# Patient Record
Sex: Female | Born: 1965
Health system: Southern US, Community
[De-identification: ages and names within clinical notes are randomized; demographics above are authoritative.]

## PROBLEM LIST (undated history)

## (undated) DIAGNOSIS — A64 Unspecified sexually transmitted disease: Secondary | ICD-10-CM

## (undated) DIAGNOSIS — Z800 Family history of malignant neoplasm of digestive organs: Secondary | ICD-10-CM

## (undated) DIAGNOSIS — Z803 Family history of malignant neoplasm of breast: Secondary | ICD-10-CM

## (undated) DIAGNOSIS — T7840XA Allergy, unspecified, initial encounter: Secondary | ICD-10-CM

## (undated) DIAGNOSIS — K859 Acute pancreatitis without necrosis or infection, unspecified: Secondary | ICD-10-CM

## (undated) DIAGNOSIS — E785 Hyperlipidemia, unspecified: Secondary | ICD-10-CM

## (undated) DIAGNOSIS — I493 Ventricular premature depolarization: Secondary | ICD-10-CM

## (undated) DIAGNOSIS — I10 Essential (primary) hypertension: Secondary | ICD-10-CM

## (undated) DIAGNOSIS — Z8042 Family history of malignant neoplasm of prostate: Secondary | ICD-10-CM

## (undated) DIAGNOSIS — D649 Anemia, unspecified: Secondary | ICD-10-CM

## (undated) DIAGNOSIS — Z8489 Family history of other specified conditions: Secondary | ICD-10-CM

## (undated) DIAGNOSIS — Z8041 Family history of malignant neoplasm of ovary: Secondary | ICD-10-CM

## (undated) DIAGNOSIS — E041 Nontoxic single thyroid nodule: Secondary | ICD-10-CM

## (undated) DIAGNOSIS — D126 Benign neoplasm of colon, unspecified: Secondary | ICD-10-CM

## (undated) DIAGNOSIS — I2510 Atherosclerotic heart disease of native coronary artery without angina pectoris: Secondary | ICD-10-CM

## (undated) DIAGNOSIS — K219 Gastro-esophageal reflux disease without esophagitis: Secondary | ICD-10-CM

## (undated) DIAGNOSIS — E039 Hypothyroidism, unspecified: Secondary | ICD-10-CM

## (undated) DIAGNOSIS — Z789 Other specified health status: Secondary | ICD-10-CM

## (undated) DIAGNOSIS — Z8051 Family history of malignant neoplasm of kidney: Secondary | ICD-10-CM

## (undated) HISTORY — DX: Family history of malignant neoplasm of digestive organs: Z80.0

## (undated) HISTORY — DX: Other specified health status: Z78.9

## (undated) HISTORY — DX: Family history of malignant neoplasm of prostate: Z80.42

## (undated) HISTORY — DX: Hyperlipidemia, unspecified: E78.5

## (undated) HISTORY — DX: Family history of malignant neoplasm of kidney: Z80.51

## (undated) HISTORY — DX: Nontoxic single thyroid nodule: E04.1

## (undated) HISTORY — PX: COLONOSCOPY: SHX174

## (undated) HISTORY — PX: UPPER GASTROINTESTINAL ENDOSCOPY: SHX188

## (undated) HISTORY — DX: Anemia, unspecified: D64.9

## (undated) HISTORY — DX: Unspecified sexually transmitted disease: A64

## (undated) HISTORY — PX: VENTRICULAR ABLATION SURGERY: SHX835

## (undated) HISTORY — DX: Family history of malignant neoplasm of ovary: Z80.41

## (undated) HISTORY — DX: Ventricular premature depolarization: I49.3

## (undated) HISTORY — DX: Essential (primary) hypertension: I10

## (undated) HISTORY — PX: CORONARY STENT PLACEMENT: SHX6853

## (undated) HISTORY — DX: Allergy, unspecified, initial encounter: T78.40XA

## (undated) HISTORY — DX: Benign neoplasm of colon, unspecified: D12.6

## (undated) HISTORY — DX: Acute pancreatitis without necrosis or infection, unspecified: K85.90

## (undated) HISTORY — PX: CARDIAC CATHETERIZATION: SHX172

## (undated) HISTORY — DX: Family history of malignant neoplasm of breast: Z80.3

## (undated) HISTORY — PX: POLYPECTOMY: SHX149

## (undated) HISTORY — DX: Gastro-esophageal reflux disease without esophagitis: K21.9

## (undated) HISTORY — DX: Atherosclerotic heart disease of native coronary artery without angina pectoris: I25.10

---
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
## 1993-09-26 HISTORY — PX: TUBAL LIGATION: SHX77

## 1999-04-13 ENCOUNTER — Emergency Department (HOSPITAL_COMMUNITY): Admission: EM | Admit: 1999-04-13 | Discharge: 1999-04-13 | Payer: Self-pay | Admitting: Emergency Medicine

## 1999-04-13 ENCOUNTER — Encounter: Payer: Self-pay | Admitting: Emergency Medicine

## 2000-04-13 ENCOUNTER — Encounter: Payer: Self-pay | Admitting: Internal Medicine

## 2000-04-13 ENCOUNTER — Encounter: Admission: RE | Admit: 2000-04-13 | Discharge: 2000-04-13 | Payer: Self-pay | Admitting: Internal Medicine

## 2000-04-14 ENCOUNTER — Ambulatory Visit (HOSPITAL_COMMUNITY): Admission: RE | Admit: 2000-04-14 | Discharge: 2000-04-14 | Payer: Self-pay | Admitting: Internal Medicine

## 2000-06-28 ENCOUNTER — Emergency Department (HOSPITAL_COMMUNITY): Admission: EM | Admit: 2000-06-28 | Discharge: 2000-06-28 | Payer: Self-pay | Admitting: *Deleted

## 2000-08-16 ENCOUNTER — Other Ambulatory Visit: Admission: RE | Admit: 2000-08-16 | Discharge: 2000-08-16 | Payer: Self-pay | Admitting: Obstetrics and Gynecology

## 2002-02-03 ENCOUNTER — Emergency Department (HOSPITAL_COMMUNITY): Admission: EM | Admit: 2002-02-03 | Discharge: 2002-02-03 | Payer: Self-pay | Admitting: Emergency Medicine

## 2002-05-29 ENCOUNTER — Emergency Department (HOSPITAL_COMMUNITY): Admission: EM | Admit: 2002-05-29 | Discharge: 2002-05-29 | Payer: Self-pay | Admitting: Emergency Medicine

## 2002-05-29 ENCOUNTER — Encounter: Payer: Self-pay | Admitting: Emergency Medicine

## 2003-01-26 ENCOUNTER — Encounter: Payer: Self-pay | Admitting: Emergency Medicine

## 2003-01-26 ENCOUNTER — Emergency Department (HOSPITAL_COMMUNITY): Admission: AD | Admit: 2003-01-26 | Discharge: 2003-01-26 | Payer: Self-pay | Admitting: Emergency Medicine

## 2003-02-04 ENCOUNTER — Observation Stay (HOSPITAL_COMMUNITY): Admission: EM | Admit: 2003-02-04 | Discharge: 2003-02-05 | Payer: Self-pay | Admitting: Emergency Medicine

## 2003-02-04 ENCOUNTER — Encounter: Payer: Self-pay | Admitting: Emergency Medicine

## 2003-06-20 ENCOUNTER — Emergency Department (HOSPITAL_COMMUNITY): Admission: EM | Admit: 2003-06-20 | Discharge: 2003-06-20 | Payer: Self-pay | Admitting: Emergency Medicine

## 2003-06-20 ENCOUNTER — Encounter: Payer: Self-pay | Admitting: Emergency Medicine

## 2003-06-23 ENCOUNTER — Emergency Department (HOSPITAL_COMMUNITY): Admission: EM | Admit: 2003-06-23 | Discharge: 2003-06-23 | Payer: Self-pay | Admitting: Emergency Medicine

## 2003-10-31 ENCOUNTER — Other Ambulatory Visit: Admission: RE | Admit: 2003-10-31 | Discharge: 2003-10-31 | Payer: Self-pay | Admitting: Internal Medicine

## 2003-11-04 ENCOUNTER — Encounter: Admission: RE | Admit: 2003-11-04 | Discharge: 2003-11-04 | Payer: Self-pay | Admitting: Internal Medicine

## 2003-11-21 ENCOUNTER — Encounter: Admission: RE | Admit: 2003-11-21 | Discharge: 2003-11-21 | Payer: Self-pay | Admitting: Internal Medicine

## 2004-09-10 ENCOUNTER — Inpatient Hospital Stay (HOSPITAL_COMMUNITY): Admission: EM | Admit: 2004-09-10 | Discharge: 2004-09-14 | Payer: Self-pay | Admitting: Emergency Medicine

## 2004-09-10 ENCOUNTER — Ambulatory Visit: Payer: Self-pay | Admitting: Internal Medicine

## 2004-09-29 ENCOUNTER — Ambulatory Visit: Payer: Self-pay | Admitting: Internal Medicine

## 2004-10-06 ENCOUNTER — Emergency Department (HOSPITAL_COMMUNITY): Admission: EM | Admit: 2004-10-06 | Discharge: 2004-10-06 | Payer: Self-pay | Admitting: Family Medicine

## 2005-06-16 ENCOUNTER — Emergency Department (HOSPITAL_COMMUNITY): Admission: EM | Admit: 2005-06-16 | Discharge: 2005-06-17 | Payer: Self-pay | Admitting: Emergency Medicine

## 2005-07-24 ENCOUNTER — Emergency Department (HOSPITAL_COMMUNITY): Admission: EM | Admit: 2005-07-24 | Discharge: 2005-07-24 | Payer: Self-pay | Admitting: Emergency Medicine

## 2005-07-25 ENCOUNTER — Ambulatory Visit: Payer: Self-pay | Admitting: Internal Medicine

## 2005-08-13 ENCOUNTER — Emergency Department (HOSPITAL_COMMUNITY): Admission: EM | Admit: 2005-08-13 | Discharge: 2005-08-13 | Payer: Self-pay | Admitting: Emergency Medicine

## 2005-08-13 ENCOUNTER — Emergency Department (HOSPITAL_COMMUNITY): Admission: EM | Admit: 2005-08-13 | Discharge: 2005-08-13 | Payer: Self-pay | Admitting: Family Medicine

## 2005-08-15 ENCOUNTER — Emergency Department (HOSPITAL_COMMUNITY): Admission: EM | Admit: 2005-08-15 | Discharge: 2005-08-15 | Payer: Self-pay | Admitting: Family Medicine

## 2005-08-16 ENCOUNTER — Emergency Department (HOSPITAL_COMMUNITY): Admission: EM | Admit: 2005-08-16 | Discharge: 2005-08-16 | Payer: Self-pay | Admitting: Family Medicine

## 2005-09-23 ENCOUNTER — Ambulatory Visit: Payer: Self-pay | Admitting: Internal Medicine

## 2005-11-15 ENCOUNTER — Ambulatory Visit: Payer: Self-pay | Admitting: Internal Medicine

## 2005-11-23 ENCOUNTER — Ambulatory Visit: Payer: Self-pay | Admitting: Internal Medicine

## 2005-12-03 ENCOUNTER — Emergency Department (HOSPITAL_COMMUNITY): Admission: EM | Admit: 2005-12-03 | Discharge: 2005-12-03 | Payer: Self-pay | Admitting: Family Medicine

## 2006-04-22 ENCOUNTER — Emergency Department (HOSPITAL_COMMUNITY): Admission: EM | Admit: 2006-04-22 | Discharge: 2006-04-22 | Payer: Self-pay | Admitting: Emergency Medicine

## 2006-06-16 ENCOUNTER — Emergency Department (HOSPITAL_COMMUNITY): Admission: EM | Admit: 2006-06-16 | Discharge: 2006-06-16 | Payer: Self-pay | Admitting: Family Medicine

## 2006-06-17 ENCOUNTER — Emergency Department (HOSPITAL_COMMUNITY): Admission: EM | Admit: 2006-06-17 | Discharge: 2006-06-17 | Payer: Self-pay | Admitting: Emergency Medicine

## 2006-06-22 ENCOUNTER — Ambulatory Visit: Payer: Self-pay | Admitting: Internal Medicine

## 2006-06-23 ENCOUNTER — Encounter: Admission: RE | Admit: 2006-06-23 | Discharge: 2006-06-23 | Payer: Self-pay | Admitting: Internal Medicine

## 2006-08-22 ENCOUNTER — Encounter (INDEPENDENT_AMBULATORY_CARE_PROVIDER_SITE_OTHER): Payer: Self-pay | Admitting: Specialist

## 2006-08-22 ENCOUNTER — Ambulatory Visit (HOSPITAL_COMMUNITY): Admission: RE | Admit: 2006-08-22 | Discharge: 2006-08-22 | Payer: Self-pay | Admitting: *Deleted

## 2006-08-23 ENCOUNTER — Emergency Department (HOSPITAL_COMMUNITY): Admission: EM | Admit: 2006-08-23 | Discharge: 2006-08-24 | Payer: Self-pay | Admitting: Emergency Medicine

## 2006-09-05 ENCOUNTER — Emergency Department (HOSPITAL_COMMUNITY): Admission: EM | Admit: 2006-09-05 | Discharge: 2006-09-06 | Payer: Self-pay | Admitting: Emergency Medicine

## 2006-09-06 ENCOUNTER — Encounter: Admission: RE | Admit: 2006-09-06 | Discharge: 2006-09-06 | Payer: Self-pay | Admitting: *Deleted

## 2006-09-26 HISTORY — PX: LAPAROSCOPIC CHOLECYSTECTOMY: SUR755

## 2006-12-26 ENCOUNTER — Emergency Department (HOSPITAL_COMMUNITY): Admission: EM | Admit: 2006-12-26 | Discharge: 2006-12-26 | Payer: Self-pay | Admitting: Family Medicine

## 2006-12-28 ENCOUNTER — Emergency Department (HOSPITAL_COMMUNITY): Admission: EM | Admit: 2006-12-28 | Discharge: 2006-12-28 | Payer: Self-pay | Admitting: Family Medicine

## 2007-01-03 ENCOUNTER — Ambulatory Visit: Payer: Self-pay | Admitting: Internal Medicine

## 2007-04-06 ENCOUNTER — Ambulatory Visit: Payer: Self-pay | Admitting: Internal Medicine

## 2007-04-09 ENCOUNTER — Emergency Department (HOSPITAL_COMMUNITY): Admission: EM | Admit: 2007-04-09 | Discharge: 2007-04-09 | Payer: Self-pay | Admitting: Family Medicine

## 2007-04-24 DIAGNOSIS — I10 Essential (primary) hypertension: Secondary | ICD-10-CM

## 2007-07-18 ENCOUNTER — Ambulatory Visit: Payer: Self-pay | Admitting: Internal Medicine

## 2007-07-18 LAB — CONVERTED CEMR LAB
Basophils Absolute: 0 10*3/uL (ref 0.0–0.1)
Bilirubin Urine: NEGATIVE
CO2: 29 meq/L (ref 19–32)
Calcium: 9.2 mg/dL (ref 8.4–10.5)
Chloride: 106 meq/L (ref 96–112)
Creatinine, Ser: 0.8 mg/dL (ref 0.4–1.2)
Eosinophils Absolute: 0.4 10*3/uL (ref 0.0–0.6)
Eosinophils Relative: 5.3 % — ABNORMAL HIGH (ref 0.0–5.0)
Glucose, Bld: 107 mg/dL — ABNORMAL HIGH (ref 70–99)
HCT: 40.2 % (ref 36.0–46.0)
Hemoglobin, Urine: NEGATIVE
Hemoglobin: 13.9 g/dL (ref 12.0–15.0)
MCHC: 34.7 g/dL (ref 30.0–36.0)
MCV: 89.4 fL (ref 78.0–100.0)
Monocytes Absolute: 0.5 10*3/uL (ref 0.2–0.7)
Neutrophils Relative %: 53.9 % (ref 43.0–77.0)
Nitrite: NEGATIVE
RBC: 4.49 M/uL (ref 3.87–5.11)
RDW: 12.6 % (ref 11.5–14.6)
Sodium: 139 meq/L (ref 135–145)
Total Protein, Urine: NEGATIVE mg/dL
WBC: 6.8 10*3/uL (ref 4.5–10.5)

## 2007-07-19 ENCOUNTER — Telehealth: Payer: Self-pay | Admitting: Internal Medicine

## 2007-08-28 ENCOUNTER — Ambulatory Visit: Payer: Self-pay | Admitting: Internal Medicine

## 2007-08-28 DIAGNOSIS — R1310 Dysphagia, unspecified: Secondary | ICD-10-CM | POA: Insufficient documentation

## 2007-08-28 DIAGNOSIS — R0602 Shortness of breath: Secondary | ICD-10-CM | POA: Insufficient documentation

## 2007-08-29 ENCOUNTER — Ambulatory Visit: Payer: Self-pay | Admitting: Internal Medicine

## 2007-08-30 LAB — CONVERTED CEMR LAB
BUN: 8 mg/dL (ref 6–23)
Basophils Absolute: 0 10*3/uL (ref 0.0–0.1)
Basophils Relative: 0.4 % (ref 0.0–1.0)
CO2: 29 meq/L (ref 19–32)
Eosinophils Absolute: 0.3 10*3/uL (ref 0.0–0.6)
GFR calc Af Amer: 102 mL/min
GFR calc non Af Amer: 84 mL/min
Hemoglobin: 13.5 g/dL (ref 12.0–15.0)
Lymphocytes Relative: 35 % (ref 12.0–46.0)
MCHC: 34.3 g/dL (ref 30.0–36.0)
MCV: 89.4 fL (ref 78.0–100.0)
Monocytes Absolute: 0.6 10*3/uL (ref 0.2–0.7)
Monocytes Relative: 6.8 % (ref 3.0–11.0)
Neutro Abs: 4.8 10*3/uL (ref 1.4–7.7)
Potassium: 4.3 meq/L (ref 3.5–5.1)

## 2007-09-17 ENCOUNTER — Telehealth: Payer: Self-pay | Admitting: Internal Medicine

## 2007-09-17 DIAGNOSIS — K219 Gastro-esophageal reflux disease without esophagitis: Secondary | ICD-10-CM | POA: Insufficient documentation

## 2007-09-18 ENCOUNTER — Encounter (INDEPENDENT_AMBULATORY_CARE_PROVIDER_SITE_OTHER): Payer: Self-pay | Admitting: *Deleted

## 2007-09-23 ENCOUNTER — Emergency Department (HOSPITAL_COMMUNITY): Admission: EM | Admit: 2007-09-23 | Discharge: 2007-09-23 | Payer: Self-pay | Admitting: Emergency Medicine

## 2007-09-27 DIAGNOSIS — I2510 Atherosclerotic heart disease of native coronary artery without angina pectoris: Secondary | ICD-10-CM

## 2007-09-27 HISTORY — DX: Atherosclerotic heart disease of native coronary artery without angina pectoris: I25.10

## 2007-09-28 ENCOUNTER — Ambulatory Visit: Payer: Self-pay | Admitting: Internal Medicine

## 2007-09-28 LAB — CONVERTED CEMR LAB: TSH: 1.47 microintl units/mL (ref 0.35–5.50)

## 2007-10-02 ENCOUNTER — Ambulatory Visit: Payer: Self-pay | Admitting: Internal Medicine

## 2007-10-02 DIAGNOSIS — R1031 Right lower quadrant pain: Secondary | ICD-10-CM

## 2008-01-31 ENCOUNTER — Ambulatory Visit: Payer: Self-pay | Admitting: Internal Medicine

## 2008-01-31 ENCOUNTER — Telehealth: Payer: Self-pay | Admitting: Internal Medicine

## 2008-01-31 DIAGNOSIS — M549 Dorsalgia, unspecified: Secondary | ICD-10-CM | POA: Insufficient documentation

## 2008-01-31 LAB — CONVERTED CEMR LAB
BUN: 9 mg/dL (ref 6–23)
Chloride: 105 meq/L (ref 96–112)
Glucose, Bld: 95 mg/dL (ref 70–99)
Ketones, ur: NEGATIVE mg/dL
Potassium: 3.7 meq/L (ref 3.5–5.1)
Specific Gravity, Urine: 1.01 (ref 1.000–1.03)
Urine Glucose: NEGATIVE mg/dL
Urobilinogen, UA: 0.2 (ref 0.0–1.0)

## 2008-02-05 ENCOUNTER — Telehealth: Payer: Self-pay | Admitting: Internal Medicine

## 2008-02-08 ENCOUNTER — Ambulatory Visit: Payer: Self-pay | Admitting: Internal Medicine

## 2008-02-08 ENCOUNTER — Emergency Department (HOSPITAL_COMMUNITY): Admission: EM | Admit: 2008-02-08 | Discharge: 2008-02-08 | Payer: Self-pay | Admitting: Emergency Medicine

## 2008-02-08 LAB — CONVERTED CEMR LAB
Bacteria, UA: NEGATIVE
Ketones, ur: NEGATIVE mg/dL
Mucus, UA: NEGATIVE
RBC / HPF: NONE SEEN
Specific Gravity, Urine: 1.02 (ref 1.000–1.03)
Urine Glucose: NEGATIVE mg/dL
pH: 6 (ref 5.0–8.0)

## 2008-02-20 ENCOUNTER — Encounter: Payer: Self-pay | Admitting: Endocrinology

## 2008-04-18 ENCOUNTER — Ambulatory Visit: Payer: Self-pay | Admitting: Internal Medicine

## 2008-04-18 ENCOUNTER — Inpatient Hospital Stay (HOSPITAL_COMMUNITY): Admission: EM | Admit: 2008-04-18 | Discharge: 2008-04-20 | Payer: Self-pay | Admitting: Emergency Medicine

## 2008-06-30 ENCOUNTER — Encounter: Payer: Self-pay | Admitting: Endocrinology

## 2008-07-02 ENCOUNTER — Encounter: Payer: Self-pay | Admitting: Endocrinology

## 2008-08-08 ENCOUNTER — Ambulatory Visit: Payer: Self-pay | Admitting: Internal Medicine

## 2008-08-08 LAB — CONVERTED CEMR LAB
ALT: 17 units/L (ref 0–35)
AST: 18 units/L (ref 0–37)
Albumin: 3.9 g/dL (ref 3.5–5.2)
BUN: 12 mg/dL (ref 6–23)
Basophils Relative: 0 % (ref 0.0–3.0)
Bilirubin Urine: NEGATIVE
CO2: 30 meq/L (ref 19–32)
Calcium: 9.4 mg/dL (ref 8.4–10.5)
Chloride: 105 meq/L (ref 96–112)
Cholesterol: 160 mg/dL (ref 0–200)
Creatinine, Ser: 0.8 mg/dL (ref 0.4–1.2)
Eosinophils Relative: 5 % (ref 0.0–5.0)
Hemoglobin: 12.9 g/dL (ref 12.0–15.0)
Ketones, urine, test strip: NEGATIVE
LDL Cholesterol: 100 mg/dL — ABNORMAL HIGH (ref 0–99)
Lipase: 19 units/L (ref 11.0–59.0)
Lymphocytes Relative: 40 % (ref 12.0–46.0)
MCV: 89.1 fL (ref 78.0–100.0)
Neutro Abs: 4 10*3/uL (ref 1.4–7.7)
Neutrophils Relative %: 49.6 % (ref 43.0–77.0)
Nitrite: NEGATIVE
RBC: 4.19 M/uL (ref 3.87–5.11)
Total Protein: 7.3 g/dL (ref 6.0–8.3)
VLDL: 16 mg/dL (ref 0–40)
WBC Urine, dipstick: NEGATIVE
WBC: 8 10*3/uL (ref 4.5–10.5)
pH: 6

## 2008-08-10 ENCOUNTER — Encounter: Payer: Self-pay | Admitting: Internal Medicine

## 2008-09-23 ENCOUNTER — Telehealth: Payer: Self-pay | Admitting: Internal Medicine

## 2008-09-26 DIAGNOSIS — E041 Nontoxic single thyroid nodule: Secondary | ICD-10-CM

## 2008-09-26 HISTORY — DX: Nontoxic single thyroid nodule: E04.1

## 2008-10-02 ENCOUNTER — Encounter: Payer: Self-pay | Admitting: Internal Medicine

## 2008-10-06 ENCOUNTER — Encounter: Payer: Self-pay | Admitting: Internal Medicine

## 2008-10-16 ENCOUNTER — Encounter: Payer: Self-pay | Admitting: Endocrinology

## 2008-10-17 ENCOUNTER — Encounter: Payer: Self-pay | Admitting: Endocrinology

## 2008-11-15 ENCOUNTER — Emergency Department (HOSPITAL_COMMUNITY): Admission: EM | Admit: 2008-11-15 | Discharge: 2008-11-15 | Payer: Self-pay | Admitting: Emergency Medicine

## 2008-11-17 ENCOUNTER — Ambulatory Visit: Payer: Self-pay | Admitting: Endocrinology

## 2008-12-15 ENCOUNTER — Telehealth: Payer: Self-pay | Admitting: Internal Medicine

## 2008-12-31 ENCOUNTER — Ambulatory Visit: Payer: Self-pay | Admitting: Internal Medicine

## 2008-12-31 DIAGNOSIS — M79609 Pain in unspecified limb: Secondary | ICD-10-CM

## 2008-12-31 DIAGNOSIS — R209 Unspecified disturbances of skin sensation: Secondary | ICD-10-CM

## 2008-12-31 DIAGNOSIS — E785 Hyperlipidemia, unspecified: Secondary | ICD-10-CM

## 2008-12-31 DIAGNOSIS — Z8719 Personal history of other diseases of the digestive system: Secondary | ICD-10-CM | POA: Insufficient documentation

## 2008-12-31 DIAGNOSIS — R609 Edema, unspecified: Secondary | ICD-10-CM

## 2008-12-31 DIAGNOSIS — Z87898 Personal history of other specified conditions: Secondary | ICD-10-CM | POA: Insufficient documentation

## 2008-12-31 LAB — CONVERTED CEMR LAB: Vit D, 25-Hydroxy: 18 ng/mL — ABNORMAL LOW (ref 30–89)

## 2009-01-02 LAB — CONVERTED CEMR LAB
ALT: 15 units/L (ref 0–35)
AST: 20 units/L (ref 0–37)
Bilirubin Urine: NEGATIVE
CO2: 30 meq/L (ref 19–32)
Chloride: 106 meq/L (ref 96–112)
Creatinine, Ser: 0.9 mg/dL (ref 0.4–1.2)
Glucose, Bld: 103 mg/dL — ABNORMAL HIGH (ref 70–99)
Hemoglobin, Urine: NEGATIVE
Ketones, ur: NEGATIVE mg/dL
TSH: 0.31 microintl units/mL — ABNORMAL LOW (ref 0.35–5.50)
Total Bilirubin: 0.7 mg/dL (ref 0.3–1.2)
Total Protein, Urine: NEGATIVE mg/dL
Urine Glucose: NEGATIVE mg/dL

## 2009-01-07 ENCOUNTER — Telehealth (INDEPENDENT_AMBULATORY_CARE_PROVIDER_SITE_OTHER): Payer: Self-pay | Admitting: *Deleted

## 2009-02-19 ENCOUNTER — Encounter (INDEPENDENT_AMBULATORY_CARE_PROVIDER_SITE_OTHER): Payer: Self-pay | Admitting: *Deleted

## 2009-03-06 ENCOUNTER — Ambulatory Visit: Payer: Self-pay | Admitting: Internal Medicine

## 2009-03-11 ENCOUNTER — Encounter: Payer: Self-pay | Admitting: Endocrinology

## 2009-03-17 ENCOUNTER — Ambulatory Visit: Payer: Self-pay | Admitting: Internal Medicine

## 2009-04-30 ENCOUNTER — Encounter: Payer: Self-pay | Admitting: Endocrinology

## 2009-05-04 ENCOUNTER — Ambulatory Visit: Payer: Self-pay | Admitting: Endocrinology

## 2009-05-04 DIAGNOSIS — E041 Nontoxic single thyroid nodule: Secondary | ICD-10-CM | POA: Insufficient documentation

## 2009-05-11 ENCOUNTER — Encounter (HOSPITAL_COMMUNITY): Admission: RE | Admit: 2009-05-11 | Discharge: 2009-06-25 | Payer: Self-pay | Admitting: Endocrinology

## 2009-05-27 ENCOUNTER — Ambulatory Visit: Payer: Self-pay | Admitting: Internal Medicine

## 2009-05-27 DIAGNOSIS — M545 Low back pain: Secondary | ICD-10-CM

## 2009-05-27 LAB — CONVERTED CEMR LAB
BUN: 9 mg/dL (ref 6–23)
Basophils Absolute: 0.1 10*3/uL (ref 0.0–0.1)
Bilirubin Urine: NEGATIVE
Bilirubin, Direct: 0.1 mg/dL (ref 0.0–0.3)
CO2: 30 meq/L (ref 19–32)
Chloride: 108 meq/L (ref 96–112)
Creatinine, Ser: 1 mg/dL (ref 0.4–1.2)
Eosinophils Absolute: 0.2 10*3/uL (ref 0.0–0.7)
Leukocytes, UA: NEGATIVE
MCHC: 33.3 g/dL (ref 30.0–36.0)
MCV: 93.8 fL (ref 78.0–100.0)
Monocytes Absolute: 0.3 10*3/uL (ref 0.1–1.0)
Neutrophils Relative %: 56.1 % (ref 43.0–77.0)
Nitrite: NEGATIVE
Platelets: 239 10*3/uL (ref 150.0–400.0)
Sed Rate: 21 mm/hr (ref 0–22)
Specific Gravity, Urine: 1.02 (ref 1.000–1.030)
Total Bilirubin: 0.9 mg/dL (ref 0.3–1.2)
WBC: 7.1 10*3/uL (ref 4.5–10.5)
pH: 6 (ref 5.0–8.0)

## 2009-06-12 ENCOUNTER — Encounter: Payer: Self-pay | Admitting: Endocrinology

## 2009-06-12 ENCOUNTER — Telehealth (INDEPENDENT_AMBULATORY_CARE_PROVIDER_SITE_OTHER): Payer: Self-pay | Admitting: *Deleted

## 2009-06-26 ENCOUNTER — Ambulatory Visit (HOSPITAL_COMMUNITY): Admission: RE | Admit: 2009-06-26 | Discharge: 2009-06-26 | Payer: Self-pay | Admitting: Endocrinology

## 2009-09-26 DIAGNOSIS — K859 Acute pancreatitis without necrosis or infection, unspecified: Secondary | ICD-10-CM

## 2009-09-26 HISTORY — DX: Acute pancreatitis without necrosis or infection, unspecified: K85.90

## 2009-10-22 ENCOUNTER — Ambulatory Visit: Payer: Self-pay | Admitting: Internal Medicine

## 2009-10-22 ENCOUNTER — Encounter: Payer: Self-pay | Admitting: Internal Medicine

## 2009-10-23 ENCOUNTER — Telehealth: Payer: Self-pay | Admitting: Internal Medicine

## 2010-05-20 ENCOUNTER — Ambulatory Visit: Payer: Self-pay | Admitting: Endocrinology

## 2010-06-10 ENCOUNTER — Encounter: Payer: Self-pay | Admitting: Internal Medicine

## 2010-06-11 ENCOUNTER — Encounter: Payer: Self-pay | Admitting: Internal Medicine

## 2010-06-16 ENCOUNTER — Ambulatory Visit: Payer: Self-pay | Admitting: Internal Medicine

## 2010-06-16 DIAGNOSIS — R7309 Other abnormal glucose: Secondary | ICD-10-CM | POA: Insufficient documentation

## 2010-06-16 DIAGNOSIS — K859 Acute pancreatitis without necrosis or infection, unspecified: Secondary | ICD-10-CM

## 2010-06-16 LAB — CONVERTED CEMR LAB
ALT: 13 units/L (ref 0–35)
AST: 14 units/L (ref 0–37)
Alkaline Phosphatase: 44 units/L (ref 39–117)
BUN: 9 mg/dL (ref 6–23)
Bilirubin, Direct: 0.2 mg/dL (ref 0.0–0.3)
Calcium: 9.6 mg/dL (ref 8.4–10.5)
Eosinophils Relative: 3.3 % (ref 0.0–5.0)
GFR calc non Af Amer: 109.54 mL/min (ref >60)
HCT: 40.3 % (ref 36.0–46.0)
Lipase: 41 units/L (ref 11.0–59.0)
Lymphocytes Relative: 37.6 % (ref 12.0–46.0)
Lymphs Abs: 2.6 10*3/uL (ref 0.7–4.0)
Monocytes Relative: 4.5 % (ref 3.0–12.0)
Platelets: 251 10*3/uL (ref 150.0–400.0)
Potassium: 4.9 meq/L (ref 3.5–5.1)
Sodium: 140 meq/L (ref 135–145)
Total Bilirubin: 0.9 mg/dL (ref 0.3–1.2)
WBC: 6.8 10*3/uL (ref 4.5–10.5)

## 2010-06-30 ENCOUNTER — Telehealth (INDEPENDENT_AMBULATORY_CARE_PROVIDER_SITE_OTHER): Payer: Self-pay | Admitting: *Deleted

## 2010-09-09 ENCOUNTER — Ambulatory Visit: Payer: Self-pay | Admitting: Internal Medicine

## 2010-09-10 ENCOUNTER — Telehealth: Payer: Self-pay | Admitting: Internal Medicine

## 2010-09-13 LAB — CONVERTED CEMR LAB
BUN: 11 mg/dL (ref 6–23)
Bilirubin Urine: NEGATIVE
Bilirubin, Direct: 0.1 mg/dL (ref 0.0–0.3)
CO2: 29 meq/L (ref 19–32)
Chloride: 105 meq/L (ref 96–112)
Creatinine, Ser: 0.7 mg/dL (ref 0.4–1.2)
Eosinophils Absolute: 0.2 10*3/uL (ref 0.0–0.7)
Glucose, Bld: 95 mg/dL (ref 70–99)
HCT: 40.2 % (ref 36.0–46.0)
Ketones, ur: NEGATIVE mg/dL
Leukocytes, UA: NEGATIVE
Lipase: 29 units/L (ref 11.0–59.0)
Lymphs Abs: 2.5 10*3/uL (ref 0.7–4.0)
MCHC: 33.8 g/dL (ref 30.0–36.0)
MCV: 93.5 fL (ref 78.0–100.0)
Monocytes Absolute: 0.4 10*3/uL (ref 0.1–1.0)
Neutrophils Relative %: 56.5 % (ref 43.0–77.0)
Nitrite: NEGATIVE
Platelets: 262 10*3/uL (ref 150.0–400.0)
RDW: 13.4 % (ref 11.5–14.6)
Specific Gravity, Urine: 1.02 (ref 1.000–1.030)
TSH: 2.33 microintl units/mL (ref 0.35–5.50)
Total Bilirubin: 0.8 mg/dL (ref 0.3–1.2)
Urobilinogen, UA: 1 (ref 0.0–1.0)
pH: 6 (ref 5.0–8.0)

## 2010-10-17 ENCOUNTER — Encounter: Payer: Self-pay | Admitting: Internal Medicine

## 2010-10-26 NOTE — Assessment & Plan Note (Signed)
Summary: DR AVP PT NO SLOT-COUGH CHEST CONGESTION STC   Vital Signs:  Patient profile:   45 year old female Height:      62 inches Weight:      177 pounds BMI:     32.49 O2 Sat:      98 % on Room air Temp:     97.9 degrees F oral Pulse rate:   112 / minute Pulse rhythm:   regular Resp:     16 per minute BP sitting:   140 / 88  (left arm) Cuff size:   large  Vitals Entered By: Rock Nephew CMA (October 22, 2009 1:58 PM)  O2 Flow:  Room air  Primary Care Provider:  Georgina Quint Plotnikov MD  CC:  URI symptoms.  History of Present Illness:  URI Symptoms      This is a 45 year old woman who presents with URI symptoms.  The symptoms began 1 week ago.  The severity is described as moderate.  The patient reports nasal congestion, purulent nasal discharge, sore throat, productive cough, and sick contacts, but denies earache.  Associated symptoms include fever of 100.5-103 degrees, use of an antipyretic, and response to antipyretic.  The patient denies stiff neck, dyspnea, wheezing, rash, vomiting, and diarrhea.  The patient also reports headache, muscle aches, and severe fatigue.  The patient denies the following risk factors for Strep sinusitis: Strep exposure, tender adenopathy, and absence of cough.    Preventive Screening-Counseling & Management  Alcohol-Tobacco     Alcohol drinks/day: 0     Smoking Status: never     Passive Smoke Exposure: no  Caffeine-Diet-Exercise     Does Patient Exercise: yes  Hep-HIV-STD-Contraception     HIV Risk: no      Drug Use:  no.    Medications Prior to Update: 1)  Diltiazem Hcl Er Beads 120 Mg  Cp24 (Diltiazem Hcl Er Beads) .... Take 1 Tablet By Mouth Once A Day 2)  Atenolol 50 Mg Tabs (Atenolol) 3)  Vitamin D3 1000 Unit  Tabs (Cholecalciferol) .Marland Kitchen.. 1 By Mouth Daily 4)  Veramyst 27.5 Mcg/spray Susp (Fluticasone Furoate) 5)  Promethazine Hcl 25 Mg  Tabs (Promethazine Hcl) .Marland Kitchen.. 1 By Mouth Two Times A Day As Needed Nausea 6)   Hydrocodone-Acetaminophen 10-325 Mg Tabs (Hydrocodone-Acetaminophen) .... 1/2 or 1 By Mouth Up To 4 Time Per Day As Needed For Pain  Current Medications (verified): 1)  Atenolol 50 Mg Tabs (Atenolol) 2)  Vitamin D3 1000 Unit  Tabs (Cholecalciferol) .Marland Kitchen.. 1 By Mouth Daily 3)  Veramyst 27.5 Mcg/spray Susp (Fluticasone Furoate) 4)  Promethazine Hcl 25 Mg  Tabs (Promethazine Hcl) .Marland Kitchen.. 1 By Mouth Two Times A Day As Needed Nausea  Allergies (verified): 1)  Lipitor  Past History:  Past Medical History: Reviewed history from 12/31/2008 and no changes required. Hypertension gyn  Dr Allena Katz in [facility] Heart cath 2009 - CAD Dr Linton Ham - [facility] Coronary artery disease Hyperlipidemia Statin intol  Past Surgical History: Reviewed history from 04/24/2007 and no changes required. Tubal ligation  Family History: Reviewed history from 08/28/2007 and no changes required. Family History Hypertension Family History of Prostate CA 1st degree relative <50 D, S ovar.  CA  Social History: Reviewed history from 08/08/2008 and no changes required. 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    Physical Exam  General:  alert, well-developed, well-nourished, well-hydrated, and mild distress with cough.   Head:  normocephalic, atraumatic, no abnormalities observed, and no abnormalities palpated.   Eyes:  vision grossly intact, pupils equal, pupils round, and pupils reactive to light.   Ears:  R ear normal and L ear normal.   Nose:  nasal discharge, mucosal erythema, and mucosal edema.  no airflow obstruction, no intranasal foreign body, no nasal polyps, no nasal mucosal lesions, no mucosal friability, no active bleeding or clots, nasal dischargemucosal pallor,  mucosal erythema, and mucosal edema.   Mouth:  good dentition, no gingival abnormalities, no exudates, no posterior lymphoid hypertrophy, no postnasal drip, no pharyngeal crowing, no lesions, no erosions, no leukoplakia, no petechiae, and pharyngeal erythema.   Neck:  supple, full ROM, no masses, no thyromegaly, no thyroid nodules or tenderness, and no cervical lymphadenopathy.   Lungs:  normal respiratory effort, no intercostal retractions, no accessory muscle use, normal breath sounds, no dullness, no fremitus, no crackles, and no wheezes.   Heart:  normal rate, regular rhythm, no murmur, no gallop, no rub, and no JVD.   Abdomen:  Bowel sounds positive,abdomen soft and non-tender without masses, organomegaly or hernias noted. Msk:  No deformity or scoliosis noted of thoracic or lumbar spine.   Pulses:  R and L carotid,radial,femoral,dorsalis pedis and posterior tibial pulses are full and equal bilaterally Extremities:  No clubbing, cyanosis, edema, or deformity noted with normal full range of motion of all joints.   Neurologic:  No cranial nerve deficits noted. Station and gait are normal. Plantar reflexes are down-going bilaterally. DTRs are symmetrical throughout. Sensory, motor and coordinative functions appear intact. Skin:  Intact without suspicious lesions or rashes Cervical Nodes:  no anterior cervical adenopathy and no posterior cervical adenopathy.   Axillary Nodes:  no R axillary adenopathy and no L axillary adenopathy.   Psych:  Cognition and judgment appear intact. Alert and cooperative with normal attention span and concentration. No apparent delusions, illusions, hallucinations   Impression & Recommendations:  Problem # 1:  BRONCHITIS-ACUTE (ICD-466.0) Assessment New  Her updated medication list for this problem includes:    Tussionex Pennkinetic Er 8-10 Mg/29ml Lqcr (Chlorpheniramine-hydrocodone) .Marland KitchenMarland KitchenMarland KitchenMarland Kitchen 5ml by mouth two times a day as needed for cough    Zithromax Tri-pak 500 Mg  Tab (Azithromycin) .Marland Kitchen... Take as directed once daily for 3 days  Take antibiotics and other medications as directed. Encouraged to push clear liquids, get enough rest, and take acetaminophen as needed. To be seen in 5-7 days if no improvement, sooner if worse.  Problem # 2:  COUGH (ICD-786.2) Assessment: New  Orders: T-2 View CXR (71020TC)  Complete Medication List: 1)  Atenolol 50 Mg Tabs (Atenolol) 2)  Vitamin D3 1000 Unit Tabs (Cholecalciferol) .Marland Kitchen.. 1 by mouth daily 3)  Veramyst 27.5 Mcg/spray Susp (Fluticasone furoate) 4)  Promethazine Hcl 25 Mg Tabs (Promethazine hcl) .Marland Kitchen.. 1 by mouth two times a day as needed nausea 5)  Tussionex Pennkinetic Er 8-10 Mg/57ml Lqcr (Chlorpheniramine-hydrocodone) .... 5ml by mouth two times a day as needed for cough 6)  Zithromax Tri-pak 500 Mg Tab (Azithromycin) .... Take as directed once daily for 3 days  Patient Instructions: 1)  Please schedule a follow-up appointment in 2 weeks. 2)  Take your antibiotic as prescribed until ALL of it is gone, but stop if you develop a rash or swelling and contact our office as soon as possible. 3)  Acute bronchitis symptoms for less  than 10 days are not helped by antibiotics. take over the counter cough medications. call if no improvment in  5-7 days, sooner if increasing cough, fever, or new symptoms( shortness of breath, chest pain). Prescriptions: ZITHROMAX TRI-PAK 500 MG TAB (AZITHROMYCIN) Take as directed once daily for 3 days  #3 x 0   Entered and Authorized by:   Etta Grandchild MD   Signed by:   Etta Grandchild MD on 10/22/2009   Method used:   Print then Give to Patient   RxID:   [id number] TUSSIONEX PENNKINETIC ER 8-10 MG/5ML LQCR (CHLORPHENIRAMINE-HYDROCODONE) 5ml by mouth two times a day as needed for cough  #4 ounces x 0   Entered and Authorized by:   Etta Grandchild MD   Signed by:   Etta Grandchild MD on 10/22/2009   Method used:   Print then Give to Patient   RxID:   [id number]

## 2010-10-26 NOTE — Letter (Signed)
Summary: Out of Work  [facility]  [address] [place], [place] [address]   Phone: [phone]  Fax: [phone]    October 22, 2009   Employee:  STEFFI NOVIELLO    To Whom It May Concern:   For Medical reasons, please excuse the above named employee from work for the following dates:  Start:   10/22/2009  End:   10/26/2009  If you need additional information, please feel free to contact our office.         Sincerely,    Alvy Beal A CMA Dr. Sanda Linger

## 2010-10-26 NOTE — Progress Notes (Signed)
Summary: RESULTS  Phone Note Call from Patient   Summary of Call: Patient is requesting results of xray Initial call taken by: Lamar Sprinkles, CMA,  October 23, 2009 3:18 PM  Follow-up for Phone Call        normal Follow-up by: Etta Grandchild MD,  October 23, 2009 3:19 PM  Additional Follow-up for Phone Call Additional follow up Details #1::        Pt informed  Additional Follow-up by: Lamar Sprinkles, CMA,  October 23, 2009 3:44 PM

## 2010-10-26 NOTE — Assessment & Plan Note (Signed)
Summary: fu--stc   Vital Signs:  Patient profile:   45 year old female Height:      62 inches (157.48 cm) Weight:      174.13 pounds (79.15 kg) BMI:     31.96 O2 Sat:      98 % on Room air Temp:     98.6 degrees F (37.00 degrees C) oral Pulse rate:   90 / minute BP sitting:   128 / 80  (left arm) Cuff size:   large  Vitals Entered By: Brenton Grills MA (May 20, 2010 10:09 AM)  O2 Flow:  Room air CC: F/U on thyroid/pt is no longer taking Veramyst, Promethazine, or Tussionex/aj   Primary Provider:  Tresa Garter MD  CC:  F/U on thyroid/pt is no longer taking Veramyst, Promethazine, and or Tussionex/aj.  History of Present Illness: pt had i-131 rx for hyperthyroidism in 2010.  she says she forgot to ret for f/u.  pt states she feels well in general.    Current Medications (verified): 1)  Atenolol 50 Mg Tabs (Atenolol) 2)  Vitamin D3 1000 Unit  Tabs (Cholecalciferol) .Marland Kitchen.. 1 By Mouth Daily 3)  Veramyst 27.5 Mcg/spray Susp (Fluticasone Furoate) 4)  Promethazine Hcl 25 Mg  Tabs (Promethazine Hcl) .Marland Kitchen.. 1 By Mouth Two Times A Day As Needed Nausea 5)  Tussionex Pennkinetic Er 8-10 Mg/23ml Lqcr (Chlorpheniramine-Hydrocodone) .... 5ml By Mouth Two Times A Day As Needed For Cough  Allergies (verified): 1)  Lipitor  Past History:  Past Medical History: Last updated: 12/31/2008 Hypertension gyn  Dr Allena Katz in [facility] Heart cath 2009 - CAD Dr Linton Ham - [facility] Coronary artery disease Hyperlipidemia Statin intol  Review of Systems       she has lost a few lbs, due to her efforts  Physical Exam  General:  normal appearance.   Neck:  thyroid is nonpalpable Additional Exam:   FastTSH                   1.59 uIU/mL     Impression & Recommendations:  Problem # 1:  THYROID NODULE, RIGHT (ICD-241.0) Assessment Improved  Problem # 2:  HYPERTHYROIDISM (ICD-242.90) Assessment: Improved  Other Orders: TLB-TSH (Thyroid Stimulating Hormone) (84443-TSH) Est. Patient Level  III (45409)    Patient Instructions: 1)  blood tests are being ordered for you today.  please call [phone] to hear your test results. 2)  (update: i left message on phone-tree:  normal.  no rx needed now.  ret 6 mos).

## 2010-10-26 NOTE — Assessment & Plan Note (Signed)
Summary: post hosp fu--pancrease problem--stc   Vital Signs:  Patient profile:   45 year old female Height:      62 inches Weight:      167 pounds BMI:     30.66 O2 Sat:      98 % on Room air Temp:     97.2 degrees F oral Pulse rate:   48 / minute BP sitting:   115 / 80  (left arm) Cuff size:   regular  Vitals Entered By: Alysia Penna (June 16, 2010 4:12 PM)  O2 Flow:  Room air CC: pt here for f/u hospital visit. still being irritated when eating. /cp sma   Primary Care Provider:  Tresa Garter MD  CC:  pt here for f/u hospital visit. still being irritated when eating. /cp sma.  History of Present Illness: The patient presents for a post-hospital visit for abd pain palpitations and pancreatitis. F/u HTN.  She had elev. sugar while hospitalized in [facility]  Current Medications (verified): 1)  Atenolol 50 Mg Tabs (Atenolol) 2)  Vitamin D3 1000 Unit  Tabs (Cholecalciferol) .Marland Kitchen.. 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    Physical Exam  General:  alert, well-developed, well-nourished, well-hydrated, and mild distress with cough.   Head:  normocephalic, atraumatic, no abnormalities observed, and no abnormalities palpated.   Mouth:  good dentition, no gingival abnormalities, no exudates, no posterior lymphoid hypertrophy, no postnasal drip, no pharyngeal crowing, no lesions, no erosions, no leukoplakia, no petechiae, and pharyngeal  erythema.   Neck:  supple, full ROM, no masses, no thyromegaly, no thyroid nodules or tenderness, and no cervical lymphadenopathy.   Lungs:  normal respiratory effort, no intercostal retractions, no accessory muscle use, normal breath sounds, no dullness, no fremitus, no crackles, and no wheezes.   Heart:  normal rate, regular rhythm, no murmur, no gallop, no rub, and no JVD.   Abdomen:  epig area is sensitive Msk:  No deformity or scoliosis noted of thoracic or lumbar spine.   Extremities:  No clubbing, cyanosis, edema, or deformity noted with normal full range of motion of all joints.   Neurologic:  No cranial nerve deficits noted. Station and gait are normal. Plantar reflexes are down-going bilaterally. DTRs are symmetrical throughout. Sensory, motor and coordinative functions appear intact. Skin:  Intact without suspicious lesions or rashes Psych:  Cognition and judgment appear intact. Alert and cooperative with normal attention span and concentration. No apparent delusions, illusions, hallucinations   Impression & Recommendations:  Problem # 1:  ACUTE PANCREATITIS (ICD-577.0) Assessment New Get labs Get records  Problem # 2:  HYPERLIPIDEMIA (ICD-272.4) Assessment: Improved  Problem # 3:  HYPERGLYCEMIA (ICD-790.29) Assessment: New  Orders: TLB-BMP (Basic Metabolic Panel-BMET) (80048-METABOL) TLB-CBC Platelet - w/Differential (85025-CBCD) TLB-Hepatic/Liver Function Pnl (80076-HEPATIC) TLB-Lipase (83690-LIPASE)  Problem # 4:  CORONARY ARTERY DISEASE (ICD-414.00) Assessment: Unchanged  Orders: TLB-BMP (Basic Metabolic Panel-BMET) (80048-METABOL) TLB-CBC Platelet - w/Differential (85025-CBCD) TLB-Hepatic/Liver Function Pnl (80076-HEPATIC) TLB-Lipase (83690-LIPASE)  Her updated medication list for this problem includes:    Atenolol  50 Mg Tabs (Atenolol) .Marland Kitchen... 1 by mouth qd  Complete Medication List: 1)  Atenolol 50 Mg Tabs (Atenolol) .Marland Kitchen.. 1 by mouth qd 2)  Vitamin D3 1000  Unit Tabs (Cholecalciferol) .Marland Kitchen.. 1 by mouth daily 3)  Ranitidine Hcl 150 Mg Tabs (Ranitidine hcl) .Marland Kitchen.. 1 by mouth two times a day for indigestion  Patient Instructions: 1)  Please get d/c summary, Xrays and labs from [facility] 2)  Please schedule a follow-up appointment in 3 months. Prescriptions: ATENOLOL 50 MG TABS (ATENOLOL) 1 by mouth qd  #90 x 3   Entered and Authorized by:   Tresa Garter MD   Signed by:   Tresa Garter MD on 06/16/2010   Method used:   Electronically to        [facility]* (retail)       [address]       [place], [place]  [address]       Ph: [phone]       Fax: [phone]   RxID:   [id number] RANITIDINE HCL 150 MG TABS (RANITIDINE HCL) 1 by mouth two times a day for indigestion  #180 x 3   Entered and Authorized by:   Tresa Garter MD   Signed by:   Tresa Garter MD on 06/16/2010   Method used:   Electronically to        [facility]. RX* (retail)       [address]       [place], [place]  [address]       Ph: [phone]       Fax: [phone]   RxID:   [id number]

## 2010-10-26 NOTE — Progress Notes (Signed)
  Phone Note Other Incoming   Request: Send information Summary of Call: Records received from [facility]. 32 pages forwarded to Dr. Posey Rea for review.

## 2010-10-26 NOTE — Letter (Signed)
Summary: [facility]  [facility]   Imported By: Sherian Rein 07/15/2010 10:53:49  _____________________________________________________________________  External Attachment:    Type:   Image     Comment:   External Document

## 2010-10-28 NOTE — Assessment & Plan Note (Signed)
Summary: 3 MTH FU---STC   Vital Signs:  Patient profile:   45 year old female Height:      62 inches Weight:      170 pounds BMI:     31.21 Temp:     98.3 degrees F oral Pulse rate:   76 / minute Pulse rhythm:   regular Resp:     16 per minute BP sitting:   130 / 90  (left arm) Cuff size:   regular  Vitals Entered By: Lanier Prude, CMA(AAMA) (September 09, 2010 9:22 AM) CC: 3 mo f/u  Is Patient Diabetic? No   Primary Care Provider:  Tresa Garter MD  CC:  3 mo f/u .  History of Present Illness: C/o more frequent GERD and abd spells q 2 wks lasitng 2 wks and may be accompaning by CP  Current Medications (verified): 1)  Atenolol 50 Mg Tabs (Atenolol) .Marland Kitchen.. 1 By Mouth Qd 2)  Vitamin D3 1000 Unit  Tabs (Cholecalciferol) .Marland Kitchen.. 1 By Mouth Daily 3)  Ranitidine Hcl 150 Mg Tabs (Ranitidine Hcl) .Marland Kitchen.. 1 By Mouth Two Times A Day For Indigestion  Allergies (verified): 1)  Lipitor  Past History:  Past Medical History: Hypertension gyn  Dr Allena Katz in [facility] Heart cath 2009 - CAD Dr Linton Ham - [facility] Coronary artery disease Hyperlipidemia Statin intol Acute pancreatitis 2011 Pancreatitis 05/2010 GERD  Past Surgical History: Tubal ligation Cholecystectomy 2008  Review of Systems       The patient complains of chest pain, abdominal pain, and severe indigestion/heartburn.  The patient denies fever.    Physical Exam  General:  alert, well-developed, well-nourished, well-hydrated, and mild distress with cough.   Nose:  No nasal discharge, mucosal erythema, and mucosal edema.  no airflow obstruction, no intranasal foreign body, no nasal polyps, no nasal mucosal lesions, no mucosal friability, no active bleeding or clots, nasal dischargemucosal pallor, mucosal erythema, and mucosal edema.   Mouth:  good dentition, no gingival abnormalities, no exudates, no posterior lymphoid hypertrophy, no postnasal drip, no pharyngeal crowing, no lesions, no erosions, no leukoplakia, no  petechiae, and pharyngeal erythema.   Lungs:  normal respiratory effort, no intercostal retractions, no accessory muscle use, normal breath sounds, no dullness, no fremitus, no crackles, and no wheezes.   Heart:  normal rate, regular rhythm, no murmur, no gallop, no rub, and no JVD.   Abdomen:  epig area is sensitive Extremities:  No clubbing, cyanosis, edema, or deformity noted with normal full range of motion of all joints.   Neurologic:  No cranial nerve deficits noted. Station and gait are normal. Plantar reflexes are down-going bilaterally. DTRs are symmetrical throughout. Sensory, motor and coordinative functions appear intact. Skin:  Intact without suspicious lesions or rashes Psych:  Cognition and judgment appear intact. Alert and cooperative with normal attention span and concentration. No apparent delusions, illusions, hallucinations   Impression & Recommendations:  Problem # 1:  GERD (ICD-530.81) Assessment Deteriorated  Her updated medication list for this problem includes:    Ranitidine Hcl 150 Mg Tabs (Ranitidine hcl) .Marland Kitchen... 1 by mouth two times a day for indigestion    Omeprazole 40 Mg Cpdr (Omeprazole) .Marland Kitchen... 1 by mouth qam for indigestion  Orders: TLB-BMP (Basic Metabolic Panel-BMET) (80048-METABOL) TLB-CBC Platelet - w/Differential (85025-CBCD) TLB-Hepatic/Liver Function Pnl (80076-HEPATIC) TLB-Lipase (83690-LIPASE) TLB-TSH (Thyroid Stimulating Hormone) (84443-TSH) TLB-Udip ONLY (81003-UDIP)  Problem # 2:  ABDOMINAL PAIN (ICD-789.00) poss billiary dysfunction Assessment: Deteriorated GI cons if not better Orders: TLB-BMP (Basic Metabolic Panel-BMET) (80048-METABOL) TLB-CBC Platelet -  w/Differential (85025-CBCD) TLB-Hepatic/Liver Function Pnl (80076-HEPATIC) TLB-Lipase (83690-LIPASE) TLB-TSH (Thyroid Stimulating Hormone) (84443-TSH) TLB-Udip ONLY (81003-UDIP)  Problem # 3:  HYPERTHYROIDISM (ICD-242.90) Assessment: Unchanged  Her updated medication list for this  problem includes:    Atenolol 50 Mg Tabs (Atenolol) .Marland Kitchen... 1 by mouth qd  Problem # 4:  CORONARY ARTERY DISEASE (ICD-414.00) Assessment: Unchanged  Her updated medication list for this problem includes:    Atenolol 50 Mg Tabs (Atenolol) .Marland Kitchen... 1 by mouth qd    Aspir-low 81 Mg Tbec (Aspirin) .Marland Kitchen... 1 by mouth once daily pc  Orders: TLB-BMP (Basic Metabolic Panel-BMET) (80048-METABOL) TLB-CBC Platelet - w/Differential (85025-CBCD) TLB-Hepatic/Liver Function Pnl (80076-HEPATIC) TLB-Lipase (83690-LIPASE) TLB-TSH (Thyroid Stimulating Hormone) (84443-TSH) TLB-Udip ONLY (81003-UDIP)  Complete Medication List: 1)  Atenolol 50 Mg Tabs (Atenolol) .Marland Kitchen.. 1 by mouth qd 2)  Vitamin D3 1000 Unit Tabs (Cholecalciferol) .Marland Kitchen.. 1 by mouth daily 3)  Ranitidine Hcl 150 Mg Tabs (Ranitidine hcl) .Marland Kitchen.. 1 by mouth two times a day for indigestion 4)  Omeprazole 40 Mg Cpdr (Omeprazole) .Marland Kitchen.. 1 by mouth qam for indigestion 5)  Aspir-low 81 Mg Tbec (Aspirin) .Marland Kitchen.. 1 by mouth once daily pc 6)  Creon 12000 Unit Cpep (Pancrelipase (lip-prot-amyl)) .Marland Kitchen.. 1-2 by mouth three times a day w/meals  Patient Instructions: 1)  Please schedule a follow-up appointment in 3 months. Prescriptions: OMEPRAZOLE 40 MG CPDR (OMEPRAZOLE) 1 by mouth qam for indigestion  #90 x 3   Entered and Authorized by:   Tresa Garter MD   Signed by:   Tresa Garter MD on 09/09/2010   Method used:   Print then Give to Patient   RxID:   [id number] CREON 12000 UNIT CPEP (PANCRELIPASE (LIP-PROT-AMYL)) 1-2 by mouth three times a day w/meals  #120 x 11   Entered and Authorized by:   Tresa Garter MD   Signed by:   Tresa Garter MD on 09/09/2010   Method used:   Print then Give to Patient   RxID:   [id number] OMEPRAZOLE 40 MG CPDR (OMEPRAZOLE) 1 by mouth qam for indigestion  #90 x 3   Entered and Authorized by:   Tresa Garter MD   Signed by:   Tresa Garter MD on 09/09/2010   Method used:   Electronically  to        [facility]. RX* (retail)       [address]       [place], [place]  [address]       Ph: [phone]       Fax: [phone]   RxID:   [id number]    Orders Added: 1)  TLB-BMP (Basic Metabolic Panel-BMET) [80048-METABOL] 2)  TLB-CBC Platelet - w/Differential [85025-CBCD] 3)  TLB-Hepatic/Liver Function Pnl [80076-HEPATIC] 4)  TLB-Lipase [83690-LIPASE] 5)  TLB-TSH (Thyroid Stimulating Hormone) [84443-TSH] 6)  TLB-Udip ONLY [81003-UDIP] 7)  Est. Patient Level IV [56433]

## 2010-10-28 NOTE — Progress Notes (Signed)
Summary: BP reading  Phone Note Call from Patient Call back at [facility] Phone [phone]   Caller: Patient  [phone] cell  Call For: Tresa Garter MD Summary of Call: Pt requesting a call with her blood presssure reading from 12/15. Initial call taken by: Verdell Face,  September 10, 2010 10:08 AM  Follow-up for Phone Call        pt just needed her reading from 09-09-10 OV. I informed her it was  130/90. Follow-up by: Lanier Prude, [facility]),  September 10, 2010 4:57 PM

## 2010-12-15 ENCOUNTER — Ambulatory Visit: Payer: Self-pay | Admitting: Internal Medicine

## 2010-12-15 DIAGNOSIS — Z0289 Encounter for other administrative examinations: Secondary | ICD-10-CM

## 2011-02-08 NOTE — Discharge Summary (Signed)
[name], [name]              ACCOUNT NO.:  [id number]   MEDICAL RECORD NO.:  [id number]          PATIENT TYPE:  INP   LOCATION:  4711                         FACILITY:  [facility]   PHYSICIAN:  Georgina Quint. Plotnikov, MDDATE OF BIRTH:  02/16/1966   DATE OF ADMISSION:  04/17/2008  DATE OF DISCHARGE:  04/20/2008                               DISCHARGE SUMMARY   FINAL DIAGNOSES:  1. Atypical chest pain, myocardial infarction/pulmonary embolism ruled      out.  Most likely related to stomach spasms.  Improved.  2. Elevated glucose.  3. Gastroesophageal reflux disease.  4. Hypertension.   TESTS:  CT of the chest.   SPECIAL INSTRUCTIONS:  Call if problems, follow with Dr. Posey Rea in 1-  2 weeks.  GI consultation as an outpatient if problem continues.   DIET:  No added salt.  Cut back on carbohydrates.  No greasy or spicy  meals.   DISCHARGE MEDICATIONS:  1. Protonix 40 mg daily.  2. Carafate 1 gram p.o. before meals and at bedtime 4 times a day.  3. Librax 1 p.o. t.i.d. p.r.n. stomach spasms.  4. Tylenol 650 mg p.o. t.i.d. p.r.n. pain.  5. Resume other home medicines.   HISTORY:  The patient is a 45 year old female who was admitted through  emergency room with complaint of abdominal and chest pain related to  meals.  Pain was severe.  Further details, please address to history and  physical on April 18, 2008 by Dr. Felicity Coyer.   HOSPITAL COURSE:  During the course of hospitalization, the patient was  kept on telemetry.  Myocardial infarction was ruled out by enzymes.  She  continued to have recurrent episodes of chest pain, back pain, and  abdominal pain.  CT scan of the chest was obtained and was negative for  PE or other pathology.  EKGs were unchanged.  On the day of discharge,  she was feeling much better.  Stomach pain and chest pain continues to  come back with meals, but overall, much better.   PHYSICAL EXAMINATION:  VITAL SIGNS:  On the day of discharge, her blood  pressure was 110/67, heart rate 55, respirations 20, temperature 97.6,  sats 98% on room air, and blood sugar 91.  GENERAL:  She was in no acute distress, looks comfortable.  HEENT:  Moist mucosa.  NECK:  Supple.  LUNGS:  Clear.  Chest wall nontender.  HEART:  S1, S2.  No gallop, no rub.  ABDOMEN:  Soft, nontender.  No organomegaly, no mass felt.  BACK:  LS spine normal.  Thoracic spine normal.  SKIN:  Clear.  NEURO:  She was alert, oriented, and cooperative.   LABORATORY DATA:  EKG with normal sinus rhythm, nonspecific T-wave  abnormality.  CT negative for PE, some basilar atelectasis, no aneurysm.  White count was 11.5, potassium 4.3, glucose 153 on April 17, 2008,  cholesterol 207, D-dimer was less than 0.2.  Urine pregnancy test was  negative.  Cardiac enzymes, troponin, and myoglobin negative.      Georgina Quint. Plotnikov, MD  Electronically Signed     AVP/MEDQ  D:  04/20/2008  T:  04/20/2008  Job:  [id number]

## 2011-02-08 NOTE — Assessment & Plan Note (Signed)
[facility]                                 ON-CALL NOTE   ANNITTA, FIFIELD                       MRN:          [id number]  DATE:04/08/2007                            DOB:          [birth year]    Patient of Dr. Posey Rea.   The patient calls in stating that she was seen by Dr. Posey Rea last  week with a jaw infection. She was told that it was due to a tooth  abscess.  She has been put on Penicillin and is supposed to have her  tooth pulled by her dentist in a couple of weeks.  She has called in  today, she states the pain is increased and now she is having chest  discomfort.  This information is being relayed by her mother, while the  patient is in the shower.  I advised her mother to take the patient to  the emergency department if she is complaining of chest pain. I cannot  attribute the jaw pain directly to the chest pain, unless it is  radiation from the chest.  Mother expressed understanding and will relay  the information to her daughter.     Leanne Chang, M.D.  Electronically Signed    LA/MedQ  DD: 04/08/2007  DT: 04/09/2007  Job #: [id number]

## 2011-02-10 ENCOUNTER — Ambulatory Visit (INDEPENDENT_AMBULATORY_CARE_PROVIDER_SITE_OTHER): Payer: Commercial Managed Care - PPO | Admitting: Endocrinology

## 2011-02-10 ENCOUNTER — Encounter: Payer: Self-pay | Admitting: Endocrinology

## 2011-02-10 ENCOUNTER — Other Ambulatory Visit (INDEPENDENT_AMBULATORY_CARE_PROVIDER_SITE_OTHER): Payer: Commercial Managed Care - PPO

## 2011-02-10 VITALS — BP 128/88 | HR 46 | Temp 99.0°F | Ht 62.0 in | Wt 168.2 lb

## 2011-02-10 DIAGNOSIS — R109 Unspecified abdominal pain: Secondary | ICD-10-CM

## 2011-02-10 DIAGNOSIS — K859 Acute pancreatitis without necrosis or infection, unspecified: Secondary | ICD-10-CM

## 2011-02-10 LAB — POCT URINALYSIS DIPSTICK
Ketones, UA: NEGATIVE
Protein, UA: NEGATIVE
Spec Grav, UA: 1.005
pH, UA: 6

## 2011-02-10 LAB — CBC WITH DIFFERENTIAL/PLATELET
Basophils Absolute: 0.1 10*3/uL (ref 0.0–0.1)
Eosinophils Absolute: 0.1 10*3/uL (ref 0.0–0.7)
HCT: 41.1 % (ref 36.0–46.0)
Lymphs Abs: 2.7 10*3/uL (ref 0.7–4.0)
MCHC: 34.4 g/dL (ref 30.0–36.0)
MCV: 94.7 fl (ref 78.0–100.0)
Monocytes Absolute: 0.3 10*3/uL (ref 0.1–1.0)
Neutrophils Relative %: 56 % (ref 43.0–77.0)
Platelets: 267 10*3/uL (ref 150.0–400.0)
RDW: 13.6 % (ref 11.5–14.6)
WBC: 7.3 10*3/uL (ref 4.5–10.5)

## 2011-02-10 LAB — AMYLASE: Amylase: 55 U/L (ref 27–131)

## 2011-02-10 LAB — HEPATIC FUNCTION PANEL
ALT: 13 U/L (ref 0–35)
Bilirubin, Direct: 0.1 mg/dL (ref 0.0–0.3)
Total Bilirubin: 1.1 mg/dL (ref 0.3–1.2)

## 2011-02-10 NOTE — Patient Instructions (Addendum)
I hope you feel better soon.  If you don't feel better by next week (facial swelling, dizziness, or right sided pain), please call your doctor. blood tests are being ordered for you today.  please call [phone] to hear your test results.  You will be prompted to enter the 9-digit "MRN" number that appears at the top left of this page, followed by #.  Then you will hear the message. (update: i left message on phone-tree:  rx as we discussed)

## 2011-02-10 NOTE — Progress Notes (Signed)
  Subjective:    Patient ID: [name], female    DOB: 11/16/1965, 45 y.o.   MRN: [id number]  HPI 4 days of intermittent moderate pain at the right flank, worse in the context of urination, rad to the right inguinal area.  She does not recall any injury.  She has reg menses.  She has had cholecystectomy.  She has few days of swelling at the right mandible, and assoc pain and itching.  She was stung by an insect.  sxs are improved but not resolved.   Past Medical History  Diagnosis Date  . Hypertension   . CAD (coronary artery disease) 2009    Dr Linton Ham -[facility], Heart Cath  . Hyperlipidemia   . Statin intolerance   . Acute pancreatitis 2011  . GERD (gastroesophageal reflux disease)     Past Surgical History  Procedure Date  . Tubal ligation   . Cholecystectomy 2008    History   Social History  . Marital Status: Married    Spouse Name: N/A    Number of Children: N/A  . Years of Education: N/A   Occupational History  . Not on file.   Social History Main Topics  . Smoking status: Never Smoker   . Smokeless tobacco: Not on file  . Alcohol Use: Not on file  . Drug Use: No  . Sexually Active: Not on file   Other Topics Concern  . Not on file   Social History Narrative   FAMILY HISTORYHistory of hypertensionHistory of prostate cancer 1st degree relative <50D, S ovar. CARegular exercise - YESGYN Dr Allena Katz - [place]    Current Outpatient Prescriptions on File Prior to Visit  Medication Sig Dispense Refill  . aspirin 81 MG EC tablet Take 81 mg by mouth daily.        Marland Kitchen atenolol (TENORMIN) 50 MG tablet Take 50 mg by mouth daily.        . Cholecalciferol (VITAMIN D3) 1000 UNIT tablet Take 1,000 Units by mouth daily.        . ranitidine (ZANTAC) 150 MG capsule Take 150 mg by mouth 2 (two) times daily as needed. For indigestion       . Pancrelipase, Lip-Prot-Amyl, (CREON) 12000 UNIT CPEP Take 1-2 capsules by mouth 3 (three) times daily with meals.        Marland Kitchen DISCONTD:  omeprazole (PRILOSEC) 40 MG capsule Take 40 mg by mouth daily. For indigestion         Allergies  Allergen Reactions  . Atorvastatin     REACTION: achy    No family history on file.  BP 128/88  Pulse 46  Temp(Src) 99 F (37.2 C) (Oral)  Ht 5\' 2"  (1.575 m)  Wt 168 lb 3.2 oz (76.295 kg)  BMI 30.76 kg/m2  SpO2 97%    Review of Systems Denies fever and numbness.  She denies dizziness.    Objective:   Physical Exam GENERAL: no distress Right mandibular area: there is slight swelling, but no erythema/tenderness/warmth.  There a a centrally-located bite or sting wound, which is healing.   ABDOMEN: abdomen is soft, nontender.  no hepatosplenomegaly.   not distended.  no hernia Right flank: nontender. Neuro: sensation is intact to touch on the legs.       Assessment & Plan:  abd pain, uncertain etiology Insect bite or sting, new

## 2011-02-11 NOTE — Op Note (Signed)
[name], [name]              ACCOUNT NO.:  [id number]   MEDICAL RECORD NO.:  [id number]          PATIENT TYPE:  AMB   LOCATION:  DAY                          FACILITY:  [facility]   PHYSICIAN:  Alfonse Ras, MD   DATE OF BIRTH:  [birth year]   DATE OF PROCEDURE:  08/22/2006  DATE OF DISCHARGE:                               OPERATIVE REPORT   PREOPERATIVE DIAGNOSIS:  Symptomatic cholelithiasis.   POSTOPERATIVE DIAGNOSIS:  Symptomatic cholelithiasis.   PROCEDURE:  Laparoscopic cholecystectomy.   SURGEON:  Baruch Merl, M.D.   ASSISTANT:  Rose Phi. Maple Hudson, M.D.   ANESTHESIA:  General.   DESCRIPTION:  The patient was taken to the operating room, placed in  supine position.  After adequate general anesthesia was induced using  endotracheal tube, the abdomen was prepped draped normal sterile  fashion.  Using a transverse infraumbilical incision I dissect down the  fascia.  Fascia was opened vertically.  Peritoneum was entered.  A 0  Vicryl pursestring suture was placed on the fascial defect.  Hasson  trocar was placed in the abdomen.  Pneumoperitoneum was obtained.  Under  direct vision 11 mm trocar was placed in subxiphoid region, two 5 mm  trocars were placed in the right abdomen.  Gallbladder was identified  and retracted cephalad.  The cystic duct was dissected free, critical  view was obtained.  It was triply clipped and divided.  Cystic artery  was identified in a similar fashion, triply clipped and divided.  Gallbladder was taken off the gallbladder bed using Bovie  electrocautery.  There was minimal mesentery and some bleeding from the  gallbladder bed.  It was placed in EndoCatch bag.  FloSeal and Surgicel  was placed in the gallbladder bed.  The gallbladder was removed through  the umbilical port.  Right upper quadrant was copiously irrigated.  Adequate hemostasis was assured.  Pneumoperitoneum was released.  The 0  Vicryl pursestring suture was closed at the umbilicus.   Skin incisions  were closed with subcuticular 4-0 Monocryl.  Steri-Strips and dressings  were applied.  The patient tolerated the procedure well, went to PACU in  good condition.      Alfonse Ras, MD  Electronically Signed     KRE/MEDQ  D:  08/22/2006  T:  08/22/2006  Job:  [id number]

## 2011-02-11 NOTE — H&P (Signed)
[name], [name]                          ACCOUNT NO.:  [id number]   MEDICAL RECORD NO.:  [id number]                   PATIENT TYPE:  INP   LOCATION:  0344                                 [facility]ACILITY:  [facility]   PHYSICIAN:  Francisca December, M.D.               DATE OF BIRTH:  04/21/1966   DATE OF ADMISSION:  02/04/2003  DATE OF DISCHARGE:                                HISTORY & PHYSICAL   PRIMARY CARE CARDIOLOGIST:  Armanda Magic, M.D.   PRIMARY CARE PHYSICIAN:  Meredith Staggers, M.D.   IMPRESSION:  (As dictated by Dr. Corliss Marcus)  1. Chest pain in a 45 year old female who is status post prior cardiac     workup last year, including stress Cardiolite which was negative for     ischemia;  ejection fraction 61%.  She has baseline Q-wave abnormalities     on her EKG.  Her first set of cardiac enzymes are negative.  Chest x-ray     was negative for congestive heart failure.  Suspect gastroesophageal     reflux disease in etiology;  she is status post a prednisone Dose Pack to     have proton pump inhibitor coverage.  She continues with chest discomfort     despite having had GI cocktail.  Some improvement in chest pain after     sublingual nitroglycerin's.  Cardiac risk factors are only in that her     father had a myocardial infarction at age of 5.  2. History of palpitations;  prior event monitor revealed frequent premature     ventricular complexes/bigeminal premature ventricular complexes;  single     episode of possible supraventricular tachycardia.  She was placed on     Toprol XL 25 mg by primary care physician about five days earlier     secondary to increased heart rate.  Prior to this, she had much decreased     palpitations because she had decreased her caffeine intake.  Her event     monitor had been from last year.  3. Recent treatment for reaction to fire ant bites which occurred two weeks     earlier.  She was started on prednisone and a proton pump inhibitor by    primary care physician about five days earlier.  She completed the Dose     Pack a few days ago.  She is due to complete the proton pump inhibitor     later this week.  4. History of gastroesophageal reflux disease.  Use of proton pump inhibitor     in the past.  5. History of premature ventricular complexes.   PLAN:  (As dictated by Dr. Corliss Marcus)  1. Rule out myocardial infarction protocol with serial cardiac enzymes daily     and EKG.  2. If rules out, anticipate discharge to home in the morning.  Would     continue proton pump inhibitor  on a regular basis.  3. We will treat chest discomfort with proton pump inhibitor as well as     p.r.n. IV morphine and IV nitroglycerin drip.   HISTORY OF PRESENT ILLNESS:  The patient is a pleasant 45 year old female  with a history of chest pain;  prior workup in 5/03, included a stress  Cardiolite which was negative for ischemia.  Ejection fraction was 61%.  She  has a history of palpitations with prior event monitor revealing premature  ventricular complexes, occasional bigeminy.  Episode of sinus tachycardia  versus atrial flutter with 2:1 block (not a clear strip).   Two days ago, the patient was bit by fire ants and subsequently felt her  heart racing and tightness in her jaw and throat.  She was seen by Dr.  Dayton Scrape who prescribed the prednisone Dose Pack and some sort of proton pump  inhibitor.  He also started Toprol about five days ago because of increased  heart rate (25 mg per day).   The patient awoke this morning with episodic left sharp neck pain which  was transient, shooting in nature.  She subsequently developed anterior  chest pressure with associated shortness of breath/malaise, which was  persistent and constant.  She later had left arm, left leg fatigue, felt  could not hold items for a long period of time in her left arm.  The first  set of cardiac enzymes are negative.  EKG reveals T-wave abnormality  inferior and  precordial leads which are essentially unchanged from baseline.  Chest x-ray is negative for congestive heart failure.   PAST MEDICAL HISTORY:  As above.   PAST SURGICAL HISTORY:  Tubal ligation in 1995.   ALLERGIES:  No known drug allergies.   MEDICATIONS:  1. Proton pump inhibitor.  2. Status post prednisone Dose Pack which she completed two days earlier.   SOCIAL HISTORY:  She is single.  She has four children alive and well.  Tobacco/ETOH:  Negative.  She has decreased and almost eliminated caffeine  intake.   FAMILY HISTORY:  Father age 31, history of prostate cancer, hypertension,  stroke, and a myocardial infarction at age 48.  Mother is age 62, with a  history of diabetes.  Brother is age 9, with hypertension.  Sisters are in  their 30's to 74's, one with cardiac murmur, other alive and well.   REVIEW OF SYMPTOMS:  As in HPI/previous medical history, otherwise  essentially benign.  She has noted some lower extremity and hand swelling  questionably related to prednisone.  Some low back pain.   PHYSICAL EXAMINATION:  (As performed by Dr. Corliss Marcus)  VITAL SIGNS:  Blood pressure 151/93, heart rate 72 and regular, respiratory  rate 18, temperature 97.5, her saturation is 100% on room air.  GENERAL:  She is a well-nourished, pleasantly conversant, 45 year old female  in no acute distress.  HEENT:  Brisk bilateral carotid upstrokes without bruits.  NECK:  No JVD, no thyromegaly.  CHEST:  Lung sounds are clear with equal bilateral excursion.  CARDIAC:  Regular rate and rhythm without murmurs, rubs, or gallops.  Normal  S1 and S2.  ABDOMEN:  Soft, nondistended, normoactive bowel sounds.  Negative abdominal  aorta, carotid, and femoral bruits.  EXTREMITIES:  Distal pulses intact.  Negative pedal edema.  NEUROLOGIC:  Grossly nonfocal, alert and oriented x3.  GENITOURINARY:  Deferred.  RECTAL:  Deferred.   LABORATORY DATA:  Hemoglobin 13.9, hematocrit 34.4, white blood  cell count  9.6, platelets 318,  differential within normal range.  Sodium 140, potassium  4, chloride 103, CO2 29, BUN 11, creatinine 0.7, glucose 100.  CK 55, MB  fraction 0.4, troponin-I 0.01.  Chest x-ray is clear.  EKG reveals inferior  precordial T-wave abnormalities, not much change from baseline.       Salomon Fick, N.P.                       Francisca December, M.D.    MES/MEDQ  D:  02/04/2003  T:  02/04/2003  Job:  [id number]   cc:   Meredith Staggers, M.D.  [address]  [place]  [place] [address]  Fax: [phone]

## 2011-02-11 NOTE — Discharge Summary (Signed)
[name], Elizabeth              ACCOUNT NO.:  [id number]   MEDICAL RECORD NO.:  [id number]          PATIENT TYPE:  INP   LOCATION:  0356                         FACILITY:  [facility]   PHYSICIAN:  Rene Paci, M.D. LHCDATE OF BIRTH:  01/30/1966   DATE OF ADMISSION:  09/10/2004  DATE OF DISCHARGE:  09/14/2004                                 DISCHARGE SUMMARY   DISCHARGE DIAGNOSES:  1.  Acute pyelonephritis, positive E. coli culture, sensitive to Cipro,      continue oral antibiotics.  2.  Nausea, vomiting, and flank pain secondary to above, resolved.  3.  Diarrhea with crampy abdominal pain consistent with viral      syndrome/gastroenteritis.   DISCHARGE MEDICATIONS:  1.  Cipro 500 mg p.o. b.i.d. x 8 additional days, complete 2 week course.  2.  Tylenol or Advil over-the-counter p.r.n. pain.   Hospital follow up will be arranged with primary care physician, Dr. Sonda Primes, soon after completing antibiotics.  The patient is instructed to  return to the emergency room or call primary care physician for fever,  nausea, vomiting, or intolerance of antibiotics.   CONDITION ON DISCHARGE:  Medically stable and improved, tolerating p.o.  antibiotics as well as regular diet.   BRIEF HOSPITAL COURSE BY PROBLEM:  ACUTE PYELONEPHRITIS:  The patient is a  45 year old woman, presented to the emergency room with 2 day history of  left lower quadrant and flank pain, negative CT in the emergency room but UA  with too numerous to count white cells and bacteria.  Clinically significant  for pyelonephritis, admitted for IV fluids and antibiotics.  Urine culture  returned E. coli.  The patient responded quickly to these treatments.  She  was changed to p.o. antibiotics on the 19th and ready for discharge on the  20th, and she has tolerated these without problem.  Her pain and fever have  resolved, and she is having no further issues at this time.     [place]   VL/MEDQ  D:  09/14/2004  T:   09/14/2004  Job:  [id number]

## 2011-02-11 NOTE — H&P (Signed)
NAMEUBAH, RADKE              ACCOUNT NO.:  [id number]   MEDICAL RECORD NO.:  [id number]          PATIENT TYPE:  EMS   LOCATION:  ED                           [facility]ACILITY[facility]   PHYSICIAN:  Valetta Mole. Swords, M.D. Romualdo Bolk OF BIRTH:  [birth year]   DATE OF ADMISSION:  09/10/2004  DATE OF DISCHARGE:                                HISTORY & PHYSICAL   CHIEF COMPLAINT/HISTORY PRESENT ILLNESS:  [name], a 45 year old  generally healthy female, has a 2 day history of left lower quadrant pain,  now associated with fever, chills, sweats, nausea, and vomiting.  She has  some associated left-sided flank discomfort as well.   PAST MEDICAL HISTORY:  Vague knee trouble for which she required a series of  injections.  She has had no surgeries.  She takes no medications, no known  drug allergies.   SOCIAL HISTORY:  She is an Environmental health practitioner.  She is not married,  has three healthy children.  She has not been sexually active in 2 years.   REVIEW OF SYSTEMS:  She denies any chest pain, cough, shortness of breath,  PND, orthopnea.  She denies any dyspnea on exertion.  She does have some  nausea and emesis.  She has left lower quadrant discomfort.  She denies any  rashes, lower extremity edema, neurologic complaints, or any other  complaints in the review of systems other than those listed above.   PHYSICAL EXAMINATION:  VITAL SIGNS:  Temperature 100.2, pulse 100,  respirations 14, blood pressure 110/70.  GENERAL:  She appears well-developed, well-nourished female in no acute  distress.  She does appear mildly ill.  HEENT:  Atraumatic, normocephalic, extraocular muscles are intact.  NECK:  Supple without lymphadenopathy, thyromegaly, jugular venous  distension, or carotid bruits.  CHEST:  Clear to auscultation without any increased work of breathing.  CARDIAC:  S1 and S2 are normal without murmurs or gallops.  ABDOMEN:  Active bowel sounds, soft, nontender.  There is no  hepatosplenomegaly.  No masses are palpated.  EXTREMITIES:  There is no cyanosis, clubbing, or edema.   LABORATORY DATA:  UA significant for too numerous to count white blood  cells.  CBC significant for white count of 14.6.  BMET normal except for a  sodium of 133, potassium 3.3, and a glucose 118.   ASSESSMENT AND PLAN:  Presumed pyelonephritis with abnormal UA, nausea,  vomiting, and fever.  A CT scan was obtained.  There is no specific  abnormality in the abdomen or pelvis.   We will treat aggressively with IV Cipro, IV fluids for presumed  dehydration, and antiemetics.  I suspect she will respond quickly.      BHS/MEDQ  D:  09/10/2004  T:  09/10/2004  Job:  [id number]   cc:   Georgina Quint. Plotnikov, M.D. [facility]

## 2011-02-14 ENCOUNTER — Telehealth: Payer: Self-pay | Admitting: *Deleted

## 2011-02-14 NOTE — Telephone Encounter (Signed)
Pt was seen recently by Dr Everardo All. She was advised to continue to monitor her low HR. Heart rate today was 41. 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

## 2011-02-14 NOTE — Telephone Encounter (Signed)
Hold Atenolol x 1 d, then restart at 1/2 dose. OV Thx

## 2011-02-15 NOTE — Telephone Encounter (Signed)
Patient informed. 

## 2011-02-19 ENCOUNTER — Emergency Department (HOSPITAL_COMMUNITY): Payer: Commercial Managed Care - PPO

## 2011-02-19 ENCOUNTER — Observation Stay (HOSPITAL_COMMUNITY)
Admission: EM | Admit: 2011-02-19 | Discharge: 2011-02-20 | DRG: 313 | Disposition: A | Discharge status: Home / Self Care | Payer: Commercial Managed Care - PPO | Attending: Internal Medicine | Admitting: Internal Medicine

## 2011-02-19 DIAGNOSIS — R079 Chest pain, unspecified: Secondary | ICD-10-CM

## 2011-02-19 DIAGNOSIS — R5381 Other malaise: Secondary | ICD-10-CM

## 2011-02-19 DIAGNOSIS — R0789 Other chest pain: Principal | ICD-10-CM | POA: Insufficient documentation

## 2011-02-19 DIAGNOSIS — E0590 Thyrotoxicosis, unspecified without thyrotoxic crisis or storm: Secondary | ICD-10-CM | POA: Insufficient documentation

## 2011-02-19 DIAGNOSIS — K219 Gastro-esophageal reflux disease without esophagitis: Secondary | ICD-10-CM | POA: Insufficient documentation

## 2011-02-19 DIAGNOSIS — I2510 Atherosclerotic heart disease of native coronary artery without angina pectoris: Secondary | ICD-10-CM | POA: Insufficient documentation

## 2011-02-19 DIAGNOSIS — R5383 Other fatigue: Secondary | ICD-10-CM

## 2011-02-19 DIAGNOSIS — I10 Essential (primary) hypertension: Secondary | ICD-10-CM | POA: Insufficient documentation

## 2011-02-19 LAB — BASIC METABOLIC PANEL
BUN: 9 mg/dL (ref 6–23)
CO2: 25 mEq/L (ref 19–32)
GFR calc non Af Amer: >60 mL/min (ref >60)
Glucose, Bld: 94 mg/dL (ref 70–99)
Potassium: 3.4 mEq/L — ABNORMAL LOW (ref 3.5–5.1)
Sodium: 137 mEq/L (ref 135–145)

## 2011-02-19 LAB — CBC
MCHC: 35 g/dL (ref 30.0–36.0)
MCV: 89.4 fL (ref 78.0–100.0)
Platelets: 261 10*3/uL (ref 150–400)
RDW: 12.3 % (ref 11.5–15.5)
WBC: 7.9 10*3/uL (ref 4.0–10.5)

## 2011-02-19 LAB — DIFFERENTIAL
Basophils Absolute: 0 10*3/uL (ref 0.0–0.1)
Eosinophils Absolute: 0.4 10*3/uL (ref 0.0–0.7)
Eosinophils Relative: 5 % (ref 0–5)
Monocytes Absolute: 0.4 10*3/uL (ref 0.1–1.0)

## 2011-02-19 LAB — CK TOTAL AND CKMB (NOT AT ARMC)
CK, MB: 0.8 ng/mL (ref 0.3–4.0)
Total CK: 70 U/L (ref 7–177)

## 2011-02-20 DIAGNOSIS — R079 Chest pain, unspecified: Secondary | ICD-10-CM

## 2011-02-20 LAB — CARDIAC PANEL(CRET KIN+CKTOT+MB+TROPI)
CK, MB: 0.7 ng/mL (ref 0.3–4.0)
Relative Index: INVALID (ref 0.0–2.5)
Total CK: 56 U/L (ref 7–177)
Troponin I: <0.3 ng/mL (ref <0.30)

## 2011-02-20 LAB — LIPID PANEL
HDL: 49 mg/dL (ref >39)
Triglycerides: 168 mg/dL — ABNORMAL HIGH (ref <150)
VLDL: 34 mg/dL (ref 0–40)

## 2011-02-20 LAB — TSH: TSH: 4.116 u[IU]/mL (ref 0.350–4.500)

## 2011-02-20 NOTE — H&P (Signed)
[name], [name]              ACCOUNT NO.:  [id number]  MEDICAL RECORD NO.:  [id number]           PATIENT TYPE:  I  LOCATION:  2024                         FACILITY:  [facility]  PHYSICIAN:  Zacarias Pontes, MD       DATE OF BIRTH:  [birth year]  DATE OF ADMISSION:  02/19/2011 DATE OF DISCHARGE:                             HISTORY & PHYSICAL   Patient location, assessed in the emergency room.  PRIMARY CARDIOLOGIST:  Dr. Sandy Salaam, [facility]  CHIEF COMPLAINT:  Chest pain and fatigue.  HISTORY OF PRESENT ILLNESS:  [name] is a pleasant 45 year old woman with a reported history of medically managed coronary artery disease, hypertension, hyperthyroidism, and GERD who presents with 1 week of fatigue and 1 day of episodic chest pain.  Last week she felt fatigue and saw her cardiologist who reportedly decreased her atenolol dosing from 50 mg daily to 25 mg daily because of a low sinus heart rate in the 40s.  Since then the patient has continued to feel fatigued with chest pain paroxysms occurring earlier in the day yesterday lasting seconds to minutes progressing to chest tightness which was dull and constant in nature lasting several hours.  She reports some relief of sublingual nitroglycerin in the emergency room.  She denies diaphoresis.  She reports a decreased appetite over 1 week accompanied by early satiety. She denies vomiting, fevers, or diarrhea.  PAST MEDICAL HISTORY: 1. Coronary artery disease with no prior history of revascularization.     We have no primary cath report data as her cardiologist and     cardiology testing is based at [facility]. 2. Hyperthyroidism status post a reported ablation with a subsequent     report normal thyroid function. 3. GERD. 4. Hypertension. 5. Reported arrhythmias with report of an ablation having taken place     in the past, though unclear what specific arrhythmia was addressed.  SOCIAL HISTORY:  The patient has 2  children ages 67 and 19.  She is going through a stressful and anxiety-provoking divorce process at the moment.  She is a lifelong nonsmoker.  REVIEW OF SYSTEMS:  As per HPI, otherwise is comprehensively negative.  ALLERGIES:  LIPITOR.  MEDICATIONS: 1. Atenolol 25 mg daily. 2. Aspirin 81 mg daily. 3. Multivitamin once daily.  PHYSICAL EXAMINATION:  GENERAL:  A comprehensive cardiovascular exam was performed. VITAL SIGNS:  Her heart rate is in the 70s and regular.  Blood pressure was 149/78.  She is breathing at 16 times per minute, and satting 98% on room air. GENERAL:  She was in no acute distress. HEENT:  Notable for neck that is supple with no masses or lymphadenopathy. NECK:  JVP is not appreciably elevated. CARDIAC:  Notable for a normal S1 and S2 with no murmurs, rubs, or gallops. LUNGS:  Clear to auscultation bilaterally with no wheezes or crackles appreciated. ABDOMEN:  Soft, nontender, and nondistended with positive bowel sounds and no abdominal bruits appreciated. EXTREMITIES:  Warm and well-perfused with no lower extremity edema.  DP pulses are 2+ bilaterally in her lower extremities. NEUROLOGIC:  Negative for any focal neurologic deficits.  LABORATORY DATA:  Her white count is normal at 7.  Her hematocrit is 40. Her platelets are 261,000.  Sodium is 137, potassium 3.4, chloride 102, bicarb 25, BUN 7, creatinine 0.7.  The glucose 94.  Initial set of cardiac enzymes was normal with a CK of 70, CK-MB of 0.8, and troponin of less than 0.3.  ECG demonstrates sinus rhythm with T-wave inversions in V3 and V4 which are old, normal axis, no ST-segment deviation.  The catheterization was reportedly performed in the past with no reported interventions, though we do not currently have this primary data for review.  IMPRESSION:  This is a 45 year old woman with reported medically managed coronary artery disease presenting with atypical chest pain episode with no  enzymatic or electrocardiographic evidence of ischemia at the moment. Given her history, a complete rule and work up is prudent.  PLANS:  We will admit her to telemetry with serial enzyme measurements. In addition to her cardiac biomarkers evaluation, we will check a TSH given her history of treated hyperthyroidism and her more recent history of fatigue.  We will continue her home atenolol dosing at 25 mg daily which reflects change made by her primary cardiologist approximately 1 week ago.  She will be treated with aspirin and we will withhold statins given her intolerance/allergy in the past.  Obtaining records from her primary cardiologist will be helpful in clarifying what exactly has been done and assessed in the past.  I anticipate she will rule out from an enzyme standpoint subsequent to the stress imaging testing either tomorrow or as an outpatient would provide additional reassurance.  Given the other possibilities for what might be driving her symptoms, a GI cocktail empirically would be helpful in assessing whether gastrointestinal pathology is playing a role.  The plan was explained to the patient, and her questions were answered to the best of my ability.          ______________________________ Zacarias Pontes, MD     DM/MEDQ  D:  02/20/2011  T:  02/20/2011  Job:  [id number]  Electronically Signed by Zacarias Pontes MD on 02/20/2011 04:11:00 PM

## 2011-02-23 ENCOUNTER — Emergency Department (HOSPITAL_COMMUNITY)
Admission: EM | Admit: 2011-02-23 | Discharge: 2011-02-24 | Disposition: A | Discharge status: Home / Self Care | Payer: Commercial Managed Care - PPO | Attending: Emergency Medicine | Admitting: Emergency Medicine

## 2011-02-23 DIAGNOSIS — I2510 Atherosclerotic heart disease of native coronary artery without angina pectoris: Secondary | ICD-10-CM | POA: Insufficient documentation

## 2011-02-23 DIAGNOSIS — M79609 Pain in unspecified limb: Secondary | ICD-10-CM | POA: Insufficient documentation

## 2011-02-23 DIAGNOSIS — Z79899 Other long term (current) drug therapy: Secondary | ICD-10-CM | POA: Insufficient documentation

## 2011-02-23 DIAGNOSIS — I10 Essential (primary) hypertension: Secondary | ICD-10-CM | POA: Insufficient documentation

## 2011-02-23 DIAGNOSIS — R0602 Shortness of breath: Secondary | ICD-10-CM | POA: Insufficient documentation

## 2011-02-23 NOTE — Discharge Summary (Signed)
[name], [name]              ACCOUNT NO.:  [id number]  MEDICAL RECORD NO.:  [id number]           PATIENT TYPE:  I  LOCATION:  2024                         FACILITY:  [facility]  PHYSICIAN:  Noralyn Pick. Eden Emms, MD, FACCDATE OF BIRTH:  1966/06/14  DATE OF ADMISSION:  02/19/2011 DATE OF DISCHARGE:  02/20/2011                              DISCHARGE SUMMARY   PRIMARY CARDIOLOGIST:  Dr. Sandy Salaam at [facility].  PRIMARY CARE PHYSICIAN:  Not listed.  PROCEDURES PERFORMED DURING HOSPITALIZATION:  None.  FINAL DISCHARGE DIAGNOSES: 1. Atypical chest pain. 2. Coronary artery disease.     a.     No prior history of revascularization or cath report per      cardiologist in [facility].  SECONDARY DIAGNOSES: 1. Hyperthyroidism, status post reported ablation with subsequent     report, normal thyroid function. 2. Gastroesophageal reflux disease. 3. Hypertension. 4. Reported arrhythmias with report of ablation that have been taking     in place in the past, so unclear which specific arrhythmia was     addressed.  HOSPITAL COURSE:  This is a 45 year old woman who had above-mentioned medical history who presented with 1 week of fatigue and 1 day of episodic chest pain.  The patient's fatigue became worse.  She saw her cardiologist to reportedly decrease her atenolol dosing from 50-25 mg daily because of low heart rate in the 40s.  Since that time, the patient continued to feel fatigued.  The patient began to have chest discomfort described as tightness, dull, and constant in nature, lasting several hours with some relief of nitroglycerin in the emergency room without associate diaphoresis or shortness of breath.  As a result of this, the patient was seen in the emergency room where she was admitted overnight observation to rule out myocardial infarction.  Labs were cycled, and she was found to be negative for acute MI.  EKG did not show any new ischemic changes.   The patient was seen and examined by Dr. Charlton Haws on day of discharge and found to be stable. She advised to follow up with [facility] for continued cardiac management.  DISCHARGE LABS:  TSH 4.116.  Cardiac enzymes negative x3 at less than 0.30.  Cholesterol 165, triglycerides 168, HDL 49, LDL 82.  Sodium 137, potassium 3.4, chloride 102, CO2 25, glucose 94, BUN 9, creatinine 0.52, amylase 55.  RADIOLOGY:  Chest x-ray Feb 20, 2011, revealing normal chest.  VITAL SIGNS ON DISCHARGE:  Blood pressure 154/94, pulse 55, respirations 18, temperature 98.3, O2 sat 100% on room air.  EKG revealing sinus bradycardia with sinus arrhythmia.  T-wave flattening noted at V5 and aVL with a rate of 56 beats per minute.  DISCHARGE MEDICATIONS: 1. Atenolol 50 mg 1/2 tablet by mouth daily. 2. Multivitamin over-the-counter daily. 3. Aspirin 81 mg daily.  ALLERGIES:  ATORVASTATIN.  FOLLOWUP PLANS AND APPOINTMENT: 1. The patient will follow up with primary care physician and also     with her primary cardiologist for continued medical and cardiac     management with need for repeat cardiac stress Myoview at his     discretion.  2. The patient has been advised to bring all medications to follow up     appointment.  Time spent with the patient to include physician time 30 minutes.     Bettey Mare. Lyman Bishop, NP   ______________________________ Noralyn Pick Eden Emms, MD, [facility]    KML/MEDQ  D:  02/20/2011  T:  02/20/2011  Job:  [id number]  cc:   Sandy Salaam  Electronically Signed by Joni Reining NP on 02/21/2011 09:06:04 PM Electronically Signed by Charlton Haws MD [facility] on 02/23/2011 08:34:38 PM

## 2011-02-24 ENCOUNTER — Ambulatory Visit (HOSPITAL_COMMUNITY)
Admission: RE | Admit: 2011-02-24 | Discharge: 2011-02-24 | Disposition: A | Discharge status: Home / Self Care | Payer: Commercial Managed Care - PPO | Source: Ambulatory Visit | Attending: Emergency Medicine | Admitting: Emergency Medicine

## 2011-02-24 DIAGNOSIS — M79609 Pain in unspecified limb: Secondary | ICD-10-CM

## 2011-02-24 LAB — CBC
HCT: 38.8 % (ref 36.0–46.0)
Hemoglobin: 13.3 g/dL (ref 12.0–15.0)
MCV: 90.4 fL (ref 78.0–100.0)
RBC: 4.29 MIL/uL (ref 3.87–5.11)
RDW: 12.7 % (ref 11.5–15.5)
WBC: 7.2 10*3/uL (ref 4.0–10.5)

## 2011-02-24 LAB — DIFFERENTIAL
Basophils Absolute: 0 10*3/uL (ref 0.0–0.1)
Lymphocytes Relative: 38 % (ref 12–46)
Lymphs Abs: 2.7 10*3/uL (ref 0.7–4.0)
Neutro Abs: 3.8 10*3/uL (ref 1.7–7.7)
Neutrophils Relative %: 53 % (ref 43–77)

## 2011-02-24 LAB — COMPREHENSIVE METABOLIC PANEL
ALT: 15 U/L (ref 0–35)
Alkaline Phosphatase: 50 U/L (ref 39–117)
BUN: 10 mg/dL (ref 6–23)
CO2: 29 mEq/L (ref 19–32)
Chloride: 103 mEq/L (ref 96–112)
Glucose, Bld: 98 mg/dL (ref 70–99)
Potassium: 3.7 mEq/L (ref 3.5–5.1)
Sodium: 138 mEq/L (ref 135–145)
Total Bilirubin: 0.4 mg/dL (ref 0.3–1.2)
Total Protein: 7.3 g/dL (ref 6.0–8.3)

## 2011-04-22 ENCOUNTER — Encounter: Payer: Self-pay | Admitting: Internal Medicine

## 2011-06-24 LAB — POCT CARDIAC MARKERS
Operator id: 294341
Troponin i, poc: <0.05

## 2011-06-24 LAB — URINALYSIS, ROUTINE W REFLEX MICROSCOPIC
Glucose, UA: NEGATIVE
Nitrite: NEGATIVE
Specific Gravity, Urine: 1.007
pH: 7

## 2011-06-24 LAB — DIFFERENTIAL
Eosinophils Absolute: 0
Eosinophils Relative: 0
Lymphs Abs: 0.9
Monocytes Absolute: 0.1
Monocytes Relative: 1 — ABNORMAL LOW

## 2011-06-24 LAB — CARDIAC PANEL(CRET KIN+CKTOT+MB+TROPI)
CK, MB: 0.9
Relative Index: INVALID
Relative Index: INVALID
Total CK: 41
Troponin I: 0.02

## 2011-06-24 LAB — CBC
HCT: 43.2
Hemoglobin: 14.6
MCV: 91.5
Platelets: 349
WBC: 11.5 — ABNORMAL HIGH

## 2011-06-24 LAB — CK TOTAL AND CKMB (NOT AT ARMC)
CK, MB: 0.7
CK, MB: 0.9
Relative Index: INVALID
Relative Index: INVALID
Total CK: 63
Total CK: 65

## 2011-06-24 LAB — LIPID PANEL
Cholesterol: 207 — ABNORMAL HIGH
LDL Cholesterol: 132 — ABNORMAL HIGH
Triglycerides: 80
VLDL: 16

## 2011-06-24 LAB — BASIC METABOLIC PANEL
BUN: 9
CO2: 26
GFR calc Af Amer: >60
Glucose, Bld: 153 — ABNORMAL HIGH

## 2011-06-24 LAB — PREGNANCY, URINE: Preg Test, Ur: NEGATIVE

## 2011-06-24 LAB — TROPONIN I: Troponin I: 0.01

## 2011-07-01 LAB — POCT CARDIAC MARKERS
CKMB, poc: <1 — ABNORMAL LOW
Myoglobin, poc: 63.8
Operator id: 294521

## 2011-07-01 LAB — COMPREHENSIVE METABOLIC PANEL
Alkaline Phosphatase: 54
BUN: 6
Creatinine, Ser: 0.76
Glucose, Bld: 136 — ABNORMAL HIGH
Potassium: 4.3
Total Bilirubin: 0.5
Total Protein: 7.4

## 2011-07-01 LAB — CBC
HCT: 40.8
Hemoglobin: 13.5
MCV: 90.3
Platelets: 318
RDW: 13.2

## 2011-07-01 LAB — DIFFERENTIAL
Basophils Absolute: 0
Basophils Relative: 0
Lymphocytes Relative: 41
Monocytes Relative: 4
Neutro Abs: 3.4
Neutrophils Relative %: 53

## 2011-07-04 ENCOUNTER — Telehealth: Payer: Self-pay | Admitting: *Deleted

## 2011-07-04 NOTE — Telephone Encounter (Signed)
Spoke w/pt - scheduled for apt this week.

## 2011-07-04 NOTE — Telephone Encounter (Signed)
Pt left Vm, She was seen in the [facility] ER for abd pain. They ruled out pancreatitis but she continues to have abd pain and want apt tomorrow. Left mess to call office back.

## 2011-07-07 ENCOUNTER — Telehealth: Payer: Self-pay | Admitting: Internal Medicine

## 2011-07-07 ENCOUNTER — Other Ambulatory Visit (INDEPENDENT_AMBULATORY_CARE_PROVIDER_SITE_OTHER): Payer: Commercial Managed Care - PPO

## 2011-07-07 ENCOUNTER — Ambulatory Visit (INDEPENDENT_AMBULATORY_CARE_PROVIDER_SITE_OTHER): Payer: Commercial Managed Care - PPO | Admitting: Internal Medicine

## 2011-07-07 ENCOUNTER — Encounter: Payer: Self-pay | Admitting: Internal Medicine

## 2011-07-07 DIAGNOSIS — R109 Unspecified abdominal pain: Secondary | ICD-10-CM

## 2011-07-07 LAB — CBC WITH DIFFERENTIAL/PLATELET
Basophils Relative: 0.3 % (ref 0.0–3.0)
Eosinophils Relative: 2.3 % (ref 0.0–5.0)
HCT: 41.1 % (ref 36.0–46.0)
Hemoglobin: 13.5 g/dL (ref 12.0–15.0)
Lymphs Abs: 2.2 10*3/uL (ref 0.7–4.0)
MCV: 94.2 fl (ref 78.0–100.0)
Monocytes Absolute: 0.4 10*3/uL (ref 0.1–1.0)
Neutro Abs: 4.5 10*3/uL (ref 1.4–7.7)
RBC: 4.36 Mil/uL (ref 3.87–5.11)
WBC: 7.2 10*3/uL (ref 4.5–10.5)

## 2011-07-07 LAB — URINALYSIS
Bilirubin Urine: NEGATIVE
Hgb urine dipstick: NEGATIVE
Ketones, ur: NEGATIVE
Total Protein, Urine: NEGATIVE
pH: 6.5 (ref 5.0–8.0)

## 2011-07-07 MED ORDER — PANTOPRAZOLE SODIUM 40 MG PO TBEC: 40.0000 mg | DELAYED_RELEASE_TABLET | Freq: Two times a day (BID) | ORAL | Status: DC

## 2011-07-07 MED ORDER — TRAMADOL HCL 50 MG PO TABS: 50.0000 mg | ORAL_TABLET | Freq: Two times a day (BID) | ORAL | Status: AC | PRN

## 2011-07-07 NOTE — Patient Instructions (Signed)
Stop Aleve !!!!! 

## 2011-07-07 NOTE — Progress Notes (Signed)
  Subjective:    Patient ID: [name], female    DOB: 1965/10/26, 45 y.o.   MRN: [id number]  HPI  C/o upper abd pain irrad in LLQ at times x 1.5 wks - constant 6/10 and 8-9/10 after meals. She went to [facility] on 10/5 and  Had a CT abd and labs. No chills. No n/v/d. No wt loss. Not taking ASA. She has been taking Aleve  2-3/d x wk.  Review of Systems  Constitutional: Negative for fever, chills, activity change, appetite change, fatigue and unexpected weight change.  HENT: Negative for congestion, mouth sores and sinus pressure.   Eyes: Negative for visual disturbance.  Respiratory: Negative for apnea, cough and chest tightness.   Gastrointestinal: Positive for abdominal pain. Negative for nausea, vomiting, diarrhea, constipation, blood in stool, abdominal distention and rectal pain.  Genitourinary: Negative for frequency, hematuria, flank pain, vaginal bleeding, difficulty urinating and vaginal pain.  Musculoskeletal: Negative for back pain and gait problem.  Skin: Negative for pallor and rash.  Neurological: Negative for dizziness, tremors, weakness, numbness and headaches.  Psychiatric/Behavioral: Negative for confusion and sleep disturbance. The patient is not nervous/anxious.        Objective:   Physical Exam  Constitutional: She appears well-developed and well-nourished. No distress.       NAD Non-toxic  HENT:  Head: Normocephalic.  Right Ear: External ear normal.  Left Ear: External ear normal.  Nose: Nose normal.  Mouth/Throat: Oropharynx is clear and moist.  Eyes: Conjunctivae are normal. Pupils are equal, round, and reactive to light. Right eye exhibits no discharge. Left eye exhibits no discharge.  Neck: Normal range of motion. Neck supple. No JVD present. No tracheal deviation present. No thyromegaly present.  Cardiovascular: Normal rate, regular rhythm and normal heart sounds.   Pulmonary/Chest: No stridor. No respiratory distress. She has no wheezes.    Abdominal: Soft. Bowel sounds are normal. She exhibits no distension and no mass. There is tenderness. There is no rebound and no guarding.       epig area and LLQ area are tender  Genitourinary:       Per ER  Musculoskeletal: She exhibits no edema and no tenderness.  Lymphadenopathy:    She has no cervical adenopathy.  Neurological: She displays normal reflexes. No cranial nerve deficit. She exhibits normal muscle tone. Coordination normal.  Skin: No rash noted. No erythema.  Psychiatric: She has a normal mood and affect. Her behavior is normal. Judgment and thought content normal.          Assessment & Plan:

## 2011-07-07 NOTE — Assessment & Plan Note (Signed)
10/12 - severe. CT abd and labs all wnl on 10/5 ([facility] visit) Recods, CT, labs - reviewed. GI consult R/o PUD

## 2011-07-07 NOTE — Telephone Encounter (Signed)
[name], please, inform patient that all labs are normal except for Lipase - a test for pancreatitis Thx

## 2011-07-08 ENCOUNTER — Encounter: Payer: Self-pay | Admitting: Gastroenterology

## 2011-07-08 NOTE — Telephone Encounter (Signed)
Pt informed. She would like to know if she should continue Protonix. Please advise.

## 2011-07-08 NOTE — Telephone Encounter (Signed)
Pt informed

## 2011-07-08 NOTE — Telephone Encounter (Signed)
Pls cont with Protonix Keep ROV Thx

## 2011-07-12 LAB — CBC
MCHC: 33.2
MCV: 91.7
Platelets: 351
RBC: 4.52
RDW: 13

## 2011-07-12 LAB — DIFFERENTIAL
Basophils Relative: 1
Eosinophils Absolute: 0.2
Monocytes Relative: 6
Neutrophils Relative %: 58

## 2011-07-21 ENCOUNTER — Telehealth: Payer: Self-pay | Admitting: *Deleted

## 2011-07-21 NOTE — Telephone Encounter (Signed)
Agree. Thx 

## 2011-07-21 NOTE — Telephone Encounter (Signed)
Spoke with patient - She has apt with GI Monday but c/o severe abd pain, sweats, chest pain, dizziness and nausea. I advised she go to ER due to symptoms and history of abnormal labs. She agreed and will f/u if needed.

## 2011-07-25 ENCOUNTER — Encounter: Payer: Self-pay | Admitting: Gastroenterology

## 2011-07-25 ENCOUNTER — Ambulatory Visit (INDEPENDENT_AMBULATORY_CARE_PROVIDER_SITE_OTHER): Payer: Commercial Managed Care - PPO | Admitting: Gastroenterology

## 2011-07-25 ENCOUNTER — Other Ambulatory Visit (INDEPENDENT_AMBULATORY_CARE_PROVIDER_SITE_OTHER): Payer: Commercial Managed Care - PPO

## 2011-07-25 VITALS — BP 138/90 | HR 68 | Ht 62.0 in | Wt 163.8 lb

## 2011-07-25 DIAGNOSIS — R197 Diarrhea, unspecified: Secondary | ICD-10-CM

## 2011-07-25 DIAGNOSIS — R1013 Epigastric pain: Secondary | ICD-10-CM

## 2011-07-25 LAB — LIPASE: Lipase: 48 U/L (ref 11.0–59.0)

## 2011-07-25 MED ORDER — HYOSCYAMINE SULFATE 0.125 MG SL SUBL: SUBLINGUAL_TABLET | SUBLINGUAL | Status: DC

## 2011-07-25 NOTE — Patient Instructions (Addendum)
Go directly to the basement today to have your labs drawn. You have been scheduled for a Upper Endoscopy. See separate instructions.  Follow instructions on Hemoccult cards and mail back to [place] when finished.  Your prescription for Levsin has been sent to the pharmacy.  Please discontinue Creon.  cc: Sonda Primes, MD

## 2011-07-25 NOTE — Progress Notes (Signed)
History of Present Illness: This is a 45 year old female relates a three-week history of postprandial epigastric pain and frequent postprandial diarrhea. She's also noted some left lower quadrant tenderness. This has been associated with abdominal bloating. For pain tends to last about 2-3 hours following meals and then resolves. Her postprandial diarrhea is intermittent. She was placed on Protonix twice daily and she was also placed on Creon before meals with no change in symptoms. Blood work was normal except for an elevated lipase of 127. CT scan of the abdomen and pelvis performed at [facility] was unremarkable. She states she was diagnosed with pancreatitis approximately one year ago. However she was completely well until 3 weeks ago. Denies weight loss, constipation, diarrhea, change in stool caliber, melena, hematochezia, nausea, vomiting, dysphagia, reflux symptoms, chest pain.  Review of Systems: Pertinent positive and negative review of systems were noted in the above HPI section. All other review of systems were otherwise negative.  Current Medications, Allergies, Past Medical History, Past Surgical History, Family History and Social History were reviewed in [employer] record.  Physical Exam: General: Well developed , well nourished, no acute distress Head: Normocephalic and atraumatic Eyes:  sclerae anicteric, EOMI Ears: Normal auditory acuity Mouth: No deformity or lesions Neck: Supple, no masses or thyromegaly Lungs: Clear throughout to auscultation Heart: Regular rate and rhythm; no murmurs, rubs or bruits Abdomen: Soft, mild epigastric tenderness without rebound or guarding and non distended. No masses, hepatosplenomegaly or hernias noted. Normal Bowel sounds Musculoskeletal: Symmetrical with no gross deformities  Skin: No lesions on visible extremities Pulses:  Normal pulses noted Extremities: No clubbing, cyanosis, edema or deformities  noted Neurological: Alert oriented x 4, grossly nonfocal Cervical Nodes:  No significant cervical adenopathy Inguinal Nodes: No significant inguinal adenopathy Psychological:  Alert and cooperative. Normal mood and affect  Assessment and Recommendations:  1. Abdominal pain associated with bloating and diarrhea. Rule out infectious causes. Rule out ulcer disease. Standard stool studies. Schedule upper endoscopy. The risks, benefits, and alternatives to endoscopy with possible biopsy and possible dilation were discussed with the patient and they consent to proceed.   2. Elevated lipase without any clear evidence of pancreatitis by symptoms and imaging studies. Attempt to get records from her evaluation at [facility] one year ago. Discontinue Creon. Repeat amylase lipase with amylase isoenzymes.

## 2011-07-26 LAB — CELIAC PANEL 10
Endomysial Screen: NEGATIVE
Gliadin IgG: 8.9 U/mL (ref <20)
Tissue Transglut Ab: 9.6 U/mL (ref <20)
Tissue Transglutaminase Ab, IgA: 9.2 U/mL (ref <20)

## 2011-07-26 LAB — AMYLASE ISOENZYMES: Isoamylase-Pancreatic: 72 U/L — ABNORMAL HIGH (ref 16–46)

## 2011-07-28 ENCOUNTER — Other Ambulatory Visit: Payer: Commercial Managed Care - PPO

## 2011-07-28 DIAGNOSIS — R197 Diarrhea, unspecified: Secondary | ICD-10-CM

## 2011-07-28 DIAGNOSIS — R1013 Epigastric pain: Secondary | ICD-10-CM

## 2011-07-29 LAB — FECAL LACTOFERRIN, QUANT: Lactoferrin: NEGATIVE

## 2011-08-05 ENCOUNTER — Other Ambulatory Visit: Payer: Commercial Managed Care - PPO

## 2011-08-10 ENCOUNTER — Other Ambulatory Visit: Payer: Self-pay | Admitting: Gastroenterology

## 2011-08-10 DIAGNOSIS — Z1211 Encounter for screening for malignant neoplasm of colon: Secondary | ICD-10-CM

## 2011-08-10 LAB — HEMOCCULT SLIDES (X 3 CARDS)
OCCULT 1: NEGATIVE
OCCULT 3: NEGATIVE
OCCULT 4: NEGATIVE
OCCULT 5: NEGATIVE

## 2011-08-16 ENCOUNTER — Other Ambulatory Visit: Payer: Self-pay | Admitting: Gastroenterology

## 2011-08-16 ENCOUNTER — Encounter: Payer: Self-pay | Admitting: Gastroenterology

## 2011-08-16 ENCOUNTER — Ambulatory Visit (AMBULATORY_SURGERY_CENTER): Payer: Commercial Managed Care - PPO | Admitting: Gastroenterology

## 2011-08-16 DIAGNOSIS — R197 Diarrhea, unspecified: Secondary | ICD-10-CM

## 2011-08-16 DIAGNOSIS — R1013 Epigastric pain: Secondary | ICD-10-CM

## 2011-08-16 DIAGNOSIS — D131 Benign neoplasm of stomach: Secondary | ICD-10-CM

## 2011-08-16 DIAGNOSIS — R109 Unspecified abdominal pain: Secondary | ICD-10-CM

## 2011-08-16 MED ORDER — SODIUM CHLORIDE 0.9 % IV SOLN: 500.0000 mL | INTRAVENOUS | Status: DC

## 2011-08-16 NOTE — Patient Instructions (Signed)
Please refer to the neon green sheet for instructions regarding activity for the rest of today.  Biopsies taken. Resume previous medications. 

## 2011-08-16 NOTE — Progress Notes (Signed)
Patient did not experience any of the following events: a burn prior to discharge; a fall within the facility; wrong site/side/patient/procedure/implant event; or a hospital transfer or hospital admission upon discharge from the facility. (G8907) Patient did not have preoperative order for IV antibiotic SSI prophylaxis. (G8918)  

## 2011-08-19 ENCOUNTER — Ambulatory Visit: Payer: Commercial Managed Care - PPO | Admitting: Internal Medicine

## 2011-08-22 ENCOUNTER — Encounter: Payer: Self-pay | Admitting: Gastroenterology

## 2011-11-08 ENCOUNTER — Other Ambulatory Visit: Payer: Self-pay | Admitting: Internal Medicine

## 2011-11-28 ENCOUNTER — Ambulatory Visit (INDEPENDENT_AMBULATORY_CARE_PROVIDER_SITE_OTHER): Payer: Commercial Managed Care - PPO | Admitting: Internal Medicine

## 2011-11-28 ENCOUNTER — Encounter: Payer: Self-pay | Admitting: Internal Medicine

## 2011-11-28 VITALS — BP 152/110 | HR 56 | Temp 98.2°F | Ht 63.0 in | Wt 162.8 lb

## 2011-11-28 DIAGNOSIS — I10 Essential (primary) hypertension: Secondary | ICD-10-CM

## 2011-11-28 MED ORDER — LISINOPRIL 20 MG PO TABS: 20.0000 mg | ORAL_TABLET | Freq: Every day | ORAL | Status: DC

## 2011-11-28 NOTE — Patient Instructions (Addendum)
It was good to see you today. Increase lisinopril to 20mg  daily for blood pressure control, no change on atenolol Your prescription(s) have been submitted to your pharmacy. Please take as directed and contact our office if you believe you are having problem(s) with the medication(s). Other medications reviewed, no changes at this time. Please schedule followup in 1 week for blood pressure recheck and med titration as needed, call sooner if problems.

## 2011-11-28 NOTE — Progress Notes (Signed)
  Subjective:    Patient ID: [name], female    DOB: [birth year], 46 y.o.   MRN: [id number]  HPI  Here for uncontrolled blood pressure Reports taking medications as prescribed Associated with mild headache but denies chest pain Recent ongoing evaluation and stress test with cardiologist in [place]> test stopped early due to blood pressure elevation  Past Medical History  Diagnosis Date  . Hypertension   . CAD (coronary artery disease) 2009    Dr Linton [facility], Heart Cath  . Hyperlipidemia   . Statin intolerance   . Acute pancreatitis 2011  . GERD (gastroesophageal reflux disease)   . Thyroid nodule     Review of Systems  Constitutional: Negative for fatigue and unexpected weight change.  Respiratory: Negative for cough and shortness of breath.   Cardiovascular: Negative for chest pain, palpitations and leg swelling.       Objective:   Physical Exam  BP 152/110  Pulse 56  Temp(Src) 98.2 F (36.8 C) (Oral)  Ht 5\' 3"  (1.6 m)  Wt 162 lb 12.8 oz (73.846 kg)  BMI 28.84 kg/m2  SpO2 98% Wt Readings from Last 3 Encounters:  11/28/11 162 lb 12.8 oz (73.846 kg)  08/16/11 163 lb (73.936 kg)  07/25/11 163 lb 12.8 oz (74.299 kg)   General: No acute distress Lungs: Clear to auscultation, no wheeze or crackle Cardiovascular: Regular rate and rhythm, no edema, no murmur  Lab Results  Component Value Date   WBC 7.2 07/07/2011   HGB 13.5 07/07/2011   HCT 41.1 07/07/2011   PLT 267.0 07/07/2011   GLUCOSE 98 02/24/2011   CHOL 165 02/20/2011   TRIG 168* 02/20/2011   HDL 49 02/20/2011   LDLCALC  Value: 82         02/20/2011   ALT 15 02/24/2011   AST 17 02/24/2011   NA 138 02/24/2011   K 3.7 02/24/2011   CL 103 02/24/2011   CREATININE 0.62 02/24/2011   BUN 10 02/24/2011   CO2 29 02/24/2011   TSH 4.116 02/20/2011       Assessment & Plan:  See problem list. Medications and labs reviewed today.

## 2011-11-28 NOTE — Assessment & Plan Note (Signed)
BP Readings from Last 3 Encounters:  11/28/11 152/110  08/16/11 141/90  07/25/11 138/90   atenolol dose reduced 06/2011 due to bradycardia Added low dose ACEI but not yet at goal Increase ACEI dose now to moderate dose  rechcek in 1 week and titrate as needed Pt to call sooner if problems

## 2011-11-29 ENCOUNTER — Telehealth: Payer: Self-pay

## 2011-11-29 NOTE — Telephone Encounter (Signed)
Patient called stating that she was seen 11/28/11 by Dr Felicity Coyer and lisinopril was increased from 5mg  to 20 mg due to elevated BP. Per pt she had her BP checked today and report 149/79 and pulse of 40. She would like to know if medication needs to be adjusted to low heart rate. Per her employer they are concerned that its too low. Thanks

## 2011-11-29 NOTE — Telephone Encounter (Signed)
Lisinopril does not lower HR - if HR remains <50, will need to stop atenolol, not lisinopril - but no changes recommended at this time

## 2011-11-29 NOTE — Telephone Encounter (Signed)
Patient can be reached at [phone] ext [phone]

## 2011-11-29 NOTE — Telephone Encounter (Signed)
Patient notified per MD.

## 2011-12-09 ENCOUNTER — Ambulatory Visit (INDEPENDENT_AMBULATORY_CARE_PROVIDER_SITE_OTHER): Payer: Commercial Managed Care - PPO | Admitting: Internal Medicine

## 2011-12-09 ENCOUNTER — Encounter: Payer: Self-pay | Admitting: Internal Medicine

## 2011-12-09 VITALS — BP 150/100 | HR 80 | Temp 98.3°F | Resp 16 | Wt 164.0 lb

## 2011-12-09 DIAGNOSIS — E785 Hyperlipidemia, unspecified: Secondary | ICD-10-CM

## 2011-12-09 DIAGNOSIS — I2510 Atherosclerotic heart disease of native coronary artery without angina pectoris: Secondary | ICD-10-CM

## 2011-12-09 DIAGNOSIS — I10 Essential (primary) hypertension: Secondary | ICD-10-CM

## 2011-12-09 MED ORDER — OLMESARTAN-AMLODIPINE-HCTZ 40-5-12.5 MG PO TABS: 1.0000 | ORAL_TABLET | ORAL | Status: DC

## 2011-12-09 MED ORDER — ASPIRIN 81 MG PO CHEW: 81.0000 mg | CHEWABLE_TABLET | Freq: Every day | ORAL | Status: DC

## 2011-12-09 MED ORDER — VITAMIN D 1000 UNITS PO TABS: 1000.0000 [IU] | ORAL_TABLET | Freq: Every day | ORAL | Status: DC

## 2011-12-09 NOTE — Progress Notes (Signed)
Patient ID: RANI IDLER, female   DOB: Aug 21, 1966, 46 y.o.   MRN: [id number]  Subjective:    Patient ID: [place] SARINANA, female    DOB: July 08, 1966, 46 y.o.   MRN: [id number]  Hypertension Associated symptoms include headaches and palpitations. Pertinent negatives include no chest pain.    C/o elev BP. F/u CAD, palpitations  Wt Readings from Last 3 Encounters:  12/09/11 164 lb (74.39 kg)  11/28/11 162 lb 12.8 oz (73.846 kg)  08/16/11 163 lb (73.936 kg)   BP Readings from Last 3 Encounters:  12/09/11 150/100  11/28/11 152/110  08/16/11 141/90      Review of Systems  Constitutional: Negative for fever, chills, activity change, appetite change, fatigue and unexpected weight change.  HENT: Negative for congestion, mouth sores and sinus pressure.   Eyes: Negative for visual disturbance.  Respiratory: Negative for apnea, cough and chest tightness.   Cardiovascular: Positive for palpitations. Negative for chest pain.  Gastrointestinal: Negative for nausea, vomiting, abdominal pain, diarrhea, constipation, blood in stool, abdominal distention and rectal pain.  Genitourinary: Negative for frequency, hematuria, flank pain, vaginal bleeding, difficulty urinating and vaginal pain.  Musculoskeletal: Negative for back pain and gait problem.  Skin: Negative for pallor and rash.  Neurological: Positive for headaches. Negative for dizziness, tremors, weakness and numbness.  Psychiatric/Behavioral: Negative for confusion and sleep disturbance. The patient is not nervous/anxious.        Objective:   Physical Exam  Constitutional: She appears well-developed and well-nourished. No distress.       NAD Non-toxic  HENT:  Head: Normocephalic.  Right Ear: External ear normal.  Left Ear: External ear normal.  Nose: Nose normal.  Mouth/Throat: Oropharynx is clear and moist.  Eyes: Conjunctivae are normal. Pupils are equal, round, and reactive to light. Right eye exhibits no discharge. Left  eye exhibits no discharge.  Neck: Normal range of motion. Neck supple. No JVD present. No tracheal deviation present. No thyromegaly present.  Cardiovascular: Normal rate, regular rhythm and normal heart sounds.   Pulmonary/Chest: No stridor. No respiratory distress. She has no wheezes.  Abdominal: Soft. Bowel sounds are normal. She exhibits no distension and no mass. There is no tenderness. There is no rebound and no guarding.  Genitourinary:       Per ER  Musculoskeletal: She exhibits no edema and no tenderness.  Lymphadenopathy:    She has no cervical adenopathy.  Neurological: She displays normal reflexes. No cranial nerve deficit. She exhibits normal muscle tone. Coordination normal.  Skin: No rash noted. No erythema.  Psychiatric: She has a normal mood and affect. Her behavior is normal. Judgment and thought content normal.          Assessment & Plan:

## 2011-12-09 NOTE — Assessment & Plan Note (Signed)
  On diet  

## 2011-12-09 NOTE — Assessment & Plan Note (Signed)
3/13 worse - see meds

## 2011-12-09 NOTE — Assessment & Plan Note (Addendum)
Start ASA  Dr Rudolpho Sevin -in [facility] and ECHO 3/13

## 2011-12-13 ENCOUNTER — Other Ambulatory Visit: Payer: Self-pay | Admitting: *Deleted

## 2011-12-13 NOTE — Telephone Encounter (Signed)
Received fax pt wanting 90 day on lisinopril. Med was d/c & change to tribenzor fax request back with info... 12/13/11@4 :36pm/LMB

## 2012-01-25 ENCOUNTER — Encounter: Payer: Self-pay | Admitting: Internal Medicine

## 2012-01-25 ENCOUNTER — Ambulatory Visit (INDEPENDENT_AMBULATORY_CARE_PROVIDER_SITE_OTHER): Payer: Commercial Managed Care - PPO | Admitting: Internal Medicine

## 2012-01-25 VITALS — BP 140/92 | HR 71 | Temp 97.8°F | Ht 63.0 in | Wt 163.0 lb

## 2012-01-25 DIAGNOSIS — Z23 Encounter for immunization: Secondary | ICD-10-CM

## 2012-01-25 DIAGNOSIS — S61209A Unspecified open wound of unspecified finger without damage to nail, initial encounter: Secondary | ICD-10-CM

## 2012-01-25 DIAGNOSIS — S61019A Laceration without foreign body of unspecified thumb without damage to nail, initial encounter: Secondary | ICD-10-CM

## 2012-01-25 NOTE — Patient Instructions (Addendum)
It was good to see you today. No need for stitches and no evidence for infection on your thumb cut Soak your finger for 5 minutes daily in warm soapy water, then apply liquid band aid and cover with bandage as much as possible Tetanus booster updated today  Fingertip Laceration The treatment of fingertip injuries depends on how large the cut is and whether the bone or nail tissue has been damaged. Amputations of the skin over the tip of the finger that is smaller than a dime (smaller than 1cm) will usually heal very well from the sides without any treatment other than cleaning the wound and changing the dressing. Keep your hand elevated for the next 2 to 3 days to reduce pain and swelling. A splint over the fingertip may be needed to protect your injury. If your cut is being allowed to heal in from the sides, you should soak it in warm water and change the dressing daily.   You may need a tetanus shot if:  You cannot remember when you had your last tetanus shot.   You have never had a tetanus shot.   The injury broke your skin.  If you got a tetanus shot, your arm may swell, get red, and feel warm to the touch. This is common and not a problem. If you need a tetanus shot and you choose not to have one, there is a rare chance of getting tetanus. Sickness from tetanus can be serious. SEEK MEDICAL CARE IF:    There are any signs of infection: increased redness, swelling, and pain, or sometimes pus drainage.  Document Released: 10/20/2004 Document Revised: 09/01/2011 Document Reviewed: 10/16/2008 [facility] Patient Information 2012 [place], [place].

## 2012-01-25 NOTE — Progress Notes (Signed)
  Subjective:    Patient ID: [name], female    DOB: [birth year], 46 y.o.   MRN: [id number]  HPI  complains of cut on R thumb Onset 5 days ago Precipitated by opening hard plastic packaging Bleeding stopped with pressure after 15 minutes but wound "opens and catches" with use of hands/activity as "band aid won't stick to the tip" No fever, pain, drainage or swelling of thumb  Past Medical History  Diagnosis Date  . Hypertension   . CAD (coronary artery disease) 2009    Dr Linton Ham -[facility], Heart Cath  . Hyperlipidemia   . Statin intolerance   . Acute pancreatitis 2011  . GERD (gastroesophageal reflux disease)   . Thyroid nodule     Review of Systems  Musculoskeletal: Negative for joint swelling.  Skin: Negative for color change, pallor and rash.  Neurological: Negative for numbness.       Objective:   Physical Exam BP 140/92  Pulse 71  Temp(Src) 97.8 F (36.6 C) (Oral)  Ht 5\' 3"  (1.6 m)  Wt 163 lb (73.936 kg)  BMI 28.87 kg/m2  SpO2 98% Gen: NAD MSkel - R thumb with full range of motion and ligamentous function intact, neurovasc intact distally Skin - Right thumb tip with 6 mm deep  vertical laceration, nailbed intact - well approximated without amputation - no redness, swelling, drainage or odor       Assessment & Plan:  Thumb laceration, mild - Td updated today - no indication for wound closure - educated on wound care to including soaking and dressing - to call if problems

## 2012-02-27 ENCOUNTER — Ambulatory Visit: Payer: Commercial Managed Care - PPO | Admitting: Internal Medicine

## 2012-03-02 ENCOUNTER — Ambulatory Visit: Payer: Commercial Managed Care - PPO | Admitting: Internal Medicine

## 2012-03-06 ENCOUNTER — Telehealth: Payer: Self-pay | Admitting: Internal Medicine

## 2012-03-06 ENCOUNTER — Other Ambulatory Visit (INDEPENDENT_AMBULATORY_CARE_PROVIDER_SITE_OTHER): Payer: Commercial Managed Care - PPO

## 2012-03-06 DIAGNOSIS — Z0000 Encounter for general adult medical examination without abnormal findings: Secondary | ICD-10-CM

## 2012-03-06 LAB — URINALYSIS
Ketones, ur: NEGATIVE
Leukocytes, UA: NEGATIVE
Specific Gravity, Urine: 1.01 (ref 1.000–1.030)
Urobilinogen, UA: 0.2 (ref 0.0–1.0)

## 2012-03-06 LAB — CBC WITH DIFFERENTIAL/PLATELET
Eosinophils Relative: 1.8 % (ref 0.0–5.0)
Lymphocytes Relative: 37.1 % (ref 12.0–46.0)
Monocytes Relative: 6.5 % (ref 3.0–12.0)
Neutrophils Relative %: 52.6 % (ref 43.0–77.0)
Platelets: 275 10*3/uL (ref 150.0–400.0)
WBC: 8.4 10*3/uL (ref 4.5–10.5)

## 2012-03-06 NOTE — Telephone Encounter (Signed)
Needs labs

## 2012-03-07 ENCOUNTER — Other Ambulatory Visit: Payer: Self-pay | Admitting: *Deleted

## 2012-03-07 ENCOUNTER — Other Ambulatory Visit (INDEPENDENT_AMBULATORY_CARE_PROVIDER_SITE_OTHER): Payer: Commercial Managed Care - PPO

## 2012-03-07 DIAGNOSIS — E785 Hyperlipidemia, unspecified: Secondary | ICD-10-CM

## 2012-03-07 LAB — HEPATIC FUNCTION PANEL
Albumin: 4 g/dL (ref 3.5–5.2)
Alkaline Phosphatase: 41 U/L (ref 39–117)
Total Protein: 7.6 g/dL (ref 6.0–8.3)

## 2012-03-07 LAB — LIPID PANEL
Cholesterol: 189 mg/dL (ref 0–200)
HDL: 62.7 mg/dL (ref >39.00)
LDL Cholesterol: 96 mg/dL (ref 0–99)
Triglycerides: 138 mg/dL (ref 0.0–149.0)
Triglycerides: 71 mg/dL (ref 0.0–149.0)
VLDL: 27.6 mg/dL (ref 0.0–40.0)

## 2012-03-07 LAB — BASIC METABOLIC PANEL
BUN: 13 mg/dL (ref 6–23)
Creatinine, Ser: 0.8 mg/dL (ref 0.4–1.2)
GFR: 105.4 mL/min (ref >60.00)
Potassium: 4.7 mEq/L (ref 3.5–5.1)

## 2012-03-07 LAB — TSH: TSH: 2.47 u[IU]/mL (ref 0.35–5.50)

## 2012-03-08 ENCOUNTER — Encounter: Payer: Self-pay | Admitting: Internal Medicine

## 2012-03-08 ENCOUNTER — Ambulatory Visit (INDEPENDENT_AMBULATORY_CARE_PROVIDER_SITE_OTHER): Payer: Commercial Managed Care - PPO | Admitting: Internal Medicine

## 2012-03-08 VITALS — BP 124/80 | HR 88 | Temp 98.5°F | Resp 16 | Wt 161.0 lb

## 2012-03-08 DIAGNOSIS — R109 Unspecified abdominal pain: Secondary | ICD-10-CM

## 2012-03-08 DIAGNOSIS — I10 Essential (primary) hypertension: Secondary | ICD-10-CM

## 2012-03-08 DIAGNOSIS — E0590 Thyrotoxicosis, unspecified without thyrotoxic crisis or storm: Secondary | ICD-10-CM

## 2012-03-08 DIAGNOSIS — I2510 Atherosclerotic heart disease of native coronary artery without angina pectoris: Secondary | ICD-10-CM

## 2012-03-08 MED ORDER — CILIDINIUM-CHLORDIAZEPOXIDE 2.5-5 MG PO CAPS: 1.0000 | ORAL_CAPSULE | Freq: Three times a day (TID) | ORAL | Status: DC | PRN

## 2012-03-08 NOTE — Assessment & Plan Note (Signed)
Continue with current prescription therapy as reflected on the Med list.  

## 2012-03-08 NOTE — Progress Notes (Signed)
Patient ID: [name], female   DOB: 1965-10-27, 46 y.o.   MRN: [id number]  Subjective:    Patient ID: [name], female    DOB: [birth year], 46 y.o.   MRN: [id number]  HPI  C/o elev BP. F/u CAD, palpitations. C/o obesity C/o abd spasms 2/wk - severe x1-2 h - long time (s/p GI eval)  Wt Readings from Last 3 Encounters:  03/08/12 161 lb (73.029 kg)  01/25/12 163 lb (73.936 kg)  12/09/11 164 lb (74.39 kg)   BP Readings from Last 3 Encounters:  03/08/12 124/80  01/25/12 140/92  12/09/11 150/100      Review of Systems  Constitutional: Negative for fever, chills, activity change, appetite change, fatigue and unexpected weight change.  HENT: Negative for congestion, mouth sores and sinus pressure.   Eyes: Negative for visual disturbance.  Respiratory: Negative for apnea, cough and chest tightness.   Gastrointestinal: Negative for nausea, vomiting, abdominal pain, diarrhea, constipation, blood in stool, abdominal distention and rectal pain.  Genitourinary: Negative for frequency, hematuria, flank pain, vaginal bleeding, difficulty urinating and vaginal pain.  Musculoskeletal: Negative for back pain and gait problem.  Skin: Negative for pallor and rash.  Neurological: Negative for dizziness, tremors, weakness and numbness.  Psychiatric/Behavioral: Negative for suicidal ideas, confusion and disturbed wake/sleep cycle. The patient is not nervous/anxious.        Objective:   Physical Exam  Constitutional: She appears well-developed and well-nourished. No distress.       NAD Obese  HENT:  Head: Normocephalic.  Right Ear: External ear normal.  Left Ear: External ear normal.  Nose: Nose normal.  Mouth/Throat: Oropharynx is clear and moist.  Eyes: Conjunctivae are normal. Pupils are equal, round, and reactive to light. Right eye exhibits no discharge. Left eye exhibits no discharge.  Neck: Normal range of motion. Neck supple. No JVD present. No tracheal deviation present.  No thyromegaly present.  Cardiovascular: Normal rate, regular rhythm and normal heart sounds.   Pulmonary/Chest: No stridor. No respiratory distress. She has no wheezes.  Abdominal: Soft. Bowel sounds are normal. She exhibits no distension and no mass. There is no tenderness. There is no rebound and no guarding.  Musculoskeletal: She exhibits no edema and no tenderness.  Lymphadenopathy:    She has no cervical adenopathy.  Neurological: She displays normal reflexes. No cranial nerve deficit. She exhibits normal muscle tone. Coordination normal.  Skin: No rash noted. No erythema.  Psychiatric: She has a normal mood and affect. Her behavior is normal. Judgment and thought content normal.    Lab Results  Component Value Date   WBC 8.4 03/06/2012   HGB 13.6 03/06/2012   HCT 41.0 03/06/2012   PLT 275.0 03/06/2012   GLUCOSE 82 03/06/2012   CHOL 172 03/07/2012   TRIG 71.0 03/07/2012   HDL 62.70 03/07/2012   LDLCALC 95 03/07/2012   ALT 15 03/06/2012   AST 20 03/06/2012   NA 137 03/06/2012   K 4.7 03/06/2012   CL 101 03/06/2012   CREATININE 0.8 03/06/2012   BUN 13 03/06/2012   CO2 28 03/06/2012   TSH 2.47 03/06/2012         Assessment & Plan:

## 2012-03-08 NOTE — Assessment & Plan Note (Signed)
10/12 - severe - spasms (likely IBS). CT abd and labs all wnl on 10/5 ([facility] visit). S/p GI eval Dr Russella Dar Librax prn  Potential benefits of a long term benzodiazepines  use as well as potential risks  and complications were explained to the patient and were aknowledged.

## 2012-04-02 ENCOUNTER — Telehealth: Payer: Self-pay | Admitting: Internal Medicine

## 2012-04-02 NOTE — Telephone Encounter (Signed)
Caller: Sanaa/Patient; PCP: Sonda Primes; CB#: [phone]; ; ; Call regarding Pain; Onset- 04/01/12 Pt states she got choked yesterday on pancakes and since that time has had left sided chest pain. "A burning sensation" She is wondering if anything is in her lung. Pt does have a hx of PVC's  and has had ablation surgery before she states. Emergent s/s of Chest pain protocol states to ED. Pt states she is going to call Cardiologist now and see if they recommend ED or come into the office to see them 1st.

## 2012-04-02 NOTE — Telephone Encounter (Signed)
Pepcid complete 1 tab chew tid OV this wk Can she swallow pills? If yes - take OTC Prilosec 1 bid as well  Thx

## 2012-04-03 ENCOUNTER — Ambulatory Visit (INDEPENDENT_AMBULATORY_CARE_PROVIDER_SITE_OTHER): Payer: Commercial Managed Care - PPO | Admitting: Internal Medicine

## 2012-04-03 ENCOUNTER — Ambulatory Visit (INDEPENDENT_AMBULATORY_CARE_PROVIDER_SITE_OTHER)
Admission: RE | Admit: 2012-04-03 | Discharge: 2012-04-03 | Disposition: A | Discharge status: Home / Self Care | Payer: Commercial Managed Care - PPO | Source: Ambulatory Visit | Attending: Internal Medicine | Admitting: Internal Medicine

## 2012-04-03 ENCOUNTER — Encounter: Payer: Self-pay | Admitting: Internal Medicine

## 2012-04-03 VITALS — BP 140/90 | HR 76 | Temp 98.1°F | Resp 16 | Wt 162.0 lb

## 2012-04-03 DIAGNOSIS — K219 Gastro-esophageal reflux disease without esophagitis: Secondary | ICD-10-CM

## 2012-04-03 DIAGNOSIS — T17908A Unspecified foreign body in respiratory tract, part unspecified causing other injury, initial encounter: Secondary | ICD-10-CM

## 2012-04-03 DIAGNOSIS — J9801 Acute bronchospasm: Secondary | ICD-10-CM

## 2012-04-03 DIAGNOSIS — I10 Essential (primary) hypertension: Secondary | ICD-10-CM

## 2012-04-03 DIAGNOSIS — I2510 Atherosclerotic heart disease of native coronary artery without angina pectoris: Secondary | ICD-10-CM

## 2012-04-03 DIAGNOSIS — R0789 Other chest pain: Secondary | ICD-10-CM

## 2012-04-03 MED ORDER — OMEPRAZOLE 40 MG PO CPDR: 40.0000 mg | DELAYED_RELEASE_CAPSULE | Freq: Every day | ORAL | Status: DC

## 2012-04-03 MED ORDER — BUDESONIDE-FORMOTEROL FUMARATE 160-4.5 MCG/ACT IN AERO: 2.0000 | INHALATION_SPRAY | Freq: Two times a day (BID) | RESPIRATORY_TRACT | Status: DC

## 2012-04-03 MED ORDER — LOSARTAN POTASSIUM 100 MG PO TABS: 100.0000 mg | ORAL_TABLET | Freq: Every day | ORAL | Status: DC

## 2012-04-03 MED ORDER — CEFUROXIME AXETIL 500 MG PO TABS: ORAL_TABLET | ORAL | Status: AC

## 2012-04-03 NOTE — Assessment & Plan Note (Signed)
CXR D/c'd ACE She saw her cardiologist yesterday

## 2012-04-03 NOTE — Assessment & Plan Note (Signed)
Continue with current prescription therapy as reflected on the Med list.  

## 2012-04-03 NOTE — Assessment & Plan Note (Signed)
See meds D/c ACE

## 2012-04-03 NOTE — Progress Notes (Signed)
Subjective:    Patient ID: [name], female    DOB: 07-12-1966, 46 y.o.   MRN: [id number]  Chest Pain  This is a new problem. The current episode started in the past 7 days. The onset quality is sudden. The problem occurs constantly. The problem has been rapidly improving. The pain is present in the substernal region. The pain is at a severity of 5/10. The pain is mild. The quality of the pain is described as dull. The pain does not radiate. Associated symptoms include a cough. Pertinent negatives include no dizziness, nausea or vomiting. The pain is aggravated by food. The treatment provided no relief.  Her past medical history is significant for CAD.  She likely aspirated a small piece of the pancake coughed x 30 min. She almost passed out, wheezed. She has been having coughing spells since then.   F/u elev BP, CAD, palpitations. C/o obesity  Wt Readings from Last 3 Encounters:  04/03/12 162 lb (73.483 kg)  03/08/12 161 lb (73.029 kg)  01/25/12 163 lb (73.936 kg)   BP Readings from Last 3 Encounters:  04/03/12 140/90  03/08/12 124/80  01/25/12 140/92      Review of Systems  Constitutional: Negative for chills, activity change, appetite change, fatigue and unexpected weight change.  HENT: Negative for congestion, mouth sores and sinus pressure.   Eyes: Negative for visual disturbance.  Respiratory: Positive for cough. Negative for apnea and chest tightness.   Cardiovascular: Positive for chest pain.  Gastrointestinal: Negative for nausea, vomiting, diarrhea, constipation, blood in stool, abdominal distention and rectal pain.  Genitourinary: Negative for frequency, hematuria, flank pain, vaginal bleeding, difficulty urinating and vaginal pain.  Musculoskeletal: Negative for gait problem.  Skin: Negative for pallor and rash.  Neurological: Negative for dizziness and tremors.  Psychiatric/Behavioral: Negative for suicidal ideas, confusion and disturbed wake/sleep cycle. The  patient is not nervous/anxious.        Objective:   Physical Exam  Constitutional: She appears well-developed and well-nourished. No distress.       NAD Obese  HENT:  Head: Normocephalic.  Right Ear: External ear normal.  Left Ear: External ear normal.  Nose: Nose normal.  Mouth/Throat: Oropharynx is clear and moist.  Eyes: Conjunctivae are normal. Pupils are equal, round, and reactive to light. Right eye exhibits no discharge. Left eye exhibits no discharge.  Neck: Normal range of motion. Neck supple. No JVD present. No tracheal deviation present. No thyromegaly present.  Cardiovascular: Normal rate, regular rhythm and normal heart sounds.   Pulmonary/Chest: No stridor. No respiratory distress. She has wheezes. She exhibits tenderness.  Abdominal: Soft. Bowel sounds are normal. She exhibits no distension and no mass. There is no tenderness. There is no rebound and no guarding.  Musculoskeletal: She exhibits no edema and no tenderness.  Lymphadenopathy:    She has no cervical adenopathy.  Neurological: She displays normal reflexes. No cranial nerve deficit. She exhibits normal muscle tone. Coordination normal.  Skin: No rash noted. No erythema.  Psychiatric: She has a normal mood and affect. Her behavior is normal. Judgment and thought content normal.    Lab Results  Component Value Date   WBC 8.4 03/06/2012   HGB 13.6 03/06/2012   HCT 41.0 03/06/2012   PLT 275.0 03/06/2012   GLUCOSE 82 03/06/2012   CHOL 172 03/07/2012   TRIG 71.0 03/07/2012   HDL 62.70 03/07/2012   LDLCALC 95 03/07/2012   ALT 15 03/06/2012   AST 20 03/06/2012   NA 137  03/06/2012   K 4.7 03/06/2012   CL 101 03/06/2012   CREATININE 0.8 03/06/2012   BUN 13 03/06/2012   CO2 28 03/06/2012   TSH 2.47 03/06/2012    I personally provided the Symbicort inhaler use teaching. After the teaching patient was able to demonstrate it's use effectively. All questions were answered      Assessment & Plan:

## 2012-04-03 NOTE — Assessment & Plan Note (Signed)
Given Prilosec

## 2012-04-03 NOTE — Telephone Encounter (Signed)
Pt informed

## 2012-04-03 NOTE — Assessment & Plan Note (Signed)
7/7 CXR Ceftin Symbicort

## 2012-05-04 ENCOUNTER — Encounter: Payer: Self-pay | Admitting: Internal Medicine

## 2012-05-18 ENCOUNTER — Ambulatory Visit: Payer: Commercial Managed Care - PPO | Admitting: Internal Medicine

## 2012-09-07 ENCOUNTER — Encounter: Payer: Self-pay | Admitting: Internal Medicine

## 2012-09-07 ENCOUNTER — Ambulatory Visit (INDEPENDENT_AMBULATORY_CARE_PROVIDER_SITE_OTHER): Payer: Commercial Managed Care - PPO | Admitting: Internal Medicine

## 2012-09-07 VITALS — BP 134/86 | HR 74 | Temp 98.0°F | Ht 63.0 in | Wt 162.0 lb

## 2012-09-07 DIAGNOSIS — J019 Acute sinusitis, unspecified: Secondary | ICD-10-CM

## 2012-09-07 MED ORDER — AMOXICILLIN-POT CLAVULANATE 875-125 MG PO TABS: 1.0000 | ORAL_TABLET | Freq: Two times a day (BID) | ORAL | Status: DC

## 2012-09-07 NOTE — Patient Instructions (Addendum)

## 2012-09-07 NOTE — Progress Notes (Signed)
HPI  Pt presents to the clinic today with c/o cough, nasal congestion, headache, sinus pain and pressure for 4 days. She has been taking ibuprofen without much relief. When she goes out in the cold, it makes it worse. 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  Review of Systems    Past Medical History  Diagnosis Date  . Hypertension   . CAD (coronary artery disease) 2009    Dr Linton Ham -[facility], Heart Cath  . Hyperlipidemia   . Statin intolerance   . Acute pancreatitis 2011  . GERD (gastroesophageal reflux disease)   . Thyroid nodule     Family History  Problem Relation Age of Onset  . Hypertension Father   . Prostate cancer Father   . Heart attack Father   . Heart attack Cousin   . Diabetes Maternal Grandmother     History   Social History  . Marital Status: Single    Spouse Name: N/A    Number of Children: 4  . Years of Education: N/A   Occupational History  . DISPATCHER [employer]  . Student     F/T   Social History Main Topics  . Smoking status: Never Smoker   . Smokeless tobacco: Never Used  . Alcohol Use: No  . Drug Use: No  . Sexually Active: Not on file   Other Topics Concern  . Not on file   Social History Narrative   FAMILY HISTORYHistory of hypertensionHistory of prostate cancer 1st degree relative <50D, S ovar. CARegular exercise - YESGYN Dr Allena Katz - [facility]    Allergies  Allergen Reactions  . Atorvastatin     REACTION: achy  . Lisinopril     cough     Constitutional: Positive headache, fatigue and fever. Denies abrupt weight changes.  HEENT:  Positive eye pain, pressure behind the eyes, facial pain, nasal congestion and sore throat. Denies eye redness, ear pain, ringing in the ears, wax buildup, runny nose or bloody nose. Respiratory: Positive cough and thick green sputum production. Denies difficulty breathing or shortness of breath.  Cardiovascular: Denies chest pain, chest tightness, palpitations or swelling in  the hands or feet.   No other specific complaints in a complete review of systems (except as listed in HPI above).  Objective:    General: Appears her stated age, well developed, well nourished in NAD. HEENT: Head: normal shape and size; Eyes: sclera white, no icterus, conjunctiva pink, PERRLA and EOMs intact; Ears: Tm's gray and intact, normal light reflex; Nose: mucosa pink and moist, septum midline; Throat/Mouth: + PND. Teeth present, mucosa pink and moist, no exudate noted, no lesions or ulcerations noted.  Neck: Mild cervical lymphadenopathy. Neck supple, trachea midline. No massses, lumps or thyromegaly present.  Cardiovascular: Normal rate and rhythm. S1,S2 noted.  No murmur, rubs or gallops noted. No JVD or BLE edema. No carotid bruits noted. Pulmonary/Chest: Normal effort and positive vesicular breath sounds. No respiratory distress. No wheezes, rales or ronchi noted.      Assessment & Plan:   Acute bacterial sinusitis  Can use a Neti Pot which can be purchased from your local drug store. Flonase 2 sprays each nostril for 3 days and then as needed. Augmentin BID for 10 days  RTC as needed or if symptoms persist.

## 2012-09-13 ENCOUNTER — Ambulatory Visit (INDEPENDENT_AMBULATORY_CARE_PROVIDER_SITE_OTHER): Payer: Commercial Managed Care - PPO | Admitting: Physician Assistant

## 2012-09-13 ENCOUNTER — Telehealth: Payer: Self-pay | Admitting: Gastroenterology

## 2012-09-13 ENCOUNTER — Other Ambulatory Visit (INDEPENDENT_AMBULATORY_CARE_PROVIDER_SITE_OTHER): Payer: Commercial Managed Care - PPO

## 2012-09-13 ENCOUNTER — Ambulatory Visit: Payer: Commercial Managed Care - PPO | Admitting: Internal Medicine

## 2012-09-13 ENCOUNTER — Encounter: Payer: Self-pay | Admitting: Physician Assistant

## 2012-09-13 VITALS — BP 120/70 | HR 70 | Ht 62.0 in | Wt 162.0 lb

## 2012-09-13 DIAGNOSIS — R110 Nausea: Secondary | ICD-10-CM

## 2012-09-13 DIAGNOSIS — R1013 Epigastric pain: Secondary | ICD-10-CM

## 2012-09-13 LAB — LIPASE: Lipase: 25 U/L (ref 11.0–59.0)

## 2012-09-13 LAB — HEPATIC FUNCTION PANEL
Bilirubin, Direct: 0.1 mg/dL (ref 0.0–0.3)
Total Bilirubin: 1.2 mg/dL (ref 0.3–1.2)

## 2012-09-13 LAB — CBC WITH DIFFERENTIAL/PLATELET
Eosinophils Relative: 1.4 % (ref 0.0–5.0)
HCT: 41.3 % (ref 36.0–46.0)
Hemoglobin: 14.1 g/dL (ref 12.0–15.0)
Lymphs Abs: 2.3 10*3/uL (ref 0.7–4.0)
Monocytes Relative: 6.3 % (ref 3.0–12.0)
Neutro Abs: 5 10*3/uL (ref 1.4–7.7)
Platelets: 267 10*3/uL (ref 150.0–400.0)
RBC: 4.49 Mil/uL (ref 3.87–5.11)
WBC: 8 10*3/uL (ref 4.5–10.5)

## 2012-09-13 NOTE — Patient Instructions (Addendum)
Please go to the basement level to have your labs drawn.  Try to eat a bland low fat diet. We have given you samples of Creon.  Take 2 tablets with each meal.  We have scheduled the MRI at [facility] for 09-24-2012 at [facility].  Arrive at 11:45AM.  Have nothing to eat or drink after 8:00 AM.   Make an appointment to follow up with Dr. Russella Dar or Amy.

## 2012-09-13 NOTE — Progress Notes (Signed)
Subjective:    Patient ID: [name], female    DOB: 1965/10/08, 46 y.o.   MRN: [id number]  HPI [name] is a pleasant 46 year old African American female known to Dr. Russella Dar who was last seen in the office about a year ago her age she had complaints of epigastric pain and underwent EGD which showed multiple small gastric polyps but was unable otherwise unremarkable. Multiple biopsies were taken and these were consistent with fundic gland polyps. Patient is status post cholecystectomy in 2009, she has history of hypertension hyperlipidemia, thyroid disease and coronary artery disease. She had a hospitalization a year or so ago for upper abdominal pain and had an elevated amylase and lipase but apparently no definite diagnosis of pancreatitis was made. CT scan of the abdomen and pelvis was done in 2012 and showed no acute findings in the abdomen and pelvis, pancreas was read as normal Comes in today stating that she has had some ongoing abdominal discomfort over the past year which has been worse over the past 2 months. She complains of epigastric pain which radiates into her back and is associated with nausea without vomiting she has apparently had some intermittent fevers over the past year and has episodes of diarrhea 2-3 times per week. She denies any heartburn or indigestion and had been tried on acid blockers without any improvement in her symptoms. She had been given a course of Creon in the past and says that she did find that somewhat beneficial. She says her pain is always worse after eating usually within 15-20 minutes of a meal. Her appetite is fine her weight has been stable she does not drink any alcohol a regular basis.   Review of Systems  Constitutional: Negative.   HENT: Negative.   Eyes: Negative.   Respiratory: Negative.   Gastrointestinal: Positive for nausea, abdominal pain and diarrhea.  Genitourinary: Negative.   Musculoskeletal: Negative.   Neurological: Negative.    Hematological: Negative.   Psychiatric/Behavioral: Negative.    Outpatient Prescriptions Prior to Visit  Medication Sig Dispense Refill  . amoxicillin-clavulanate (AUGMENTIN) 875-125 MG per tablet Take 1 tablet by mouth 2 (two) times daily.  20 tablet  0  . atenolol (TENORMIN) 25 MG tablet Take 25 mg by mouth daily.      . cholecalciferol (VITAMIN D) 1000 UNITS tablet Take 1 tablet (1,000 Units total) by mouth daily.  100 tablet  3  . hydrochlorothiazide (HYDRODIURIL) 25 MG tablet Take 25 mg by mouth daily.      Marland Kitchen lisinopril (PRINIVIL,ZESTRIL) 20 MG tablet Take 20 mg by mouth daily.      . [DISCONTINUED] aspirin (ASPIRIN CHILDRENS) 81 MG chewable tablet Chew 1 tablet (81 mg total) by mouth daily.  100 tablet  3  . [DISCONTINUED] losartan (COZAAR) 100 MG tablet Take 1 tablet (100 mg total) by mouth daily.  90 tablet  3  . [DISCONTINUED] omeprazole (PRILOSEC) 40 MG capsule Take 1 capsule (40 mg total) by mouth daily.  30 capsule  1   Last reviewed on 09/13/2012  3:36 PM by Sammuel Cooper, PA  Allergies  Allergen Reactions  . Atorvastatin     REACTION: achy  . Lisinopril     cough   Patient Active Problem List  Diagnosis  . THYROID NODULE, RIGHT  . HYPERTHYROIDISM  . HYPERLIPIDEMIA  . HYPERTENSION  . CORONARY ARTERY DISEASE  . GERD  . ACUTE PANCREATITIS  . BACK PAIN, LUMBAR  . LEG PAIN, BILATERAL  . PARESTHESIA  .  EDEMA  . DYSPNEA  . DYSPHAGIA UNSPECIFIED  . ABDOMINAL PAIN  . HYPERGLYCEMIA  . Aspiration Into Respiratory Tract  . Cough due to bronchospasm  . Chest pain, atypical   History  Substance Use Topics  . Smoking status: Never Smoker   . Smokeless tobacco: Never Used  . Alcohol Use: No       Objective:   Physical Exam  well-developed African American female in no acute distress, pleasant blood pressure 120/70 pulse 70 height 5 foot 2 weight 162. HEENT; nontraumatic normocephalic EOMI PERRLA sclera anicteric,Neck; Supple no JVD, Cardiovascular; regular rate  and rhythm with S1-S2 no murmur or gallop, Pulmonary; clear bilaterally, Abdomen ;soft she is tender in the epigastrium there is no guarding or rebound no palpable mass or hepatosplenomegaly bowel sounds are active, Rectal; exam not done, Extremities; no clubbing cyanosis or edema skin warm and dry, Psych; mood and affect normal and appropriate.       Assessment & Plan:   #48 46 year old female status post cholecystectomy with persistent complaints of upper abdominal pain radiating to the back over the past one year. Symptoms are exacerbated postprandially and associated with nausea. Previous EGD was unremarkable as to cause of her symptoms and she has been unresponsive to PPI therapy. Will need to rule out underlying pancreatic disease, chronic pancreatitis etc. #2 thyroid disease #3 coronary artery disease #4 hypertension #5 hyperlipidemia  Plan; CBC with differential, hepatic panel, amylase and lipase today. Schedule for MRI ABD/MRCP Low fat diet Start trial of Creon 36,000-2 before each meal-patient was given samples and if she finds these helpful we'll send as a prescription Followup with Dr. Russella Dar or myself in 3 weeks

## 2012-09-13 NOTE — Telephone Encounter (Signed)
Patient calling to report bloating, diarrhea or abdominal pain that is not going away. States the symptoms started on Saturday. The pain is above the belly button and it feels like "you have been punched in the stomach." She is having diarrhea 3 times/day. She is on Augmentin for URI. Scheduled patient today at 2:30 PM with Mike Gip, PA.

## 2012-09-13 NOTE — Progress Notes (Signed)
Reviewed and agree with management plan.  Labria Wos T. Sheralee Qazi, MD FACG 

## 2012-09-24 ENCOUNTER — Other Ambulatory Visit: Payer: Self-pay | Admitting: Physician Assistant

## 2012-09-24 ENCOUNTER — Ambulatory Visit (HOSPITAL_COMMUNITY)
Admission: RE | Admit: 2012-09-24 | Discharge: 2012-09-24 | Disposition: A | Discharge status: Home / Self Care | Payer: Commercial Managed Care - PPO | Source: Ambulatory Visit | Attending: Physician Assistant | Admitting: Physician Assistant

## 2012-09-24 DIAGNOSIS — R1013 Epigastric pain: Secondary | ICD-10-CM

## 2012-09-24 DIAGNOSIS — R110 Nausea: Secondary | ICD-10-CM

## 2012-09-24 DIAGNOSIS — Z9089 Acquired absence of other organs: Secondary | ICD-10-CM | POA: Insufficient documentation

## 2012-09-24 LAB — POCT I-STAT, CHEM 8
Creatinine, Ser: 0.9 mg/dL (ref 0.50–1.10)
Hemoglobin: 13.9 g/dL (ref 12.0–15.0)
Sodium: 140 mEq/L (ref 135–145)
TCO2: 24 mmol/L (ref 0–100)

## 2012-09-24 MED ORDER — GADOBENATE DIMEGLUMINE 529 MG/ML IV SOLN
15.0000 mL | Freq: Once | INTRAVENOUS | Status: AC | PRN
  Administered 2012-09-24: 15 mL via INTRAVENOUS

## 2012-12-28 ENCOUNTER — Telehealth: Payer: Self-pay | Admitting: Endocrinology

## 2012-12-28 NOTE — Telephone Encounter (Signed)
It has been a while since we addressed the thyroid. Please advise ov

## 2012-12-28 NOTE — Telephone Encounter (Signed)
Pt called, she believes she is due for a follow up ultrasound, is this correct? Will Dr. Everardo All need to order this? Please call patient,  [phone]. Patient aware that it will be Monday before we can get her a call back / [name]

## 2012-12-28 NOTE — Telephone Encounter (Signed)
Please schedule an ov 

## 2013-01-04 ENCOUNTER — Encounter: Payer: Self-pay | Admitting: Endocrinology

## 2013-01-04 ENCOUNTER — Ambulatory Visit (INDEPENDENT_AMBULATORY_CARE_PROVIDER_SITE_OTHER): Payer: Commercial Managed Care - PPO | Admitting: Endocrinology

## 2013-01-04 VITALS — BP 132/80 | HR 67 | Wt 164.0 lb

## 2013-01-04 DIAGNOSIS — E041 Nontoxic single thyroid nodule: Secondary | ICD-10-CM

## 2013-01-04 NOTE — Progress Notes (Signed)
Subjective:    Patient ID: [name], female    DOB: Nov 09, 1965, 47 y.o.   MRN: [id number]  HPI In 2010, pt had i-131 rx for hyperthyroidism in 2010; she did not have [place], but nuc med scan showed diffuse uptake.  Her TFT normalized, so she has never been on synthroid.  Pt says she had an [place] at [facility] showing a thyroid nodule.  She denies swelling at the anterior neck.   Past Medical History  Diagnosis Date  . Hypertension   . CAD (coronary artery disease) 2009    Dr Linton Ham -[facility], Heart Cath  . Hyperlipidemia   . Statin intolerance   . Acute pancreatitis 2011  . GERD (gastroesophageal reflux disease)   . Thyroid nodule     Past Surgical History  Procedure Laterality Date  . Tubal ligation    . Cholecystectomy  2008    History   Social History  . Marital Status: Single    Spouse Name: N/A    Number of Children: 4  . 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 Smoking status: Never Smoker   . Smokeless tobacco: Never Used  . Alcohol Use: No  . Drug Use: No  . Sexually Active: Not on file   Other Topics Concern  . Not on file   Social History Narrative   FAMILY HISTORY   History of hypertension   History of prostate cancer 1st degree relative <50   D, S ovar. [place]      Regular exercise - YES      GYN Dr Allena Katz - [place]    Current Outpatient Prescriptions on File Prior to Visit  Medication Sig Dispense Refill  . amoxicillin-clavulanate (AUGMENTIN) 875-125 MG per tablet Take 1 tablet by mouth 2 (two) times daily.  20 tablet  0  . atenolol (TENORMIN) 25 MG tablet Take 25 mg by mouth daily.      . hydrochlorothiazide (HYDRODIURIL) 25 MG tablet Take 25 mg by mouth daily.      Marland Kitchen lisinopril (PRINIVIL,ZESTRIL) 20 MG tablet Take 20 mg by mouth daily.      Marland Kitchen aspirin 81 MG chewable tablet Chew 81 mg by mouth. Prn when she feels pvc's coming on      .  cholecalciferol (VITAMIN D) 1000 UNITS tablet Take 1 tablet (1,000 Units total) by mouth daily.  100 tablet  3   No current facility-administered medications on file prior to visit.    Allergies  Allergen Reactions  . Atorvastatin     REACTION: achy  . Lisinopril     cough    Family History  Problem Relation Age of Onset  . Hypertension Father   . Prostate cancer Father   . Heart attack Father   . Heart attack Cousin   . Diabetes Maternal Grandmother   . Colon cancer Father     BP 132/80  Pulse 67  Wt 164 lb (74.39 kg)  BMI 29.99 kg/m2  SpO2 98%   Review of Systems She has slight pain at the anterior neck.    Objective:   Physical Exam VITAL SIGNS:  See vs page GENERAL: no distress NECK: There is no palpable thyroid enlargement.  No thyroid nodule is palpable.  No palpable lymphadenopathy at the anterior neck.  Lab Results  Component Value Date   TSH 2.47 03/06/2012  Assessment & Plan:  Hyperthyroidism, resolved with i-131 rx Thyroid nodule reported by pt from [place] a few years ago.  ? Any change

## 2013-01-04 NOTE — Patient Instructions (Addendum)
Please sign a release of information for the 2010 thyroid US at [facility] Also, let's repeat the ultrasound, to see if there has been any change.  you will receive a phone call, about a day and time for an appointment. Please continue to get annual thyroid blood test with dr plotnikov. I would be happy to see you back here whenever you want.

## 2013-01-14 ENCOUNTER — Ambulatory Visit
Admission: RE | Admit: 2013-01-14 | Discharge: 2013-01-14 | Disposition: A | Discharge status: Home / Self Care | Payer: Commercial Managed Care - PPO | Source: Ambulatory Visit | Attending: Endocrinology | Admitting: Endocrinology

## 2013-01-14 DIAGNOSIS — E041 Nontoxic single thyroid nodule: Secondary | ICD-10-CM

## 2013-02-27 ENCOUNTER — Telehealth: Payer: Self-pay | Admitting: *Deleted

## 2013-02-27 DIAGNOSIS — E785 Hyperlipidemia, unspecified: Secondary | ICD-10-CM

## 2013-02-27 DIAGNOSIS — I10 Essential (primary) hypertension: Secondary | ICD-10-CM

## 2013-02-27 DIAGNOSIS — E0590 Thyrotoxicosis, unspecified without thyrotoxic crisis or storm: Secondary | ICD-10-CM

## 2013-02-27 NOTE — Telephone Encounter (Signed)
Left msg on triage want to come in for her 3 month labs check. Requesting to have her thyroid, kidneys, and pancreas...Raechel Chute

## 2013-02-28 NOTE — Telephone Encounter (Signed)
Ok TSH, CMET,UA, lipids Thx

## 2013-02-28 NOTE — Telephone Encounter (Signed)
Notified pt md ok labs...lmb

## 2013-03-06 ENCOUNTER — Other Ambulatory Visit (INDEPENDENT_AMBULATORY_CARE_PROVIDER_SITE_OTHER): Payer: Commercial Managed Care - PPO

## 2013-03-06 DIAGNOSIS — I10 Essential (primary) hypertension: Secondary | ICD-10-CM

## 2013-03-06 DIAGNOSIS — E785 Hyperlipidemia, unspecified: Secondary | ICD-10-CM

## 2013-03-06 DIAGNOSIS — E0590 Thyrotoxicosis, unspecified without thyrotoxic crisis or storm: Secondary | ICD-10-CM

## 2013-03-06 LAB — URINALYSIS, ROUTINE W REFLEX MICROSCOPIC
Nitrite: POSITIVE
Specific Gravity, Urine: 1.015 (ref 1.000–1.030)
Total Protein, Urine: NEGATIVE
pH: 6 (ref 5.0–8.0)

## 2013-03-06 LAB — BASIC METABOLIC PANEL
BUN: 12 mg/dL (ref 6–23)
Calcium: 9.7 mg/dL (ref 8.4–10.5)
GFR: 82.12 mL/min (ref >60.00)
Glucose, Bld: 84 mg/dL (ref 70–99)
Sodium: 139 mEq/L (ref 135–145)

## 2013-05-30 ENCOUNTER — Ambulatory Visit: Payer: Self-pay | Admitting: Certified Nurse Midwife

## 2013-06-07 ENCOUNTER — Encounter: Payer: Self-pay | Admitting: Internal Medicine

## 2013-06-28 ENCOUNTER — Encounter: Payer: Self-pay | Admitting: Certified Nurse Midwife

## 2013-07-01 ENCOUNTER — Ambulatory Visit (INDEPENDENT_AMBULATORY_CARE_PROVIDER_SITE_OTHER): Payer: Commercial Managed Care - PPO | Admitting: Certified Nurse Midwife

## 2013-07-01 ENCOUNTER — Encounter: Payer: Self-pay | Admitting: Certified Nurse Midwife

## 2013-07-01 VITALS — BP 102/62 | HR 68 | Resp 16 | Ht 61.75 in | Wt 164.0 lb

## 2013-07-01 DIAGNOSIS — Z01419 Encounter for gynecological examination (general) (routine) without abnormal findings: Secondary | ICD-10-CM

## 2013-07-01 DIAGNOSIS — Z0000 Encounter for general adult medical examination without abnormal findings: Secondary | ICD-10-CM

## 2013-07-01 LAB — POCT URINALYSIS DIPSTICK
Blood, UA: NEGATIVE
Protein, UA: NEGATIVE
Urobilinogen, UA: NEGATIVE

## 2013-07-01 NOTE — Patient Instructions (Signed)

## 2013-07-01 NOTE — Progress Notes (Signed)
47 y.o. [id number] Divorced African American Fe here for annual exam. Periods normal, no issues. Last cardiac ablation stopped chest pain and heart palpitations. Sees cardiologist,Endocrine for thyroid and PCP all yearly for management. All labs with MD. Have been normal per patient. Has noticed slight increase of facial hair above lip only, sister has same problem Patient is not concerned with hair issue. No health issues today.  Patient's last menstrual period was 06/11/2013.          Sexually active: no  The current method of family planning is BTL status.    Exercising: yes  kettle ball & yoga Smoker:  no  Health Maintenance: Pap:  02-09-12 neg HPV HR neg MMG:  05/14/13 normal Colonoscopy: none BMD:   none TDaP:  2013 Labs: Poct urine-neg, Hgb- Self breast exam: done monthly   reports that she has never smoked. She has never used smokeless tobacco. She reports that she does not drink alcohol or use illicit drugs.  Past Medical History  Diagnosis Date  . Hypertension   . CAD (coronary artery disease) 2009    Dr Linton Ham -[facility], Heart Cath  . Hyperlipidemia   . Statin intolerance   . Acute pancreatitis 2011  . GERD (gastroesophageal reflux disease)   . Migraines   . Thyroid disease     hypothyroid, thyroid nodule  . Cancer     thyroid treated with radiation  . STD (sexually transmitted disease)     CHL 20 yrs ago  . Anemia   . PVC (premature ventricular contraction)     Past Surgical History  Procedure Laterality Date  . Cholecystectomy  2008  . Cardiac catheterization      unable to do cardiac cath but does stress echo 11/22/11  . Tubal ligation  1995    BTL  . Heart ablation  2006,2010    times 2, follow-up for v-tack    Current Outpatient Prescriptions  Medication Sig Dispense Refill  . atenolol (TENORMIN) 25 MG tablet Take 25 mg by mouth daily.      . Cholecalciferol (VITAMIN D PO) Take 1,000 Int'l Units by mouth.      . hydrochlorothiazide (HYDRODIURIL) 25  MG tablet Take 25 mg by mouth daily.      Marland Kitchen lisinopril (PRINIVIL,ZESTRIL) 20 MG tablet Take 20 mg by mouth daily.       No current facility-administered medications for this visit.    Family History  Problem Relation Age of Onset  . Hypertension Father   . Prostate cancer Father   . Heart attack Father   . Colon cancer Father   . Heart attack Cousin   . Diabetes Maternal Grandmother   . Hypertension Mother   . Diabetes Mother   . Cancer Sister     ovarian  . Diabetes Brother     ROS:  Pertinent items are noted in HPI.  Otherwise, a comprehensive ROS was negative.  Exam:   BP 102/62  Pulse 68  Resp 16  Ht 5' 1.75" (1.568 m)  Wt 164 lb (74.39 kg)  BMI 30.26 kg/m2  LMP 06/11/2013 Height: 5' 1.75" (156.8 cm)  Ht Readings from Last 3 Encounters:  07/01/13 5' 1.75" (1.568 m)  09/13/12 5\' 2"  (1.575 m)  09/07/12 5\' 3"  (1.6 m)    General appearance: alert, cooperative and appears stated age Head: Normocephalic, without obvious abnormality, atraumatic Neck: no adenopathy, supple, symmetrical, trachea midline and thyroid normal to inspection and palpation Lungs: clear to auscultation bilaterally Breasts: normal appearance,  no masses or tenderness, No nipple retraction or dimpling, No nipple discharge or bleeding, No axillary or supraclavicular adenopathy Heart: regular rate and rhythm Abdomen: soft, non-tender; no masses,  no organomegaly Extremities: extremities normal, atraumatic, no cyanosis or edema Skin: Skin color, texture, turgor normal. No rashes or lesions Lymph nodes: Cervical, supraclavicular, and axillary nodes normal. No abnormal inguinal nodes palpated Neurologic: Grossly normal   Pelvic: External genitalia:  no lesions              Urethra:  normal appearing urethra with no masses, tenderness or lesions              Bartholin's and Skene's: normal                 Vagina: normal appearing vagina with normal color and discharge, no lesions              Cervix:  normal, non tender              Pap taken: no Bimanual Exam:  Uterus:  enlarged, 12 weeks size and firmness noted upper area consistent with fibroid history              Adnexa: normal adnexa and no mass, fullness, tenderness               Rectovaginal: Confirms               Anus:  normal sphincter tone, no lesions  A:  Well Woman with normal exam  Contraception BTL not sexually active  History of fibroid, not symptomatic  History of Thyroid nodule treated with RI, with follow up with Endocrine  History of PVC treated with ablation, with improvement sees Cardiology for management  HTN,Elevated cholesterol with PCP management  P:   Reviewed  health and wellness pertinent to exam  Aware of need to advise if warning signs with fibroid  Continue follow up as indicated for other health issues  Pap smear as per guidelines   Mammogram yearly pap smear not taken today  counseled on breast self exam, mammography screening, adequate intake of calcium and vitamin D, diet and exercise  return annually or prn  An After Visit Summary was printed and given to the patient.

## 2013-07-02 NOTE — Progress Notes (Signed)
Note reviewed, agree with plan.  Makesha Belitz, MD  

## 2013-08-01 ENCOUNTER — Other Ambulatory Visit: Payer: Self-pay

## 2013-11-14 ENCOUNTER — Ambulatory Visit (INDEPENDENT_AMBULATORY_CARE_PROVIDER_SITE_OTHER): Payer: Commercial Managed Care - PPO | Admitting: Internal Medicine

## 2013-11-14 ENCOUNTER — Encounter: Payer: Self-pay | Admitting: Internal Medicine

## 2013-11-14 VITALS — BP 140/92 | HR 72 | Temp 97.0°F | Resp 16 | Wt 167.0 lb

## 2013-11-14 DIAGNOSIS — I10 Essential (primary) hypertension: Secondary | ICD-10-CM

## 2013-11-14 DIAGNOSIS — M531 Cervicobrachial syndrome: Secondary | ICD-10-CM

## 2013-11-14 DIAGNOSIS — M5481 Occipital neuralgia: Secondary | ICD-10-CM | POA: Insufficient documentation

## 2013-11-14 MED ORDER — PREDNISONE 10 MG PO TABS: ORAL_TABLET | ORAL | Status: DC

## 2013-11-14 MED ORDER — TRAMADOL HCL 50 MG PO TABS: 50.0000 mg | ORAL_TABLET | Freq: Two times a day (BID) | ORAL | Status: DC | PRN

## 2013-11-14 MED ORDER — METHYLPREDNISOLONE ACETATE 20 MG/ML IJ SUSP: 20.0000 mg | Freq: Once | INTRAMUSCULAR | Status: DC

## 2013-11-14 NOTE — Assessment & Plan Note (Signed)
BP Readings from Last 3 Encounters:  11/14/13 140/92  07/01/13 102/62  01/04/13 132/80

## 2013-11-14 NOTE — Progress Notes (Signed)
Pre visit review using our clinic review tool, if applicable. No additional management support is needed unless otherwise documented below in the visit note. 

## 2013-11-14 NOTE — Assessment & Plan Note (Addendum)
See procedure Prednisone 10 mg: take 4 tabs a day x 3 days; then 3 tabs a day x 4 days; then 2 tabs a day x 4 days, then 1 tab a day x 6 days, then stop. Take pc. Tramadol prn Soft collar Off work till Monday

## 2013-11-14 NOTE — Progress Notes (Signed)
Subjective:   Headache  This is a new problem. The current episode started 1 to 4 weeks ago (3 wks). The problem occurs constantly. The problem has been gradually worsening. The pain is located in the left unilateral region. The pain radiates to the face. The pain quality is similar to prior headaches (on the R side in Jan 2015). The quality of the pain is described as pulsating, stabbing and throbbing. The pain is at a severity of 10/10. The pain is severe. Associated symptoms include photophobia and a visual change. Pertinent negatives include no facial sweating, sinus pressure or weakness.  She likely aspirated a small piece of the pancake coughed x 30 min. She almost passed out, wheezed. She has been having coughing spells since then.   F/u elev BP, CAD, palpitations. C/o obesity  Wt Readings from Last 3 Encounters:  11/14/13 167 lb (75.751 kg)  07/01/13 164 lb (74.39 kg)  01/04/13 164 lb (74.39 kg)   BP Readings from Last 3 Encounters:  11/14/13 140/92  07/01/13 102/62  01/04/13 132/80      Review of Systems  Constitutional: Negative for chills, activity change, appetite change, fatigue and unexpected weight change.  HENT: Negative for congestion, mouth sores and sinus pressure.   Eyes: Positive for photophobia. Negative for visual disturbance.  Respiratory: Negative for apnea and chest tightness.   Gastrointestinal: Negative for diarrhea, constipation, blood in stool, abdominal distention and rectal pain.  Genitourinary: Negative for frequency, hematuria, flank pain, vaginal bleeding, difficulty urinating and vaginal pain.  Musculoskeletal: Negative for gait problem.  Skin: Negative for pallor and rash.  Neurological: Positive for headaches. Negative for tremors and weakness.  Psychiatric/Behavioral: Negative for suicidal ideas, confusion and sleep disturbance. The patient is not nervous/anxious.        Objective:   Physical Exam  Constitutional: She appears  well-developed and well-nourished. No distress.  NAD Obese  HENT:  Head: Normocephalic.  Right Ear: External ear normal.  Left Ear: External ear normal.  Nose: Nose normal.  Mouth/Throat: Oropharynx is clear and moist.  Eyes: Conjunctivae are normal. Pupils are equal, round, and reactive to light. Right eye exhibits no discharge. Left eye exhibits no discharge.  Neck: Normal range of motion. Neck supple. No JVD present. No tracheal deviation present. No thyromegaly present.  Cardiovascular: Normal rate, regular rhythm and normal heart sounds.   Pulmonary/Chest: No stridor. No respiratory distress. She has wheezes. She exhibits tenderness.  Abdominal: Soft. Bowel sounds are normal. She exhibits no distension and no mass. There is no tenderness. There is no rebound and no guarding.  Musculoskeletal: She exhibits no edema and no tenderness.  Lymphadenopathy:    She has no cervical adenopathy.  Neurological: She displays normal reflexes. No cranial nerve deficit. She exhibits normal muscle tone. Coordination normal.  Skin: No rash noted. No erythema.  Psychiatric: She has a normal mood and affect. Her behavior is normal. Judgment and thought content normal.  L occip nerve area and L occip-temp area is tender. No rash  Lab Results  Component Value Date   WBC 8.0 09/13/2012   HGB 13.9 09/24/2012   HCT 41.0 09/24/2012   PLT 267.0 09/13/2012   GLUCOSE 84 03/06/2013   CHOL 197 03/06/2013   TRIG 78.0 03/06/2013   HDL 59.10 03/06/2013   LDLCALC 122* 03/06/2013   ALT 17 09/13/2012   AST 15 09/13/2012   NA 139 03/06/2013   K 3.7 03/06/2013   CL 104 03/06/2013   CREATININE 0.9 03/06/2013  BUN 12 03/06/2013   CO2 23 03/06/2013   TSH 1.30 03/06/2013     Procedure Note :     occipital nerve Injection/block:   Indication : L occipital neuralgia.   Risks including unsuccessful procedure , bleeding, infection, bruising, skin atrophy and others were explained to the patient in detail as well as the  benefits. Informed consent was obtained and signed.   Tthe patient was placed in a comfortable position. L occip nerve exit point was marked and  the skin was prepped with Betadine and alcohol. 11/2 inch 25-gauge needle was used. The needle was advanced perpendicular to the skin and I injected the site with 2 mL of 2% lidocaine and 20 mg of Depo-Medrol in a usual fashion.  Band-Aid applied.   Tolerated well. Complications: None. Good pain relief following the procedure.       Assessment & Plan:

## 2013-11-14 NOTE — Patient Instructions (Signed)
Contour pillow  

## 2013-12-04 ENCOUNTER — Telehealth: Payer: Self-pay | Admitting: *Deleted

## 2013-12-04 NOTE — Telephone Encounter (Signed)
Patient phoned requesting for cholesterol levels to be sent to her cardiologist (no epic access)  Dr. Larinda Buttery [phone]).  For efficiency, labs printed & being mailed to patient-no release on file for cardiologist.  CB# [phone]

## 2014-01-09 ENCOUNTER — Ambulatory Visit (INDEPENDENT_AMBULATORY_CARE_PROVIDER_SITE_OTHER): Payer: Commercial Managed Care - PPO | Admitting: Family Medicine

## 2014-01-09 ENCOUNTER — Encounter: Payer: Self-pay | Admitting: Family Medicine

## 2014-01-09 VITALS — BP 134/98 | HR 73 | Temp 98.7°F | Wt 167.0 lb

## 2014-01-09 DIAGNOSIS — J069 Acute upper respiratory infection, unspecified: Secondary | ICD-10-CM

## 2014-01-09 DIAGNOSIS — J40 Bronchitis, not specified as acute or chronic: Secondary | ICD-10-CM

## 2014-01-09 DIAGNOSIS — J309 Allergic rhinitis, unspecified: Secondary | ICD-10-CM

## 2014-01-09 MED ORDER — AZITHROMYCIN 250 MG PO TABS: ORAL_TABLET | ORAL | Status: DC

## 2014-01-09 MED ORDER — BUDESONIDE-FORMOTEROL FUMARATE 160-4.5 MCG/ACT IN AERO: 2.0000 | INHALATION_SPRAY | Freq: Two times a day (BID) | RESPIRATORY_TRACT | Status: AC

## 2014-01-09 MED ORDER — FLUTICASONE PROPIONATE 50 MCG/ACT NA SUSP: 2.0000 | Freq: Every day | NASAL | Status: DC

## 2014-01-09 MED ORDER — BENZONATATE 100 MG PO CAPS: 100.0000 mg | ORAL_CAPSULE | Freq: Two times a day (BID) | ORAL | Status: DC | PRN

## 2014-01-09 NOTE — Patient Instructions (Signed)
INSTRUCTIONS FOR UPPER RESPIRATORY INFECTION:  -plenty of rest and fluids  -As we discussed, we have prescribed a new medication for you at this appointment. We discussed the common and serious potential adverse effects of this medication and you can review these and more with the pharmacist when you pick up your medication.  Please follow the instructions for use carefully and notify us immediately if you have any problems taking this medication.  -nasal saline wash 2-3 times daily (use prepackaged nasal saline or bottled/distilled water if making your own)   -clean nose with nasal saline before using the nasal steroid or sinex  -can use afrin OR sinex nasal spray for drainage and nasal congestion - but do NOT use longer then 3-4 days  -can use tylenol or ibuprofen as directed for aches and sorethroat  -in the winter time, using a humidifier at night is helpful (please follow cleaning instructions)  -if you are taking a cough medication - use only as directed, may also try a teaspoon of honey to coat the throat and throat lozenges  -for sore throat, salt water gargles can help  -follow up if you have fevers, facial pain, tooth pain, difficulty breathing or are worsening or not getting better in 5-7 days

## 2014-01-09 NOTE — Progress Notes (Signed)
Pre visit review using our clinic review tool, if applicable. No additional management support is needed unless otherwise documented below in the visit note. 

## 2014-01-09 NOTE — Progress Notes (Signed)
Chief Complaint  Patient presents with  . Cough    chest discomfort, fever     HPI:  Acute visit for URI: -reports: started about 2 weeks ago, nasal congestion, PND, coughing, wheezing at times, laryngitis, low grade temp, allergies, sneezing -denies: tick bite, flu exposure, ebola risks, chronic lung disease, fever >100.4, SOB  ROS: See pertinent positives and negatives per HPI.  Past Medical History  Diagnosis Date  . Hypertension   . CAD (coronary artery disease) 2009    Dr Olegario Shearer -[facility], Heart Cath  . Hyperlipidemia   . Statin intolerance   . Acute pancreatitis 2011  . GERD (gastroesophageal reflux disease)   . Migraines   . Thyroid disease     hypothyroid, thyroid nodule  . Cancer     thyroid treated with radiation  . STD (sexually transmitted disease)     CHL 20 yrs ago  . Anemia   . PVC (premature ventricular contraction)     Past Surgical History  Procedure Laterality Date  . Cholecystectomy  2008  . Cardiac catheterization      unable to do cardiac cath but does stress echo 11/22/11  . Tubal ligation  1995    BTL  . Heart ablation  2006,2010    times 2, follow-up for v-tack    Family History  Problem Relation Age of Onset  . Hypertension Father   . Prostate cancer Father   . Heart attack Father   . Colon cancer Father   . Heart attack Cousin   . Diabetes Maternal Grandmother   . Hypertension Mother   . Diabetes Mother   . Cancer Sister     ovarian  . Diabetes Brother     History   Social History  . Marital Status: Divorced    Spouse Name: N/A    Number of Children: 68  . 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 Smoking status: Never Smoker   . Smokeless tobacco: Never Used  . Alcohol Use: No  . Drug Use: No  . Sexual Activity: No     Comment: BTL   Other Topics Concern  . None   Social History Narrative   FAMILY HISTORY    History of hypertension   History of prostate cancer 1st degree relative <50   D, S ovar. [place]      Regular exercise - YES      GYN Dr Posey Pronto - [place]    Current outpatient prescriptions:atenolol (TENORMIN) 25 MG tablet, Take 25 mg by mouth daily., Disp: , Rfl: ;  Cholecalciferol (VITAMIN D PO), Take 1,000 Int'l Units by mouth., Disp: , Rfl: ;  hydrochlorothiazide (HYDRODIURIL) 25 MG tablet, Take 25 mg by mouth daily., Disp: , Rfl: ;  lisinopril (PRINIVIL,ZESTRIL) 20 MG tablet, Take 20 mg by mouth daily., Disp: , Rfl:  predniSONE (DELTASONE) 10 MG tablet, Prednisone 10 mg: take 4 tabs a day x 3 days; then 3 tabs a day x 4 days; then 2 tabs a day x 4 days, then 1 tab a day x 6 days, then stop. Take pc., Disp: 38 tablet, Rfl: 1;  traMADol (ULTRAM) 50 MG tablet, Take 1-2 tablets (50-100 mg total) by mouth 2 (two) times daily as needed for severe pain., Disp: 60 tablet, Rfl: 1 azithromycin (ZITHROMAX) 250 MG tablet, 2 tabs day 1, then one tab daily, Disp: 6  tablet, Rfl: 0;  benzonatate (TESSALON) 100 MG capsule, Take 1 capsule (100 mg total) by mouth 2 (two) times daily as needed for cough., Disp: 20 capsule, Rfl: 0;  budesonide-formoterol (SYMBICORT) 160-4.5 MCG/ACT inhaler, Inhale 2 puffs into the lungs 2 (two) times daily., Disp: 1 Inhaler, Rfl: 0 fluticasone (FLONASE) 50 MCG/ACT nasal spray, Place 2 sprays into both nostrils daily., Disp: 16 g, Rfl: 1 Current facility-administered medications:methylPREDNISolone acetate (DEPO-MEDROL) 20 MG/ML injection 20 mg, 20 mg, Intramuscular, Once, Cassandria Anger, MD  EXAM:  Filed Vitals:   01/09/14 1014  BP: 134/98  Pulse: 73  Temp: 98.7 F (37.1 C)    Body mass index is 30.81 kg/(m^2).  GENERAL: vitals reviewed and listed above, alert, oriented, appears well hydrated and in no acute distress  HEENT: atraumatic, conjunttiva clear, no obvious abnormalities on inspection of external nose and ears, normal appearance of ear canals and TMs, clear nasal  congestion, mild post oropharyngeal erythema with PND, no tonsillar edema or exudate, no sinus TTP  NECK: no obvious masses on inspection  LUNGS: clear to auscultation bilaterally, no wheezes, rales or rhonchi, good air movement  CV: HRRR, no peripheral edema  MS: moves all extremities without noticeable abnormality  PSYCH: pleasant and cooperative, no obvious depression or anxiety  ASSESSMENT AND PLAN:  Discussed the following assessment and plan:  Allergic rhinitis - Plan: fluticasone (FLONASE) 50 MCG/ACT nasal spray  Bronchitis - Plan: budesonide-formoterol (SYMBICORT) 160-4.5 MCG/ACT inhaler  Upper respiratory infection - Plan: azithromycin (ZITHROMAX) 250 MG tablet, benzonatate (TESSALON) 100 MG capsule  -Patient advised to return or notify a doctor immediately if symptoms worsen or persist or new concerns arise.  Patient Instructions  INSTRUCTIONS FOR UPPER RESPIRATORY INFECTION:  -plenty of rest and fluids  -As we discussed, we have prescribed a new medication for you at this appointment. We discussed the common and serious potential adverse effects of this medication and you can review these and more with the pharmacist when you pick up your medication.  Please follow the instructions for use carefully and notify us immediately if you have any problems taking this medication.  -nasal saline wash 2-3 times daily (use prepackaged nasal saline or bottled/distilled water if making your own)   -clean nose with nasal saline before using the nasal steroid or sinex  -can use afrin OR sinex nasal spray for drainage and nasal congestion - but do NOT use longer then 3-4 days  -can use tylenol or ibuprofen as directed for aches and sorethroat  -in the winter time, using a humidifier at night is helpful (please follow cleaning instructions)  -if you are taking a cough medication - use only as directed, may also try a teaspoon of honey to coat the throat and throat lozenges  -for  sore throat, salt water gargles can help  -follow up if you have fevers, facial pain, tooth pain, difficulty breathing or are worsening or not getting better in 5-7 days      Lucretia Kern

## 2014-07-28 ENCOUNTER — Encounter: Payer: Self-pay | Admitting: Family Medicine

## 2015-03-27 ENCOUNTER — Ambulatory Visit (INDEPENDENT_AMBULATORY_CARE_PROVIDER_SITE_OTHER): Payer: Commercial Managed Care - PPO | Admitting: Internal Medicine

## 2015-03-27 ENCOUNTER — Encounter: Payer: Self-pay | Admitting: Internal Medicine

## 2015-03-27 VITALS — BP 156/82 | HR 70 | Temp 97.7°F | Ht 62.0 in | Wt 180.0 lb

## 2015-03-27 DIAGNOSIS — I10 Essential (primary) hypertension: Secondary | ICD-10-CM | POA: Diagnosis not present

## 2015-03-27 DIAGNOSIS — I2510 Atherosclerotic heart disease of native coronary artery without angina pectoris: Secondary | ICD-10-CM

## 2015-03-27 DIAGNOSIS — M5481 Occipital neuralgia: Secondary | ICD-10-CM

## 2015-03-27 MED ORDER — TRAMADOL HCL 50 MG PO TABS: 50.0000 mg | ORAL_TABLET | Freq: Two times a day (BID) | ORAL | Status: DC | PRN

## 2015-03-27 MED ORDER — PREDNISONE 10 MG PO TABS: ORAL_TABLET | ORAL | Status: DC

## 2015-03-27 MED ORDER — LISINOPRIL 20 MG PO TABS: 20.0000 mg | ORAL_TABLET | Freq: Two times a day (BID) | ORAL | Status: DC

## 2015-03-27 NOTE — Progress Notes (Signed)
   Subjective:   Neck Pain  This is a recurrent problem. The pain is associated with nothing. The pain is present in the left side. The quality of the pain is described as burning. The pain is moderate. She has tried acetaminophen and NSAIDs for the symptoms. The treatment provided mild relief.  She likely aspirated a small piece of the pancake coughed x 30 min. She almost passed out, wheezed. She has been having coughing spells since then.   F/u elev BP, CAD, palpitations. C/o obesity  Wt Readings from Last 3 Encounters:  03/27/15 180 lb (81.647 kg)  01/09/14 167 lb (75.751 kg)  11/14/13 167 lb (75.751 kg)   BP Readings from Last 3 Encounters:  03/27/15 156/82  01/09/14 134/98  11/14/13 140/92      Review of Systems  Constitutional: Negative for chills, activity change, appetite change, fatigue and unexpected weight change.  HENT: Negative for congestion and mouth sores.   Eyes: Negative for visual disturbance.  Respiratory: Negative for apnea and chest tightness.   Gastrointestinal: Negative for diarrhea, constipation, blood in stool, abdominal distention and rectal pain.  Genitourinary: Negative for frequency, hematuria, flank pain, vaginal bleeding, difficulty urinating and vaginal pain.  Musculoskeletal: Negative for gait problem.  Skin: Negative for pallor and rash.  Neurological: Negative for tremors.  Psychiatric/Behavioral: Negative for suicidal ideas, confusion and sleep disturbance. The patient is not nervous/anxious.        Objective:   Physical Exam  Constitutional: She appears well-developed and well-nourished. No distress.  NAD Obese  HENT:  Head: Normocephalic.  Right Ear: External ear normal.  Left Ear: External ear normal.  Nose: Nose normal.  Mouth/Throat: Oropharynx is clear and moist.  Eyes: Conjunctivae are normal. Pupils are equal, round, and reactive to light. Right eye exhibits no discharge. Left eye exhibits no discharge.  Neck: Normal range  of motion. Neck supple. No JVD present. No tracheal deviation present. No thyromegaly present.  Cardiovascular: Normal rate, regular rhythm and normal heart sounds.   Pulmonary/Chest: No stridor. No respiratory distress. She has wheezes. She exhibits tenderness.  Abdominal: Soft. Bowel sounds are normal. She exhibits no distension and no mass. There is no tenderness. There is no rebound and no guarding.  Musculoskeletal: She exhibits no edema or tenderness.  Lymphadenopathy:    She has no cervical adenopathy.  Neurological: She displays normal reflexes. No cranial nerve deficit. She exhibits normal muscle tone. Coordination normal.  Skin: No rash noted. No erythema.  Psychiatric: She has a normal mood and affect. Her behavior is normal. Judgment and thought content normal.  L occip nerve area and L occip-temp area is tender again  No rash  Lab Results  Component Value Date   WBC 8.0 09/13/2012   HGB 13.9 09/24/2012   HCT 41.0 09/24/2012   PLT 267.0 09/13/2012   GLUCOSE 84 03/06/2013   CHOL 197 03/06/2013   TRIG 78.0 03/06/2013   HDL 59.10 03/06/2013   LDLCALC 122* 03/06/2013   ALT 17 09/13/2012   AST 15 09/13/2012   NA 139 03/06/2013   K 3.7 03/06/2013   CL 104 03/06/2013   CREATININE 0.9 03/06/2013   BUN 12 03/06/2013   CO2 23 03/06/2013   TSH 1.30 03/06/2013          Assessment & Plan:

## 2015-03-27 NOTE — Assessment & Plan Note (Signed)
Atenolol, Lisinopril, HCTZ Start Lotensin bid  BP Readings from Last 3 Encounters:  03/27/15 156/82  01/09/14 134/98  11/14/13 140/92

## 2015-03-27 NOTE — Assessment & Plan Note (Signed)
On ASA 

## 2015-03-27 NOTE — Progress Notes (Signed)
Pre visit review using our clinic review tool, if applicable. No additional management support is needed unless otherwise documented below in the visit note. 

## 2015-03-27 NOTE — Assessment & Plan Note (Addendum)
See Rx Prednisone 10 mg: take 4 tabs a day x 3 days; then 3 tabs a day x 4 days; then 2 tabs a day x 4 days, then 1 tab a day x 6 days, then stop. Take pc. Tramadol prn Ice, massage Will inject if not better

## 2015-04-01 ENCOUNTER — Emergency Department (INDEPENDENT_AMBULATORY_CARE_PROVIDER_SITE_OTHER)
Admission: EM | Admit: 2015-04-01 | Discharge: 2015-04-01 | Disposition: A | Discharge status: Nursing Home (Includes ICF) | Payer: Commercial Managed Care - PPO | Source: Home / Self Care | Attending: Family Medicine | Admitting: Family Medicine

## 2015-04-01 ENCOUNTER — Encounter (HOSPITAL_COMMUNITY): Payer: Self-pay | Admitting: *Deleted

## 2015-04-01 ENCOUNTER — Emergency Department (HOSPITAL_COMMUNITY): Payer: Commercial Managed Care - PPO

## 2015-04-01 ENCOUNTER — Encounter (HOSPITAL_COMMUNITY): Payer: Self-pay | Admitting: Emergency Medicine

## 2015-04-01 ENCOUNTER — Emergency Department (HOSPITAL_COMMUNITY)
Admission: EM | Admit: 2015-04-01 | Discharge: 2015-04-02 | Disposition: A | Discharge status: Home / Self Care | Payer: Commercial Managed Care - PPO | Attending: Emergency Medicine | Admitting: Emergency Medicine

## 2015-04-01 DIAGNOSIS — Z9889 Other specified postprocedural states: Secondary | ICD-10-CM | POA: Insufficient documentation

## 2015-04-01 DIAGNOSIS — Z7951 Long term (current) use of inhaled steroids: Secondary | ICD-10-CM | POA: Diagnosis not present

## 2015-04-01 DIAGNOSIS — I2510 Atherosclerotic heart disease of native coronary artery without angina pectoris: Secondary | ICD-10-CM | POA: Insufficient documentation

## 2015-04-01 DIAGNOSIS — R110 Nausea: Secondary | ICD-10-CM | POA: Diagnosis not present

## 2015-04-01 DIAGNOSIS — R109 Unspecified abdominal pain: Secondary | ICD-10-CM

## 2015-04-01 DIAGNOSIS — E785 Hyperlipidemia, unspecified: Secondary | ICD-10-CM | POA: Insufficient documentation

## 2015-04-01 DIAGNOSIS — Z8719 Personal history of other diseases of the digestive system: Secondary | ICD-10-CM | POA: Diagnosis not present

## 2015-04-01 DIAGNOSIS — Z8619 Personal history of other infectious and parasitic diseases: Secondary | ICD-10-CM | POA: Diagnosis not present

## 2015-04-01 DIAGNOSIS — I10 Essential (primary) hypertension: Secondary | ICD-10-CM | POA: Insufficient documentation

## 2015-04-01 DIAGNOSIS — Z85850 Personal history of malignant neoplasm of thyroid: Secondary | ICD-10-CM | POA: Insufficient documentation

## 2015-04-01 DIAGNOSIS — Z9851 Tubal ligation status: Secondary | ICD-10-CM | POA: Diagnosis not present

## 2015-04-01 DIAGNOSIS — Z3202 Encounter for pregnancy test, result negative: Secondary | ICD-10-CM | POA: Diagnosis not present

## 2015-04-01 DIAGNOSIS — Z79899 Other long term (current) drug therapy: Secondary | ICD-10-CM | POA: Insufficient documentation

## 2015-04-01 DIAGNOSIS — Z862 Personal history of diseases of the blood and blood-forming organs and certain disorders involving the immune mechanism: Secondary | ICD-10-CM | POA: Insufficient documentation

## 2015-04-01 DIAGNOSIS — R1031 Right lower quadrant pain: Secondary | ICD-10-CM | POA: Diagnosis not present

## 2015-04-01 DIAGNOSIS — G43909 Migraine, unspecified, not intractable, without status migrainosus: Secondary | ICD-10-CM | POA: Insufficient documentation

## 2015-04-01 DIAGNOSIS — Z9049 Acquired absence of other specified parts of digestive tract: Secondary | ICD-10-CM | POA: Insufficient documentation

## 2015-04-01 LAB — URINALYSIS, ROUTINE W REFLEX MICROSCOPIC
Bilirubin Urine: NEGATIVE
Glucose, UA: NEGATIVE mg/dL
Hgb urine dipstick: NEGATIVE
Ketones, ur: NEGATIVE mg/dL
LEUKOCYTES UA: NEGATIVE
NITRITE: NEGATIVE
PH: 6.5 (ref 5.0–8.0)
Protein, ur: NEGATIVE mg/dL
SPECIFIC GRAVITY, URINE: 1.013 (ref 1.005–1.030)
Urobilinogen, UA: 0.2 mg/dL (ref 0.0–1.0)

## 2015-04-01 LAB — CBC WITH DIFFERENTIAL/PLATELET
BASOS ABS: 0 10*3/uL (ref 0.0–0.1)
Basophils Relative: 0 % (ref 0–1)
Eosinophils Absolute: 0.2 10*3/uL (ref 0.0–0.7)
Eosinophils Relative: 3 % (ref 0–5)
HEMATOCRIT: 38.5 % (ref 36.0–46.0)
HEMOGLOBIN: 12.9 g/dL (ref 12.0–15.0)
LYMPHS ABS: 3.2 10*3/uL (ref 0.7–4.0)
LYMPHS PCT: 39 % (ref 12–46)
MCH: 29.6 pg (ref 26.0–34.0)
MCHC: 33.5 g/dL (ref 30.0–36.0)
MCV: 88.3 fL (ref 78.0–100.0)
MONO ABS: 0.5 10*3/uL (ref 0.1–1.0)
MONOS PCT: 6 % (ref 3–12)
Neutro Abs: 4.3 10*3/uL (ref 1.7–7.7)
Neutrophils Relative %: 52 % (ref 43–77)
Platelets: 336 10*3/uL (ref 150–400)
RBC: 4.36 MIL/uL (ref 3.87–5.11)
RDW: 14.3 % (ref 11.5–15.5)
WBC: 8.2 10*3/uL (ref 4.0–10.5)

## 2015-04-01 LAB — COMPREHENSIVE METABOLIC PANEL
ALBUMIN: 3.8 g/dL (ref 3.5–5.0)
ALK PHOS: 45 U/L (ref 38–126)
ALT: 14 U/L (ref 14–54)
AST: 19 U/L (ref 15–41)
Anion gap: 8 (ref 5–15)
BUN: 9 mg/dL (ref 6–20)
CHLORIDE: 104 mmol/L (ref 101–111)
CO2: 27 mmol/L (ref 22–32)
CREATININE: 0.77 mg/dL (ref 0.44–1.00)
Calcium: 9.5 mg/dL (ref 8.9–10.3)
Glucose, Bld: 95 mg/dL (ref 65–99)
Potassium: 4.3 mmol/L (ref 3.5–5.1)
Sodium: 139 mmol/L (ref 135–145)
Total Bilirubin: 0.5 mg/dL (ref 0.3–1.2)
Total Protein: 7.6 g/dL (ref 6.5–8.1)

## 2015-04-01 LAB — POC URINE PREG, ED: Preg Test, Ur: NEGATIVE

## 2015-04-01 LAB — LIPASE, BLOOD: Lipase: 33 U/L (ref 22–51)

## 2015-04-01 MED ORDER — IOHEXOL 300 MG/ML  SOLN
100.0000 mL | Freq: Once | INTRAMUSCULAR | Status: AC | PRN
  Administered 2015-04-01: 100 mL via INTRAVENOUS

## 2015-04-01 MED ORDER — IOHEXOL 300 MG/ML  SOLN
25.0000 mL | Freq: Once | INTRAMUSCULAR | Status: AC | PRN
  Administered 2015-04-01: 25 mL via ORAL

## 2015-04-01 MED ORDER — SODIUM CHLORIDE 0.9 % IV BOLUS (SEPSIS)
1000.0000 mL | Freq: Once | INTRAVENOUS | Status: AC
  Administered 2015-04-01: 1000 mL via INTRAVENOUS

## 2015-04-01 MED ORDER — ONDANSETRON HCL 4 MG/2ML IJ SOLN
4.0000 mg | Freq: Once | INTRAMUSCULAR | Status: AC
  Administered 2015-04-01: 4 mg via INTRAVENOUS
  Filled 2015-04-01: qty 2

## 2015-04-01 MED ORDER — MORPHINE SULFATE 4 MG/ML IJ SOLN
4.0000 mg | Freq: Once | INTRAMUSCULAR | Status: AC
  Administered 2015-04-01: 4 mg via INTRAVENOUS
  Filled 2015-04-01: qty 1

## 2015-04-01 NOTE — ED Notes (Signed)
Patient transport to CT by another nurse.

## 2015-04-01 NOTE — ED Notes (Signed)
Pt  Reports  r  Sided   abd  Pain    With  Some  Nausea   As  Well     With  Symptoms  Since  Last  Pm         Pt  Reports  A   History  Of  Pancreatitis      In  Past        Pt  Appears  In  Pain

## 2015-04-01 NOTE — ED Notes (Signed)
The reports RLQ pain with nausea onset last night , denies emesis or diarrhea , no fever or chills.

## 2015-04-01 NOTE — ED Provider Notes (Signed)
CSN: [id number]     Arrival date & time 04/01/15  1933 History   First MD Initiated Contact with Patient 04/01/15 2208     Chief Complaint  Patient presents with  . Abdominal Pain     (Consider location/radiation/quality/duration/timing/severity/associated sxs/prior Treatment) HPI Comments: Patient is a 49 year old female past medical history significant for hypertension, CAD, HLD, Anemia presenting to the ED for   Patient is a 49 y.o. female presenting with abdominal pain. The history is provided by the patient.  Abdominal Pain Pain location:  RLQ Pain quality: sharp and shooting   Pain radiates to:  Does not radiate Pain severity:  Severe Duration:  1 day Timing:  Constant Progression:  Worsening Chronicity:  New Relieved by:  Nothing Worsened by:  Movement Associated symptoms: nausea   Associated symptoms: no diarrhea, no fever, no hematuria, no vaginal bleeding, no vaginal discharge and no vomiting     Past Medical History  Diagnosis Date  . Hypertension   . CAD (coronary artery disease) 2009    Dr Olegario Shearer -[facility], Heart Cath  . Hyperlipidemia   . Statin intolerance   . Acute pancreatitis 2011  . GERD (gastroesophageal reflux disease)   . Migraines   . Thyroid disease     hypothyroid, thyroid nodule  . Cancer     thyroid treated with radiation  . STD (sexually transmitted disease)     CHL 20 yrs ago  . Anemia   . PVC (premature ventricular contraction)    Past Surgical History  Procedure Laterality Date  . Cholecystectomy  2008  . Cardiac catheterization      unable to do cardiac cath but does stress echo 11/22/11  . Tubal ligation  1995    BTL  . Heart ablation  2006,2010    times 2, follow-up for v-tack   Family History  Problem Relation Age of Onset  . Hypertension Father   . Prostate cancer Father   . Heart attack Father   . Colon cancer Father   . Heart attack Cousin   . Diabetes Maternal Grandmother   . Hypertension Mother   . Diabetes  Mother   . Cancer Sister     ovarian  . Diabetes Brother    History  Substance Use Topics  . Smoking status: Never Smoker   . Smokeless tobacco: Never Used  . Alcohol Use: No   OB History    Gravida Para Term Preterm AB TAB SAB Ectopic Multiple Living   4 4 4       4      Review of Systems  Constitutional: Negative for fever.  Gastrointestinal: Positive for nausea and abdominal pain. Negative for vomiting and diarrhea.  Genitourinary: Negative for hematuria, vaginal bleeding and vaginal discharge.      Allergies  Atorvastatin and Lisinopril  Home Medications   Prior to Admission medications   Medication Sig Start Date End Date Taking? Authorizing Provider  atenolol (TENORMIN) 25 MG tablet Take 25 mg by mouth daily.   Yes Historical Provider, MD  Cholecalciferol (VITAMIN D PO) Take 1,000 Int'l Units by mouth.   Yes Historical Provider, MD  hydrochlorothiazide (HYDRODIURIL) 25 MG tablet Take 25 mg by mouth daily.   Yes Historical Provider, MD  lisinopril (PRINIVIL,ZESTRIL) 20 MG tablet Take 1 tablet (20 mg total) by mouth 2 (two) times daily. Patient taking differently: Take 20 mg by mouth daily.  03/27/15  Yes Aleksei Plotnikov V, MD  Multiple Vitamin (MULTIVITAMIN WITH MINERALS) TABS tablet Take 1 tablet  by mouth daily.   Yes Historical Provider, MD  fluticasone (FLONASE) 50 MCG/ACT nasal spray Place 2 sprays into both nostrils daily. Patient not taking: Reported on 03/27/2015 01/09/14   Lucretia Kern, DO  ondansetron (ZOFRAN ODT) 4 MG disintegrating tablet Take 1 tablet (4 mg total) by mouth every 8 (eight) hours as needed for nausea or vomiting. 04/02/15   Treva Huyett, PA-C  predniSONE (DELTASONE) 10 MG tablet Prednisone 10 mg: take 4 tabs a day x 3 days; then 3 tabs a day x 4 days; then 2 tabs a day x 4 days, then 1 tab a day x 6 days, then stop. Take pc. 03/27/15   Cassandria Anger, MD  traMADol (ULTRAM) 50 MG tablet Take 1-2 tablets (50-100 mg total) by mouth 2 (two)  times daily as needed for severe pain. 03/27/15   Aleksei Plotnikov V, MD  traMADol (ULTRAM) 50 MG tablet Take 1 tablet (50 mg total) by mouth every 6 (six) hours as needed. 04/02/15   Jessicca Stitzer, PA-C   BP 177/97 mmHg  Pulse 48  Temp(Src) 98.1 F (36.7 C) (Oral)  Resp 16  Ht 5\' 2"  (1.575 m)  Wt 179 lb (81.194 kg)  BMI 32.73 kg/m2  SpO2 100%  LMP 03/21/2015 Physical Exam  Constitutional: She is oriented to person, place, and time. She appears well-developed and well-nourished. No distress.  HENT:  Head: Normocephalic and atraumatic.  Right Ear: External ear normal.  Left Ear: External ear normal.  Eyes: Conjunctivae are normal.  Neck: Neck supple.  Cardiovascular: Normal rate, regular rhythm and normal heart sounds.   Abdominal: Soft. Bowel sounds are normal. She exhibits no distension. There is tenderness in the right lower quadrant. There is no rigidity and no rebound.  Neurological: She is alert and oriented to person, place, and time.  Skin: Skin is warm and dry. She is not diaphoretic.  Nursing note and vitals reviewed.   ED Course  Procedures (including critical care time) Medications  morphine 4 MG/ML injection 4 mg (4 mg Intravenous Given 04/01/15 2231)  ondansetron (ZOFRAN) injection 4 mg (4 mg Intravenous Given 04/01/15 2231)  sodium chloride 0.9 % bolus 1,000 mL (0 mLs Intravenous Stopped 04/02/15 0104)  iohexol (OMNIPAQUE) 300 MG/ML solution 25 mL (25 mLs Oral Contrast Given 04/01/15 2249)  iohexol (OMNIPAQUE) 300 MG/ML solution 100 mL (100 mLs Intravenous Contrast Given 04/01/15 2346)    Labs Review Labs Reviewed  CBC WITH DIFFERENTIAL/PLATELET  COMPREHENSIVE METABOLIC PANEL  LIPASE, BLOOD  URINALYSIS, ROUTINE W REFLEX MICROSCOPIC (NOT AT [facility])  POC URINE PREG, ED    Imaging Review Ct Abdomen Pelvis W Contrast  04/02/2015   CLINICAL DATA:  49 year old female with right lower quadrant abdominal pain.  EXAM: CT ABDOMEN AND PELVIS WITH CONTRAST  TECHNIQUE:  Multidetector CT imaging of the abdomen and pelvis was performed using the standard protocol following bolus administration of intravenous contrast.  CONTRAST:  140mL OMNIPAQUE IOHEXOL 300 MG/ML  SOLN  COMPARISON:  MRI dated 09/24/2012 and CT dated 07/11/2011  FINDINGS: Evaluation is limited due to respiratory motion artifact.  The visualized lung bases are clear.  No intra-abdominal free air or free fluid.  Cholecystectomy. Minimal biliary ductal prominence, likely post cholecystectomy. The liver, pancreas, spleen, adrenal glands, kidneys, and visualized ureters appear unremarkable. There is apparent diffuse thickening of the bladder wall concerning for cystitis. Correlation with urinalysis recommended. The uterus demonstrates heterogeneous enhancement with multiple fibroids. Ultrasound may provide better evaluation of the pelvic structures. The 2 cm  left ovarian corpus luteum is noted.  The stomach is distended with oral contrast. There is apparent focal thickening of the gastric pylorus which is similar to the CT dated 07/01/2011. This may be related to underdistention or focal fold thickening related to chronic inflammation. Underlying mass is not excluded. Endoscopy may provide better evaluation. There is distention of the stomach with oral contrast as seen on the prior study. This may be related to timing of the imaging, however a partial gastric outlet obstruction or gastroparesis is not excluded. There is no evidence of small-bowel obstruction. Moderate amount of stool noted throughout the colon. The appendix appears unremarkable.  The visualized abdominal aorta and IVC appear unremarkable. The SMV and main portal vein as well as the splenic vein are patent. No lymphadenopathy.  Diastases of anterior abdominal wall musculature at the level of the umbilicus. The visualized osseous structures are unremarkable.  IMPRESSION: Focal thickening of the gastric pylorus similar to the prior CT may be related to  underdistention versus chronic inflammation. Underlying mass with possible partial gastric outlet obstruction is not excluded. Correlation with endoscopy recommended. No evidence of small bowel obstruction. Normal appendix.  Diffuse bladder wall thickening. Correlation with urinalysis recommended to exclude UTI.  Myomatous uterus.   Electronically Signed   By: Anner Crete M.D.   On: 04/02/2015 00:21     EKG Interpretation None      MDM   Final diagnoses:  Abdominal pain  Abdominal pain    Filed Vitals:   04/01/15 2300  BP: 177/97  Pulse: 48  Temp:   Resp:    Afebrile, NAD, non-toxic appearing, AAOx4 appropriate for age.  Patient is nontoxic, nonseptic appearing, in no apparent distress.  Patient's pain and other symptoms adequately managed in emergency department.  Fluid bolus given.  Labs, imaging and vitals reviewed.  Patient does not meet the SIRS or Sepsis criteria.  On repeat exam patient does not have a surgical abdomin and there are no peritoneal signs.  No indication of appendicitis, bowel obstruction, bowel perforation, cholecystitis, diverticulitis, PID or ectopic pregnancy.  Patient discharged home with symptomatic treatment and given strict instructions for follow-up with their primary care physician.  I have also discussed reasons to return immediately to the ER.  Patient expresses understanding and agrees with plan. Patient is stable at time of discharge     Baron Sane, PA-C 04/02/15 [place], MD 04/06/15 [phone]

## 2015-04-01 NOTE — ED Provider Notes (Addendum)
CSN: [id number]     Arrival date & time 04/01/15  1721 History   First MD Initiated Contact with Patient 04/01/15 1855     Chief Complaint  Patient presents with  . Abdominal Pain   (Consider location/radiation/quality/duration/timing/severity/associated sxs/prior Treatment) Patient is a 49 y.o. female presenting with abdominal pain. The history is provided by the patient.  Abdominal Pain Pain location:  RLQ Pain quality: pressure and sharp   Pain radiates to:  Does not radiate Pain severity:  Moderate Onset quality:  Sudden Duration:  1 day Timing:  Constant Progression:  Worsening Chronicity:  New Relieved by:  None tried Worsened by:  Nothing tried Associated symptoms: nausea   Associated symptoms: no constipation, no diarrhea, no fever and no vomiting   Risk factors comment:  S/p cholecyst, s/p tubal lig., h/o pancreatitis.   Past Medical History  Diagnosis Date  . Hypertension   . CAD (coronary artery disease) 2009    Dr Olegario Shearer -[facility], Heart Cath  . Hyperlipidemia   . Statin intolerance   . Acute pancreatitis 2011  . GERD (gastroesophageal reflux disease)   . Migraines   . Thyroid disease     hypothyroid, thyroid nodule  . Cancer     thyroid treated with radiation  . STD (sexually transmitted disease)     CHL 20 yrs ago  . Anemia   . PVC (premature ventricular contraction)    Past Surgical History  Procedure Laterality Date  . Cholecystectomy  2008  . Cardiac catheterization      unable to do cardiac cath but does stress echo 11/22/11  . Tubal ligation  1995    BTL  . Heart ablation  2006,2010    times 2, follow-up for v-tack   Family History  Problem Relation Age of Onset  . Hypertension Father   . Prostate cancer Father   . Heart attack Father   . Colon cancer Father   . Heart attack Cousin   . Diabetes Maternal Grandmother   . Hypertension Mother   . Diabetes Mother   . Cancer Sister     ovarian  . Diabetes Brother    History    Substance Use Topics  . Smoking status: Never Smoker   . Smokeless tobacco: Never Used  . Alcohol Use: No   OB History    Gravida Para Term Preterm AB TAB SAB Ectopic Multiple Living   4 4 4       4      Review of Systems  Constitutional: Negative for fever.  Gastrointestinal: Positive for nausea and abdominal pain. Negative for vomiting, diarrhea, constipation and blood in stool.  Genitourinary: Negative.   Musculoskeletal: Negative.     Allergies  Atorvastatin and Lisinopril  Home Medications   Prior to Admission medications   Medication Sig Start Date End Date Taking? Authorizing Provider  atenolol (TENORMIN) 25 MG tablet Take 25 mg by mouth daily.    Historical Provider, MD  Cholecalciferol (VITAMIN D PO) Take 1,000 Int'l Units by mouth.    Historical Provider, MD  fluticasone (FLONASE) 50 MCG/ACT nasal spray Place 2 sprays into both nostrils daily. Patient not taking: Reported on 03/27/2015 01/09/14   Lucretia Kern, DO  hydrochlorothiazide (HYDRODIURIL) 25 MG tablet Take 25 mg by mouth daily.    Historical Provider, MD  lisinopril (PRINIVIL,ZESTRIL) 20 MG tablet Take 1 tablet (20 mg total) by mouth 2 (two) times daily. 03/27/15   Aleksei Plotnikov V, MD  predniSONE (DELTASONE) 10 MG tablet Prednisone  10 mg: take 4 tabs a day x 3 days; then 3 tabs a day x 4 days; then 2 tabs a day x 4 days, then 1 tab a day x 6 days, then stop. Take pc. 03/27/15   Cassandria Anger, MD  traMADol (ULTRAM) 50 MG tablet Take 1-2 tablets (50-100 mg total) by mouth 2 (two) times daily as needed for severe pain. 03/27/15   Aleksei Plotnikov V, MD   BP 166/94 mmHg  Pulse 66  Temp(Src) 97.9 F (36.6 C) (Oral)  Resp 12  SpO2 100%  LMP 03/21/2015 Physical Exam  Constitutional: She is oriented to person, place, and time. She appears well-developed and well-nourished. She appears distressed.  Cardiovascular: Normal heart sounds.   Pulmonary/Chest: Breath sounds normal.  Abdominal: Normal appearance and  normal aorta. She exhibits no distension and no mass. There is tenderness in the right lower quadrant. There is tenderness at McBurney's point. There is no rigidity, no rebound and no guarding.    Neurological: She is alert and oriented to person, place, and time.  Skin: Skin is warm and dry.  Nursing note and vitals reviewed.   ED Course  Procedures (including critical care time) Labs Review Labs Reviewed - No data to display  Imaging Review No results found.   MDM   1. Abdominal pain, acute, right lower quadrant    Sent to r/o acute appy.    Billy Fischer, MD 04/01/15 9201  Billy Fischer, MD 04/01/15 [phone]

## 2015-04-02 MED ORDER — ONDANSETRON 4 MG PO TBDP: 4.0000 mg | ORAL_TABLET | Freq: Three times a day (TID) | ORAL | Status: DC | PRN

## 2015-04-02 MED ORDER — TRAMADOL HCL 50 MG PO TABS: 50.0000 mg | ORAL_TABLET | Freq: Four times a day (QID) | ORAL | Status: DC | PRN

## 2015-04-02 NOTE — Discharge Instructions (Signed)
Please follow up with your primary care physician in 1-2 days. If you do not have one please call the [place] number listed above. Please follow up with the GI doctor to schedule a follow up appointment.  Please follow up with your gynecologist to schedule a follow up appointment. Please take pain medication and/or muscle relaxants as prescribed and as needed for pain. Please do not drive on narcotic pain medication or on muscle relaxants. Please read all discharge instructions and return precautions.    Abdominal Pain, Women Abdominal (stomach, pelvic, or belly) pain can be caused by many things. It is important to tell your doctor:  The location of the pain.  Does it come and go or is it present all the time?  Are there things that start the pain (eating certain foods, exercise)?  Are there other symptoms associated with the pain (fever, nausea, vomiting, diarrhea)? All of this is helpful to know when trying to find the cause of the pain. CAUSES   Stomach: virus or bacteria infection, or ulcer.  Intestine: appendicitis (inflamed appendix), regional ileitis (Crohn's disease), ulcerative colitis (inflamed colon), irritable bowel syndrome, diverticulitis (inflamed diverticulum of the colon), or cancer of the stomach or intestine.  Gallbladder disease or stones in the gallbladder.  Kidney disease, kidney stones, or infection.  Pancreas infection or cancer.  Fibromyalgia (pain disorder).  Diseases of the female organs:  Uterus: fibroid (non-cancerous) tumors or infection.  Fallopian tubes: infection or tubal pregnancy.  Ovary: cysts or tumors.  Pelvic adhesions (scar tissue).  Endometriosis (uterus lining tissue growing in the pelvis and on the pelvic organs).  Pelvic congestion syndrome (female organs filling up with blood just before the menstrual period).  Pain with the menstrual period.  Pain with ovulation (producing an egg).  Pain with an IUD  (intrauterine device, birth control) in the uterus.  Cancer of the female organs.  Functional pain (pain not caused by a disease, may improve without treatment).  Psychological pain.  Depression. DIAGNOSIS  Your doctor will decide the seriousness of your pain by doing an examination.  Blood tests.  X-rays.  Ultrasound.  CT scan (computed tomography, special type of X-ray).  MRI (magnetic resonance imaging).  Cultures, for infection.  Barium enema (dye inserted in the large intestine, to better view it with X-rays).  Colonoscopy (looking in intestine with a lighted tube).  Laparoscopy (minor surgery, looking in abdomen with a lighted tube).  Major abdominal exploratory surgery (looking in abdomen with a large incision). TREATMENT  The treatment will depend on the cause of the pain.   Many cases can be observed and treated at home.  Over-the-counter medicines recommended by your caregiver.  Prescription medicine.  Antibiotics, for infection.  Birth control pills, for painful periods or for ovulation pain.  Hormone treatment, for endometriosis.  Nerve blocking injections.  Physical therapy.  Antidepressants.  Counseling with a psychologist or psychiatrist.  Minor or major surgery. HOME CARE INSTRUCTIONS   Do not take laxatives, unless directed by your caregiver.  Take over-the-counter pain medicine only if ordered by your caregiver. Do not take aspirin because it can cause an upset stomach or bleeding.  Try a clear liquid diet (broth or water) as ordered by your caregiver. Slowly move to a bland diet, as tolerated, if the pain is related to the stomach or intestine.  Have a thermometer and take your temperature several times a day, and record it.  Bed rest and sleep, if it helps the  pain.  Avoid sexual intercourse, if it causes pain.  Avoid stressful situations.  Keep your follow-up appointments and tests, as your caregiver orders.  If the pain  does not go away with medicine or surgery, you may try:  Acupuncture.  Relaxation exercises (yoga, meditation).  Group therapy.  Counseling. SEEK MEDICAL CARE IF:   You notice certain foods cause stomach pain.  Your home care treatment is not helping your pain.  You need stronger pain medicine.  You want your IUD removed.  You feel faint or lightheaded.  You develop nausea and vomiting.  You develop a rash.  You are having side effects or an allergy to your medicine. SEEK IMMEDIATE MEDICAL CARE IF:   Your pain does not go away or gets worse.  You have a fever.  Your pain is felt only in portions of the abdomen. The right side could possibly be appendicitis. The left lower portion of the abdomen could be colitis or diverticulitis.  You are passing blood in your stools (bright red or black tarry stools, with or without vomiting).  You have blood in your urine.  You develop chills, with or without a fever.  You pass out. MAKE SURE YOU:   Understand these instructions.  Will watch your condition.  Will get help right away if you are not doing well or get worse. Document Released: 07/10/2007 Document Revised: 01/27/2014 Document Reviewed: 07/30/2009 [facility] Patient Information 2015 [place], [place]. This information is not intended to replace advice given to you by your health care provider. Make sure you discuss any questions you have with your health care provider.

## 2015-04-03 ENCOUNTER — Telehealth: Payer: Self-pay | Admitting: Internal Medicine

## 2015-04-03 ENCOUNTER — Telehealth: Payer: Self-pay | Admitting: Certified Nurse Midwife

## 2015-04-03 NOTE — Telephone Encounter (Signed)
Pt was seen at ED. She states they advised her to see GI, Dr. Fuller Plan. GI can not see her until end of August. She still c/u RLQ abd pain. Please see 04/01/15 CT abd/pelvis. She wants to know what to do between now and her GI appt in August OR if we can have GI appt moved up. Please advise.

## 2015-04-03 NOTE — Telephone Encounter (Signed)
Pt informed .OV scheduled 04/06/15 @ 10:30.

## 2015-04-03 NOTE — Telephone Encounter (Signed)
Patient calling requesting to speak with the nurse. She said, "I was seen in the ER at [facility] on 04/01/15 for right side pain. They took some scans that showed I have a thickening of the uterus and said I need to see a gynecologist."

## 2015-04-03 NOTE — Telephone Encounter (Signed)
Please call patient regarding her recent er visit. Best number is [phone]

## 2015-04-03 NOTE — Telephone Encounter (Signed)
Spoke with patient. Advised of message as seen below from [place]. Patient is agreeable and verbalizes understanding. Has PCP reviewing scan as well and will schedule an appointment as needed. Aex moved to 04/08/2015 at [place] [facility] with Dr.Silva. Patient is agreeable and verbalizes understanding.  Routing to provider for final review. Patient agreeable to disposition. Will close encounter.

## 2015-04-03 NOTE — Telephone Encounter (Signed)
I reviewed the CT report which showed bladder wall thickening and an enlarged uterus due to probable fibroids.  No specific details about the fibroids.   It has been over a year since her last well women visit. I do recommend that she return to our care.   We can further assess the fibroids at that time.   Bladder wall thickening is better assessed through her PCP or urology.

## 2015-04-03 NOTE — Telephone Encounter (Signed)
OV next week Thx

## 2015-04-03 NOTE — Telephone Encounter (Signed)
Spoke with patient. States she was seen in the ED for right sided pain on 04/01/2015. Was advised to follow up with GI and GYN based on CT results. "They told me the lining of my uterus was thick and I really needed to see a GYN." Patient is currently at work and unable to give many details. Is still having right sided discomfort. Denies any vaginal bleeding. Was given Tramadol for pain which has been helping. "Do I really need to be seen for follow up?" Advised follow up is recommended after ED visit to ensure nothing further is causing her discomfort. Advised I will speak with MD in office to have results reviewed and further recommendations made. Advised I will return her call with further recommendations. Patient is agreeable.  Dr.Silva, please review and advise. ED notes available in EPIC.

## 2015-04-06 ENCOUNTER — Encounter: Payer: Self-pay | Admitting: Internal Medicine

## 2015-04-06 ENCOUNTER — Ambulatory Visit (INDEPENDENT_AMBULATORY_CARE_PROVIDER_SITE_OTHER): Payer: Commercial Managed Care - PPO | Admitting: Internal Medicine

## 2015-04-06 VITALS — BP 144/90 | HR 69 | Temp 98.6°F | Wt 181.0 lb

## 2015-04-06 DIAGNOSIS — R1310 Dysphagia, unspecified: Secondary | ICD-10-CM

## 2015-04-06 DIAGNOSIS — R1031 Right lower quadrant pain: Secondary | ICD-10-CM

## 2015-04-06 NOTE — Progress Notes (Signed)
Subjective:  Patient ID: [name], female    DOB: [birth year]  Age: 49 y.o. MRN: [id number]  CC: Abdominal Pain   HPI [name] presents for RLQ abd pain x 2 weeks. It is not better - 7/10; worse w/eating, waking up at night. No n/v. Pt had a CT, UA, labs.   Outpatient Prescriptions Prior to Visit  Medication Sig Dispense Refill  . atenolol (TENORMIN) 25 MG tablet Take 25 mg by mouth daily.    Marland Kitchen lisinopril (PRINIVIL,ZESTRIL) 20 MG tablet Take 1 tablet (20 mg total) by mouth 2 (two) times daily. (Patient taking differently: Take 20 mg by mouth daily. ) 60 tablet 11  . Multiple Vitamin (MULTIVITAMIN WITH MINERALS) TABS tablet Take 1 tablet by mouth daily.    . traMADol (ULTRAM) 50 MG tablet Take 1-2 tablets (50-100 mg total) by mouth 2 (two) times daily as needed for severe pain. 60 tablet 1  . Cholecalciferol (VITAMIN D PO) Take 1,000 Int'l Units by mouth.    . fluticasone (FLONASE) 50 MCG/ACT nasal spray Place 2 sprays into both nostrils daily. (Patient not taking: Reported on 04/06/2015) 16 g 1  . hydrochlorothiazide (HYDRODIURIL) 25 MG tablet Take 25 mg by mouth daily.    . ondansetron (ZOFRAN ODT) 4 MG disintegrating tablet Take 1 tablet (4 mg total) by mouth every 8 (eight) hours as needed for nausea or vomiting. (Patient not taking: Reported on 04/06/2015) 20 tablet 0  . traMADol (ULTRAM) 50 MG tablet Take 1 tablet (50 mg total) by mouth every 6 (six) hours as needed. (Patient not taking: Reported on 04/06/2015) 15 tablet 0  . predniSONE (DELTASONE) 10 MG tablet Prednisone 10 mg: take 4 tabs a day x 3 days; then 3 tabs a day x 4 days; then 2 tabs a day x 4 days, then 1 tab a day x 6 days, then stop. Take pc. (Patient not taking: Reported on 04/06/2015) 38 tablet 1   Facility-Administered Medications Prior to Visit  Medication Dose Route Frequency Provider Last Rate Last Dose  . methylPREDNISolone acetate (DEPO-MEDROL) 20 MG/ML injection 20 mg  20 mg Intramuscular Once Aleksei  Plotnikov V, MD        ROS Review of Systems  Constitutional: Negative for chills, activity change, appetite change, fatigue and unexpected weight change.  HENT: Negative for congestion, mouth sores and sinus pressure.   Eyes: Negative for visual disturbance.  Respiratory: Negative for cough and chest tightness.   Gastrointestinal: Positive for abdominal pain. Negative for nausea, vomiting, diarrhea, blood in stool and rectal pain.  Genitourinary: Negative for urgency, frequency, difficulty urinating and vaginal pain.  Musculoskeletal: Negative for back pain and gait problem.  Skin: Negative for pallor and rash.  Neurological: Negative for dizziness, tremors, weakness, numbness and headaches.  Psychiatric/Behavioral: Negative for confusion and sleep disturbance. The patient is not nervous/anxious.     Objective:  BP 144/90 mmHg  Pulse 69  Temp(Src) 98.6 F (37 C) (Oral)  Wt 181 lb (82.101 kg)  SpO2 96%  LMP 03/21/2015  BP Readings from Last 3 Encounters:  04/06/15 144/90  04/01/15 177/97  04/01/15 166/94    Wt Readings from Last 3 Encounters:  04/06/15 181 lb (82.101 kg)  04/01/15 179 lb (81.194 kg)  03/27/15 180 lb (81.647 kg)    Physical Exam  Constitutional: She appears well-developed. No distress.  HENT:  Head: Normocephalic.  Right Ear: External ear normal.  Left Ear: External ear normal.  Nose: Nose normal.  Mouth/Throat: Oropharynx  is clear and moist.  Eyes: Conjunctivae are normal. Pupils are equal, round, and reactive to light. Right eye exhibits no discharge. Left eye exhibits no discharge.  Neck: Normal range of motion. Neck supple. No JVD present. No tracheal deviation present. No thyromegaly present.  Cardiovascular: Normal rate, regular rhythm and normal heart sounds.   Pulmonary/Chest: No stridor. No respiratory distress. She has no wheezes.  Abdominal: Soft. Bowel sounds are normal. She exhibits no distension and no mass. There is tenderness. There is  no rebound and no guarding.  Musculoskeletal: She exhibits no edema or tenderness.  Lymphadenopathy:    She has no cervical adenopathy.  Neurological: She displays normal reflexes. No cranial nerve deficit. She exhibits normal muscle tone. Coordination normal.  Skin: No rash noted. No erythema.  Psychiatric: She has a normal mood and affect. Her behavior is normal. Judgment and thought content normal.  RLQ is tender  Lab Results  Component Value Date   WBC 8.2 04/01/2015   HGB 12.9 04/01/2015   HCT 38.5 04/01/2015   PLT 336 04/01/2015   GLUCOSE 95 04/01/2015   CHOL 197 03/06/2013   TRIG 78.0 03/06/2013   HDL 59.10 03/06/2013   LDLCALC 122* 03/06/2013   ALT 14 04/01/2015   AST 19 04/01/2015   NA 139 04/01/2015   K 4.3 04/01/2015   CL 104 04/01/2015   CREATININE 0.77 04/01/2015   BUN 9 04/01/2015   CO2 27 04/01/2015   TSH 1.30 03/06/2013    Ct Abdomen Pelvis W Contrast  04/02/2015   CLINICAL DATA:  49 year old female with right lower quadrant abdominal pain.  EXAM: CT ABDOMEN AND PELVIS WITH CONTRAST  TECHNIQUE: Multidetector CT imaging of the abdomen and pelvis was performed using the standard protocol following bolus administration of intravenous contrast.  CONTRAST:  186mL OMNIPAQUE IOHEXOL 300 MG/ML  SOLN  COMPARISON:  MRI dated 09/24/2012 and CT dated 07/11/2011  FINDINGS: Evaluation is limited due to respiratory motion artifact.  The visualized lung bases are clear.  No intra-abdominal free air or free fluid.  Cholecystectomy. Minimal biliary ductal prominence, likely post cholecystectomy. The liver, pancreas, spleen, adrenal glands, kidneys, and visualized ureters appear unremarkable. There is apparent diffuse thickening of the bladder wall concerning for cystitis. Correlation with urinalysis recommended. The uterus demonstrates heterogeneous enhancement with multiple fibroids. Ultrasound may provide better evaluation of the pelvic structures. The 2 cm left ovarian corpus luteum  is noted.  The stomach is distended with oral contrast. There is apparent focal thickening of the gastric pylorus which is similar to the CT dated 07/01/2011. This may be related to underdistention or focal fold thickening related to chronic inflammation. Underlying mass is not excluded. Endoscopy may provide better evaluation. There is distention of the stomach with oral contrast as seen on the prior study. This may be related to timing of the imaging, however a partial gastric outlet obstruction or gastroparesis is not excluded. There is no evidence of small-bowel obstruction. Moderate amount of stool noted throughout the colon. The appendix appears unremarkable.  The visualized abdominal aorta and IVC appear unremarkable. The SMV and main portal vein as well as the splenic vein are patent. No lymphadenopathy.  Diastases of anterior abdominal wall musculature at the level of the umbilicus. The visualized osseous structures are unremarkable.  IMPRESSION: Focal thickening of the gastric pylorus similar to the prior CT may be related to underdistention versus chronic inflammation. Underlying mass with possible partial gastric outlet obstruction is not excluded. Correlation with endoscopy recommended. No evidence  of small bowel obstruction. Normal appendix.  Diffuse bladder wall thickening. Correlation with urinalysis recommended to exclude UTI.  Myomatous uterus.   Electronically Signed   By: Anner Crete M.D.   On: 04/02/2015 00:21    Assessment & Plan:   There are no diagnoses linked to this encounter. I have discontinued [name]'s predniSONE. I am also having her maintain her atenolol, hydrochlorothiazide, Cholecalciferol (VITAMIN D PO), fluticasone, traMADol, lisinopril, multivitamin with minerals, traMADol, and ondansetron. We will continue to administer methylPREDNISolone acetate.  No orders of the defined types were placed in this encounter.     Follow-up: No Follow-up on file.  Walker Kehr, MD

## 2015-04-06 NOTE — Assessment & Plan Note (Signed)
Recurrent. Abn appearance of stomach mucosa on CT 7/16 Sch OV w/Dr Fuller Plan

## 2015-04-06 NOTE — Assessment & Plan Note (Addendum)
7/16 ?fibroids vs adhesions - GYN appt in 2 days. UA was nl Tramadol prn

## 2015-04-06 NOTE — Progress Notes (Signed)
Pre visit review using our clinic review tool, if applicable. No additional management support is needed unless otherwise documented below in the visit note. 

## 2015-04-08 ENCOUNTER — Encounter: Payer: Self-pay | Admitting: Obstetrics and Gynecology

## 2015-04-08 ENCOUNTER — Ambulatory Visit (INDEPENDENT_AMBULATORY_CARE_PROVIDER_SITE_OTHER): Payer: Commercial Managed Care - PPO | Admitting: Obstetrics and Gynecology

## 2015-04-08 VITALS — BP 122/80 | HR 72 | Resp 16 | Ht 62.0 in | Wt 177.0 lb

## 2015-04-08 DIAGNOSIS — R350 Frequency of micturition: Secondary | ICD-10-CM

## 2015-04-08 DIAGNOSIS — R1031 Right lower quadrant pain: Secondary | ICD-10-CM | POA: Diagnosis not present

## 2015-04-08 DIAGNOSIS — N921 Excessive and frequent menstruation with irregular cycle: Secondary | ICD-10-CM | POA: Diagnosis not present

## 2015-04-08 DIAGNOSIS — D259 Leiomyoma of uterus, unspecified: Secondary | ICD-10-CM

## 2015-04-08 LAB — POCT URINALYSIS DIPSTICK
Bilirubin, UA: NEGATIVE
GLUCOSE UA: NEGATIVE
Ketones, UA: NEGATIVE
Leukocytes, UA: NEGATIVE
NITRITE UA: NEGATIVE
PROTEIN UA: NEGATIVE
RBC UA: NEGATIVE
Urobilinogen, UA: NEGATIVE
pH, UA: 5

## 2015-04-08 NOTE — Progress Notes (Deleted)
49 y.o. [id number] Divorced [place] American female here for annual exam.    PCP:   Plotnikov, Aleksei   Patient's last menstrual period was 03/21/2015.          Sexually active: No.  The current method of family planning is abstinence.    Exercising: No.  The patient does not participate in regular exercise at present. Smoker:  no  Health Maintenance: Pap:  01/2012 Neg. HR HPV:Neg History of abnormal Pap:  no MMG:  05/16/14 BIRADS1:Neg Colonoscopy:  Never BMD:   Never  Result   TDaP:  2013 Screening Labs:  Hb today: ?, Urine today:    reports that she has never smoked. She has never used smokeless tobacco. She reports that she does not drink alcohol or use illicit drugs.  Past Medical History  Diagnosis Date  . Hypertension   . CAD (coronary artery disease) 2009    Dr Olegario Shearer -[facility], Heart Cath  . Hyperlipidemia   . Statin intolerance   . Acute pancreatitis 2011  . GERD (gastroesophageal reflux disease)   . Migraines   . Thyroid disease     hypothyroid, thyroid nodule  . Cancer     thyroid treated with radiation  . STD (sexually transmitted disease)     CHL 20 yrs ago  . Anemia   . PVC (premature ventricular contraction)     Past Surgical History  Procedure Laterality Date  . Cholecystectomy  2008  . Cardiac catheterization      unable to do cardiac cath but does stress echo 11/22/11  . Tubal ligation  1995    BTL  . Heart ablation  2006,2010    times 2, follow-up for v-tack    Current Outpatient Prescriptions  Medication Sig Dispense Refill  . atenolol (TENORMIN) 25 MG tablet Take 25 mg by mouth daily.    Marland Kitchen lisinopril (PRINIVIL,ZESTRIL) 20 MG tablet Take 1 tablet (20 mg total) by mouth 2 (two) times daily. (Patient taking differently: Take 20 mg by mouth daily. ) 60 tablet 11  . Multiple Vitamin (MULTIVITAMIN WITH MINERALS) TABS tablet Take 1 tablet by mouth daily.    . traMADol (ULTRAM) 50 MG tablet Take 1-2 tablets (50-100 mg total) by mouth 2 (two) times  daily as needed for severe pain. 60 tablet 1  . Cholecalciferol (VITAMIN D PO) Take 1,000 Int'l Units by mouth.    . fluticasone (FLONASE) 50 MCG/ACT nasal spray Place 2 sprays into both nostrils daily. (Patient not taking: Reported on 04/06/2015) 16 g 1  . hydrochlorothiazide (HYDRODIURIL) 25 MG tablet Take 25 mg by mouth daily.    . ondansetron (ZOFRAN ODT) 4 MG disintegrating tablet Take 1 tablet (4 mg total) by mouth every 8 (eight) hours as needed for nausea or vomiting. (Patient not taking: Reported on 04/06/2015) 20 tablet 0   Current Facility-Administered Medications  Medication Dose Route Frequency Provider Last Rate Last Dose  . methylPREDNISolone acetate (DEPO-MEDROL) 20 MG/ML injection 20 mg  20 mg Intramuscular Once Lew Dawes V, MD        Family History  Problem Relation Age of Onset  . Hypertension Father   . Prostate cancer Father   . Heart attack Father   . Colon cancer Father   . Heart attack Cousin   . Diabetes Maternal Grandmother   . Hypertension Mother   . Diabetes Mother   . Cancer Sister     ovarian  . Diabetes Brother     ROS:  Pertinent items are noted  in HPI.  Otherwise, a comprehensive ROS was negative.  Exam:   BP 122/80 mmHg  Pulse 72  Resp 16  Ht 5\' 2"  (1.575 m)  Wt 177 lb (80.287 kg)  BMI 32.37 kg/m2  LMP 03/21/2015    General appearance: alert, cooperative and appears stated age Head: Normocephalic, without obvious abnormality, atraumatic Neck: no adenopathy, supple, symmetrical, trachea midline and thyroid {EXAM; THYROID:18604} Lungs: clear to auscultation bilaterally Breasts: {Exam; breast:13139::"normal appearance, no masses or tenderness"} Heart: regular rate and rhythm Abdomen: soft, non-tender; bowel sounds normal; no masses,  no organomegaly Extremities: extremities normal, atraumatic, no cyanosis or edema Skin: Skin color, texture, turgor normal. No rashes or lesions Lymph nodes: Cervical, supraclavicular, and axillary nodes  normal. No abnormal inguinal nodes palpated Neurologic: Grossly normal  Pelvic: External genitalia:  no lesions              Urethra:  normal appearing urethra with no masses, tenderness or lesions              Bartholins and Skenes: normal                 Vagina: normal appearing vagina with normal color and discharge, no lesions              Cervix: {exam; cervix:14595}              Pap taken: {yes no:314532} Bimanual Exam:  Uterus:  {exam; uterus:12215}              Adnexa: {exam; adnexa:12223}              Rectovaginal: {yes no:314532}.  Confirms.              Anus:  normal sphincter tone, no lesions  Chaperone was present for exam.  Assessment:   Well woman visit with normal exam.   Plan: Yearly mammogram recommended after age 48.  Recommended self breast exam.  Pap and HR HPV as above. Discussed Calcium, Vitamin D, regular exercise program including cardiovascular and weight bearing exercise. Labs performed.  {yes [id number].   See orders. Refills given on medications.  {yes [id number].  See orders. Follow up annually and prn.   Additional counseling given __________________.  After visit summary provided.

## 2015-04-08 NOTE — Progress Notes (Signed)
GYNECOLOGY  VISIT   HPI: 49 y.o.   Divorced  [place] American  female   [phone] with Patient's last menstrual period was 03/21/2015.   here for   Follow up Pelvic pain.  RLQ pain woke patient up two weeks ago. Went to the ER and had a CT scan to rule out appendicitis. Will see GI in follow up and possible urology consult as well.  Pain is persistent and is RLQ, suprapubic, and right flank.  Taking Tramadol and this does not help pain.   Menses twice a month for 3 months.   Bleeding lasts 6 - 7 days at a time.  Using pad and tampon with pad change every 1.5 hours.  Some dysmenorrhea.  No hot flashes.  Hgb 12.9 on 04/01/15.  Urinary frequency.   Sister with ovarian cancer.    CT scan on 04/02/15 showed bladder wall thickening, fibroids, and possible gastric outlet problem. See report below:  CLINICAL DATA: 49 year old female with right lower quadrant abdominal pain.  EXAM: CT ABDOMEN AND PELVIS WITH CONTRAST  TECHNIQUE: Multidetector CT imaging of the abdomen and pelvis was performed using the standard protocol following bolus administration of intravenous contrast.  CONTRAST: 139mL OMNIPAQUE IOHEXOL 300 MG/ML SOLN  COMPARISON: MRI dated 09/24/2012 and CT dated 07/11/2011  FINDINGS: Evaluation is limited due to respiratory motion artifact.  The visualized lung bases are clear.  No intra-abdominal free air or free fluid.  Cholecystectomy. Minimal biliary ductal prominence, likely post cholecystectomy. The liver, pancreas, spleen, adrenal glands, kidneys, and visualized ureters appear unremarkable. There is apparent diffuse thickening of the bladder wall concerning for cystitis. Correlation with urinalysis recommended. The uterus demonstrates heterogeneous enhancement with multiple fibroids. Ultrasound may provide better evaluation of the pelvic structures. The 2 cm left ovarian corpus luteum is noted.  The stomach is distended with oral contrast.  There is apparent focal thickening of the gastric pylorus which is similar to the CT dated 07/01/2011. This may be related to underdistention or focal fold thickening related to chronic inflammation. Underlying mass is not excluded. Endoscopy may provide better evaluation. There is distention of the stomach with oral contrast as seen on the prior study. This may be related to timing of the imaging, however a partial gastric outlet obstruction or gastroparesis is not excluded. There is no evidence of small-bowel obstruction. Moderate amount of stool noted throughout the colon. The appendix appears unremarkable.  The visualized abdominal aorta and IVC appear unremarkable. The SMV and main portal vein as well as the splenic vein are patent. No lymphadenopathy.  Diastases of anterior abdominal wall musculature at the level of the umbilicus. The visualized osseous structures are unremarkable.  IMPRESSION: Focal thickening of the gastric pylorus similar to the prior CT may be related to underdistention versus chronic inflammation. Underlying mass with possible partial gastric outlet obstruction is not excluded. Correlation with endoscopy recommended. No evidence of small bowel obstruction. Normal appendix.  Diffuse bladder wall thickening. Correlation with urinalysis recommended to exclude UTI.  Myomatous uterus.   Electronically Signed  By: Anner Crete M.D.  On: 04/02/2015 00:21  Urine dip toady - negative.  GYNECOLOGIC HISTORY: Patient's last menstrual period was 03/21/2015. Contraception: Abstinence, BTL. Not sexually active since 2010. Menopausal hormone therapy: None Last mammogram: 04/2014 BIRADS1:Neg Last pap smear: 01/2012 neg. HR HPV:neg        OB History    Gravida Para Term Preterm AB TAB SAB Ectopic Multiple Living   4 4 4        4  Patient Active Problem List   Diagnosis Date Noted  . Occipital neuralgia of left side 11/14/2013  .  Aspiration into respiratory tract 04/03/2012  . Cough due to bronchospasm 04/03/2012  . Chest pain, atypical 04/03/2012  . ACUTE PANCREATITIS 06/16/2010  . HYPERGLYCEMIA 06/16/2010  . BACK PAIN, LUMBAR 05/27/2009  . THYROID NODULE, RIGHT 05/04/2009  . HYPERTHYROIDISM 05/04/2009  . HYPERLIPIDEMIA 12/31/2008  . Coronary atherosclerosis 12/31/2008  . LEG PAIN, BILATERAL 12/31/2008  . PARESTHESIA 12/31/2008  . EDEMA 12/31/2008  . RLQ abdominal pain 10/02/2007  . GERD 09/17/2007  . DYSPNEA 08/28/2007  . Dysphagia 08/28/2007  . Essential hypertension 04/24/2007    Past Medical History  Diagnosis Date  . Hypertension   . CAD (coronary artery disease) 2009    Dr Olegario Shearer -[facility], Heart Cath  . Hyperlipidemia   . Statin intolerance   . Acute pancreatitis 2011  . GERD (gastroesophageal reflux disease)   . Migraines   . Thyroid disease     hypothyroid, thyroid nodule  . Cancer     thyroid treated with radiation  . STD (sexually transmitted disease)     CHL 20 yrs ago  . Anemia   . PVC (premature ventricular contraction)     Past Surgical History  Procedure Laterality Date  . Cholecystectomy  2008  . Cardiac catheterization      unable to do cardiac cath but does stress echo 11/22/11  . Tubal ligation  1995    BTL  . Heart ablation  2006,2010    times 2, follow-up for v-tack    Current Outpatient Prescriptions  Medication Sig Dispense Refill  . atenolol (TENORMIN) 25 MG tablet Take 25 mg by mouth daily.    Marland Kitchen lisinopril (PRINIVIL,ZESTRIL) 20 MG tablet Take 1 tablet (20 mg total) by mouth 2 (two) times daily. (Patient taking differently: Take 20 mg by mouth daily. ) 60 tablet 11  . Multiple Vitamin (MULTIVITAMIN WITH MINERALS) TABS tablet Take 1 tablet by mouth daily.    . traMADol (ULTRAM) 50 MG tablet Take 1-2 tablets (50-100 mg total) by mouth 2 (two) times daily as needed for severe pain. 60 tablet 1  . Cholecalciferol (VITAMIN D PO) Take 1,000 Int'l Units by mouth.     . fluticasone (FLONASE) 50 MCG/ACT nasal spray Place 2 sprays into both nostrils daily. (Patient not taking: Reported on 04/06/2015) 16 g 1  . hydrochlorothiazide (HYDRODIURIL) 25 MG tablet Take 25 mg by mouth daily.    . ondansetron (ZOFRAN ODT) 4 MG disintegrating tablet Take 1 tablet (4 mg total) by mouth every 8 (eight) hours as needed for nausea or vomiting. (Patient not taking: Reported on 04/06/2015) 20 tablet 0   Current Facility-Administered Medications  Medication Dose Route Frequency Provider Last Rate Last Dose  . methylPREDNISolone acetate (DEPO-MEDROL) 20 MG/ML injection 20 mg  20 mg Intramuscular Once Aleksei Plotnikov V, MD         ALLERGIES: Atorvastatin and Lisinopril  Family History  Problem Relation Age of Onset  . Hypertension Father   . Prostate cancer Father   . Heart attack Father   . Colon cancer Father   . Heart attack Cousin   . Diabetes Maternal Grandmother   . Hypertension Mother   . Diabetes Mother   . Cancer Sister     ovarian  . Diabetes Brother     History   Social History  . Marital Status: Divorced    Spouse Name: N/A  . Number of Children: 4  .  Years of Education: N/A   Occupational History  . DISPATCHER [employer]  . Student     F/T   Social History Main Topics  . Smoking status: Never Smoker   . Smokeless tobacco: Never Used  . Alcohol Use: No  . Drug Use: No  . Sexual Activity: No     Comment: BTL   Other Topics Concern  . Not on file   Social History Narrative   FAMILY HISTORY   History of hypertension   History of prostate cancer 1st degree relative <50   D, S ovar. [place]      Regular exercise - YES      GYN Dr Posey Pronto - [facility]    ROS:  Pertinent items are noted in HPI.  PHYSICAL EXAMINATION:    BP 122/80 mmHg  Pulse 72  Resp 16  Ht 5\' 2"  (1.575 m)  Wt 177 lb (80.287 kg)  BMI 32.37 kg/m2  LMP 03/21/2015    General appearance: alert, cooperative and appears stated age Lungs: clear to  auscultation bilaterally Heart: regular rate and rhythm Abdomen: soft, non-tender; no masses,  no organomegaly Extremities: extremities normal, atraumatic, no cyanosis or edema Skin: Skin color, texture, turgor normal. No rashes or lesions Lymph nodes: Cervical, supraclavicular, and axillary nodes normal. No abnormal inguinal nodes palpated Neurologic: Grossly normal  Pelvic: External genitalia:  no lesions              Urethra:  normal appearing urethra with no masses, tenderness or lesions              Bartholins and Skenes: normal                 Vagina: normal appearing vagina with normal color and discharge, no lesions              Cervix: no lesions              Bimanual Exam:  Uterus:  6 - 7 week size and irregular.  Right fundal fibroids palpable.              Adnexa: normal adnexa and no mass, fullness, tenderness              Rectovaginal: Yes.  .  Confirms.              Anus:  normal sphincter tone, no lesions  Chaperone was present for exam.  ASSESSMENT  Menorrhagia with irregular menses.  Known fibroids.  RLQ pain.  Urinary frequency.  Urine negative. FH of ovarian cancer.  Status post BTL. Bladder wall thickening.  Hx of CAD, HTN, and migraine headaches.   PLAN  Counseled regarding evaluation of pain and heavy and irregular menses.   Return for pelvic ultrasound and possible endometrial biopsy.  Will need to avoid estrogens if desires medical treatment or pain, bleeding, and fibroids. An After Visit Summary was printed and given to the patient.  __15____ minutes face to face time of which over 50% was spent in counseling.

## 2015-04-08 NOTE — Progress Notes (Signed)
Per Dr Elza Rafter instruction, pelvic ultrasound with possible endometrial biopsy scheduled for 04-09-15 at 1000. Instructed to take Motrin 800 mg one hour prior with food. Patient agreeable to appointment.

## 2015-04-09 ENCOUNTER — Ambulatory Visit (INDEPENDENT_AMBULATORY_CARE_PROVIDER_SITE_OTHER): Payer: Commercial Managed Care - PPO | Admitting: Obstetrics and Gynecology

## 2015-04-09 ENCOUNTER — Ambulatory Visit (INDEPENDENT_AMBULATORY_CARE_PROVIDER_SITE_OTHER): Payer: Commercial Managed Care - PPO

## 2015-04-09 ENCOUNTER — Telehealth: Payer: Self-pay | Admitting: Internal Medicine

## 2015-04-09 ENCOUNTER — Encounter: Payer: Self-pay | Admitting: Obstetrics and Gynecology

## 2015-04-09 VITALS — BP 136/86 | Resp 18 | Ht 62.0 in | Wt 179.0 lb

## 2015-04-09 DIAGNOSIS — R9389 Abnormal findings on diagnostic imaging of other specified body structures: Secondary | ICD-10-CM

## 2015-04-09 DIAGNOSIS — M25551 Pain in right hip: Secondary | ICD-10-CM | POA: Diagnosis not present

## 2015-04-09 DIAGNOSIS — D251 Intramural leiomyoma of uterus: Secondary | ICD-10-CM | POA: Diagnosis not present

## 2015-04-09 DIAGNOSIS — N921 Excessive and frequent menstruation with irregular cycle: Secondary | ICD-10-CM | POA: Diagnosis not present

## 2015-04-09 DIAGNOSIS — R1031 Right lower quadrant pain: Secondary | ICD-10-CM | POA: Diagnosis not present

## 2015-04-09 DIAGNOSIS — D259 Leiomyoma of uterus, unspecified: Secondary | ICD-10-CM

## 2015-04-09 DIAGNOSIS — R938 Abnormal findings on diagnostic imaging of other specified body structures: Secondary | ICD-10-CM

## 2015-04-09 NOTE — Telephone Encounter (Signed)
Left mess for patient to call back. Need to know reason for referral.

## 2015-04-09 NOTE — Telephone Encounter (Signed)
Pt called in and is wanted to go head and get a referral to a urologist .  Can that referral be put in for this pt?

## 2015-04-09 NOTE — Progress Notes (Signed)
Subjective  49 y.o. [id number]  African American female here for pelvic ultrasound for RLQ pain, fibroids, FH of ovarian cancer.   Has migraine with some aura.  Will do steroid injection for this.   Hx of CAD.  No stent.  Hx HTN.   Objective  Pelvic ultrasound images and report reviewed with patient.  Uterus - 3 intramural fibroids - 0.63 - 3.17 cm. EMS - 11.63 mm. Ovaries - normal Free fluid - no         Procedure - endometrial biopsy Consent performed. Speculum place in vagina.  Sterile prep of cervix with  betadine Tenaculum to anterior cervical lip. Paracervical block  -  no Pipelle placed to    9      cm without difficulty twice. Tissue obtained and sent to pathology. Speculum removed.  No complications. Minimal EBL.  Assessment  Menorrhagia with irregular menses.  Fibroids.  Thickened endometrium. RLQ pain. Uncertain etiology.  I do not anticipate that the small fibroids are responsible for this.  Urinary frequency. thickened bladder wall. Urine negative. FH of ovarian cancer.  Status post BTL. Hx of CAD, HTN, and migraine headaches.   Plan  Discussion of fibroids.  Discussion of endometrial thickening.  Follow up EMB.  Instructions and precautions given.  Discussion of potential options for care pending the EMB result - Provera, Depo Provera, Micronor, Mirena, hysteroscopy with dilation and curettage (+/- ablation), hysterectomy. I favor a potential Mirena IUD.  This discussed in detail - risks and benefits. Will precert.  I recommend seeing urology.  Patient will follow up with her PCP to arrange this.   ___25____ minutes face to face time of which over 50% was spent in counseling.   After visit summary to patient.

## 2015-04-09 NOTE — Patient Instructions (Addendum)
Endometrial Biopsy, Care After Refer to this sheet in the next few weeks. These instructions provide you with information on caring for yourself after your procedure. Your health care provider may also give you more specific instructions. Your treatment has been planned according to current medical practices, but problems sometimes occur. Call your health care provider if you have any problems or questions after your procedure. WHAT TO EXPECT AFTER THE PROCEDURE After your procedure, it is typical to have the following:  You may have mild cramping and a small amount of vaginal bleeding for a few days after the procedure. This is normal. HOME CARE INSTRUCTIONS  Only take over-the-counter or prescription medicine as directed by your health care provider.  Do not douche, use tampons, or have sexual intercourse until your health care provider approves.  Follow your health care provider's instructions regarding any activity restrictions, such as strenuous exercise or heavy lifting. SEEK MEDICAL CARE IF:  You have heavy bleeding or bleeding longer than 2 days after the procedure.  You have bad smelling drainage from your vagina.  You have a fever and chills.  Youhave severe lower stomach (abdominal) pain. SEEK IMMEDIATE MEDICAL CARE IF:  You have severe cramps in your stomach or back.  You pass large blood clots.  Your bleeding increases.  You become weak or lightheaded, or you pass out. Document Released: 07/03/2013 Document Reviewed: 07/03/2013 [facility] Patient Information 2015 [place], [place]. This information is not intended to replace advice given to you by your health care provider. Make sure you discuss any questions you have with your health care provider.  Levonorgestrel intrauterine device (IUD) What is this medicine? LEVONORGESTREL IUD (LEE voe nor jes trel) is a contraceptive (birth control) device. The device is placed inside the uterus by a healthcare professional. It is  used to prevent pregnancy and can also be used to treat heavy bleeding that occurs during your period. Depending on the device, it can be used for 3 to 5 years. This medicine may be used for other purposes; ask your health care provider or pharmacist if you have questions. COMMON BRAND NAME(S): Verda Cumins What should I tell my health care provider before I take this medicine? They need to know if you have any of these conditions: -abnormal Pap smear -cancer of the breast, uterus, or cervix -diabetes -endometritis -genital or pelvic infection now or in the past -have more than one sexual partner or your partner has more than one partner -heart disease -history of an ectopic or tubal pregnancy -immune system problems -IUD in place -liver disease or tumor -problems with blood clots or take blood-thinners -use intravenous drugs -uterus of unusual shape -vaginal bleeding that has not been explained -an unusual or allergic reaction to levonorgestrel, other hormones, silicone, or polyethylene, medicines, foods, dyes, or preservatives -pregnant or trying to get pregnant -breast-feeding How should I use this medicine? This device is placed inside the uterus by a health care professional. Talk to your pediatrician regarding the use of this medicine in children. Special care may be needed. Overdosage: If you think you have taken too much of this medicine contact a poison control center or emergency room at once. NOTE: This medicine is only for you. Do not share this medicine with others. What if I miss a dose? This does not apply. What may interact with this medicine? Do not take this medicine with any of the following medications: -amprenavir -bosentan -fosamprenavir This medicine may also interact with the following medications: -aprepitant -barbiturate  medicines for inducing sleep or treating seizures -bexarotene -griseofulvin -medicines to treat seizures like  carbamazepine, ethotoin, felbamate, oxcarbazepine, phenytoin, topiramate -modafinil -pioglitazone -rifabutin -rifampin -rifapentine -some medicines to treat HIV infection like atazanavir, indinavir, lopinavir, nelfinavir, tipranavir, ritonavir -St. John's wort -warfarin This list may not describe all possible interactions. Give your health care provider a list of all the medicines, herbs, non-prescription drugs, or dietary supplements you use. Also tell them if you smoke, drink alcohol, or use illegal drugs. Some items may interact with your medicine. What should I watch for while using this medicine? Visit your doctor or health care professional for regular check ups. See your doctor if you or your partner has sexual contact with others, becomes HIV positive, or gets a sexual transmitted disease. This product does not protect you against HIV infection (AIDS) or other sexually transmitted diseases. You can check the placement of the IUD yourself by reaching up to the top of your vagina with clean fingers to feel the threads. Do not pull on the threads. It is a good habit to check placement after each menstrual period. Call your doctor right away if you feel more of the IUD than just the threads or if you cannot feel the threads at all. The IUD may come out by itself. You may become pregnant if the device comes out. If you notice that the IUD has come out use a backup birth control method like condoms and call your health care provider. Using tampons will not change the position of the IUD and are okay to use during your period. What side effects may I notice from receiving this medicine? Side effects that you should report to your doctor or health care professional as soon as possible: -allergic reactions like skin rash, itching or hives, swelling of the face, lips, or tongue -fever, flu-like symptoms -genital sores -high blood pressure -no menstrual period for 6 weeks during use -pain,  swelling, warmth in the leg -pelvic pain or tenderness -severe or sudden headache -signs of pregnancy -stomach cramping -sudden shortness of breath -trouble with balance, talking, or walking -unusual vaginal bleeding, discharge -yellowing of the eyes or skin Side effects that usually do not require medical attention (report to your doctor or health care professional if they continue or are bothersome): -acne -breast pain -change in sex drive or performance -changes in weight -cramping, dizziness, or faintness while the device is being inserted -headache -irregular menstrual bleeding within first 3 to 6 months of use -nausea This list may not describe all possible side effects. Call your doctor for medical advice about side effects. You may report side effects to FDA at 1-800-FDA-1088. Where should I keep my medicine? This does not apply. NOTE: This sheet is a summary. It may not cover all possible information. If you have questions about this medicine, talk to your doctor, pharmacist, or health care provider.  2015, Elsevier/Gold Standard. (2011-10-13 13:54:04)

## 2015-04-13 LAB — IPS OTHER TISSUE BIOPSY

## 2015-04-14 ENCOUNTER — Telehealth: Payer: Self-pay

## 2015-04-14 NOTE — Telephone Encounter (Signed)
-----  Message from Nunzio Cobbs, MD sent at 04/13/2015  6:58 PM EDT ----- Please inform patient of negative endometrial biopsy. No hyperplasia or malignancy were seen.   I think that the Mirena IUD may be a good option for the patient.  We are working on precerting this.   I would also like to know if the patient's sister had BRCA testing.  Sister had ovarian cancer.   Cc- Marisa Sprinkles

## 2015-04-14 NOTE — Telephone Encounter (Signed)
Left message to call Decklin Weddington at [phone]. 

## 2015-04-14 NOTE — Telephone Encounter (Signed)
Spoke with patient. Advised of results as seen below from Melba. Patient is agreeable and verbalizes understanding. Patient will speak with her sister regarding BRCA testing and will give our office a call to let Dr.Silva know. Aware will be contacted with insurance information for IUD then will get her scheduled as appropriate. Patient is agreeable.  Cc: Theresia Lo  Routing to provider for final review. Patient agreeable to disposition. Will close encounter.   Patient aware provider will review message and nurse will return call if any additional advice or change of disposition.

## 2015-04-14 NOTE — Telephone Encounter (Signed)
Patient stated that the reason was for thickening of the bladder walls, but to DISREGARD. She has already found a urologist and has an appt. No need for further action

## 2015-05-14 DIAGNOSIS — R3989 Other symptoms and signs involving the genitourinary system: Secondary | ICD-10-CM | POA: Insufficient documentation

## 2015-05-14 DIAGNOSIS — R339 Retention of urine, unspecified: Secondary | ICD-10-CM | POA: Insufficient documentation

## 2015-05-19 ENCOUNTER — Encounter: Payer: Self-pay | Admitting: Gastroenterology

## 2015-05-19 ENCOUNTER — Ambulatory Visit (INDEPENDENT_AMBULATORY_CARE_PROVIDER_SITE_OTHER): Payer: Commercial Managed Care - PPO | Admitting: Gastroenterology

## 2015-05-19 VITALS — BP 118/80 | HR 64 | Ht 62.0 in | Wt 182.0 lb

## 2015-05-19 DIAGNOSIS — R933 Abnormal findings on diagnostic imaging of other parts of digestive tract: Secondary | ICD-10-CM

## 2015-05-19 DIAGNOSIS — R1031 Right lower quadrant pain: Secondary | ICD-10-CM | POA: Diagnosis not present

## 2015-05-19 NOTE — Patient Instructions (Signed)
You have been scheduled for an endoscopy. Please follow written instructions given to you at your visit today. If you use inhalers (even only as needed), please bring them with you on the day of your procedure. Your physician has requested that you go to www.startemmi.com and enter the access code given to you at your visit today. This web site gives a general overview about your procedure. However, you should still follow specific instructions given to you by our office regarding your preparation for the procedure.  Normal BMI (Body Mass Index- based on height and weight) is between 19 and 25. Your BMI today is Body mass index is 33.28 kg/(m^2). Marland Kitchen Please consider follow up  regarding your BMI with your Primary Care Provider.  Thank you for choosing me and [facility].  Pricilla Riffle. Dagoberto Ligas., MD., [facility]  cc: Walker Kehr, MD

## 2015-05-19 NOTE — Progress Notes (Signed)
    History of Present Illness: This is a 49 year old female presenting for evaluation of right lower quadrant pain and abnormal CT scan of the stomach. She had worsening right lower quadrant pain and was evaluated in the ED. CT scan below. Her pain is exacerbated by movement and bending it does not correlate with any digestive function. Right lower quadrant pain has persisted but has gradually improved. She's been evaluated by her gynecologist for uterine fibroids and by her urologist for bladder wall thickening. She underwent endoscopy and 2012 that did not show any abnormalities in the gastric antrum or pylorus. Denies weight loss, constipation, diarrhea, change in stool caliber, melena, hematochezia, nausea, vomiting, dysphagia, reflux symptoms, chest pain.   IMPRESSION abd/pelvic CT 04/02/2015: Focal thickening of the gastric pylorus similar to the prior CT may be related to underdistention versus chronic inflammation. Underlying mass with possible partial gastric outlet obstruction is not excluded. Correlation with endoscopy recommended. No evidence of small bowel obstruction. Normal appendix.  Diffuse bladder wall thickening. Correlation with urinalysis recommended to exclude UTI.  Myomatous uterus.  Current Medications, Allergies, Past Medical History, Past Surgical History, Family History and Social History were reviewed in [employer] record.  Physical Exam: General: Well developed , well nourished, no acute distress Head: Normocephalic and atraumatic Eyes:  sclerae anicteric, EOMI Ears: Normal auditory acuity Mouth: No deformity or lesions Lungs: Clear throughout to auscultation Heart: Regular rate and rhythm; no murmurs, rubs or bruits Abdomen: Soft, right lower quadrant and right inguinal tenderness to palpation. Symptoms reproduced by hip flexion and bending and non distended. No masses, hepatosplenomegaly or hernias noted. Normal Bowel  sounds Musculoskeletal: Symmetrical with no gross deformities  Pulses:  Normal pulses noted Extremities: No clubbing, cyanosis, edema or deformities noted Neurological: Alert oriented x 4, grossly nonfocal Psychological:  Alert and cooperative. Normal mood and affect  Assessment and Recommendations:  1. Abnormal CT scan of the pylorus. Right lower quadrant/right inguinal pain appears to be musculoskeletal. Thickened pylorus region is likely related to under distention of the stomach however mass lesions, an inflammatory process and other disorders cannot be excluded. Schedule EGD. The risks (including bleeding, perforation, infection, missed lesions, medication reactions and possible hospitalization or surgery if complications occur), benefits, and alternatives to endoscopy with possible biopsy and possible dilation were discussed with the patient and they consent to proceed.   2. CRC screening, average risk. Colonoscopy at age 12 in July 2017.

## 2015-05-25 ENCOUNTER — Telehealth: Payer: Self-pay | Admitting: Obstetrics and Gynecology

## 2015-05-25 NOTE — Telephone Encounter (Signed)
Call to patient to discuss insurance benefits for IUD insertion. Left message on voicemail for patient to return my call.

## 2015-05-26 NOTE — Telephone Encounter (Signed)
Call to patient to discuss insurance benefits. Left message on patient's voicemail to return call to discuss.

## 2015-05-28 NOTE — Telephone Encounter (Signed)
Third call to patient to discuss insurance benefits for IUD insertion. Left message on voicemail for patient to return my call.   Routing to provider for final review. Patient agreeable to disposition. Will close encounter.

## 2015-06-02 ENCOUNTER — Ambulatory Visit (AMBULATORY_SURGERY_CENTER): Payer: Commercial Managed Care - PPO | Admitting: Gastroenterology

## 2015-06-02 ENCOUNTER — Encounter: Payer: Self-pay | Admitting: Gastroenterology

## 2015-06-02 VITALS — BP 152/92 | HR 52 | Temp 97.5°F | Resp 15 | Ht 62.0 in | Wt 182.0 lb

## 2015-06-02 DIAGNOSIS — R933 Abnormal findings on diagnostic imaging of other parts of digestive tract: Secondary | ICD-10-CM

## 2015-06-02 DIAGNOSIS — R1031 Right lower quadrant pain: Secondary | ICD-10-CM | POA: Diagnosis not present

## 2015-06-02 DIAGNOSIS — K317 Polyp of stomach and duodenum: Secondary | ICD-10-CM

## 2015-06-02 DIAGNOSIS — K2950 Unspecified chronic gastritis without bleeding: Secondary | ICD-10-CM | POA: Diagnosis not present

## 2015-06-02 DIAGNOSIS — R198 Other specified symptoms and signs involving the digestive system and abdomen: Secondary | ICD-10-CM

## 2015-06-02 MED ORDER — SODIUM CHLORIDE 0.9 % IV SOLN: 500.0000 mL | INTRAVENOUS | Status: DC

## 2015-06-02 NOTE — Progress Notes (Signed)
Report to PACU, RN, vss, BBS= Clear.  

## 2015-06-02 NOTE — Op Note (Signed)
[place]  [address] [place], [id number]   ENDOSCOPY PROCEDURE REPORT  PATIENT: [name], [name]  MR#: [id number] BIRTHDATE: March 29, 1966 , 49  yrs. old GENDER: female ENDOSCOPIST: Ladene Artist, MD, [facility] REFERRED BY:  Creig Hines, M.D. PROCEDURE DATE:  06/02/2015 PROCEDURE:  EGD w/ biopsy ASA CLASS:     Class II INDICATIONS:  abnormal CT of the GI tract. RLQ pain MEDICATIONS: Monitored anesthesia care and Propofol 200 mg IV TOPICAL ANESTHETIC: none DESCRIPTION OF PROCEDURE: After the risks benefits and alternatives of the procedure were thoroughly explained, informed consent was obtained.  The LB [id number] endoscope was introduced through the mouth and advanced to the second portion of the duodenum , Without limitations.  The instrument was slowly withdrawn as the mucosa was fully examined.    STOMACH: Multiple sessile polyps ranging between 3-51mm in size were found in the gastric body.  Multiple biopsies was performed of several of the larger polyps. The pylorus appeared normal.  The stomach otherwise appeared normal. ESOPHAGUS: The mucosa of the esophagus appeared normal. DUODENUM: The duodenal mucosa showed no abnormalities in the bulb and 2nd part of the duodenum.  Retroflexed views revealed no abnormalities.     The scope was then withdrawn from the patient and the procedure completed.  COMPLICATIONS: There were no immediate complications.  ENDOSCOPIC IMPRESSION: 1.   Multiple sessile polyps in the gastric body; multiple biopsies performed 2.   The EGD otherwise appeared normal  RECOMMENDATIONS: 1.  Await pathology results  eSigned:  Ladene Artist, MD, [facility] 06/02/2015 2:48 PM

## 2015-06-02 NOTE — Progress Notes (Signed)
Called to room to assist during endoscopic procedure.  Patient ID and intended procedure confirmed with present staff. Received instructions for my participation in the procedure from the performing physician.  

## 2015-06-02 NOTE — Patient Instructions (Addendum)
YOU HAD AN ENDOSCOPIC PROCEDURE TODAY AT [facility]:   Refer to the procedure report that was given to you for any specific questions about what was found during the examination.  If the procedure report does not answer your questions, please call your gastroenterologist to clarify.  If you requested that your care partner not be given the details of your procedure findings, then the procedure report has been included in a sealed envelope for you to review at your convenience later.  YOU SHOULD EXPECT: Some feelings of bloating in the abdomen. Passage of more gas than usual.  Walking can help get rid of the air that was put into your GI tract during the procedure and reduce the bloating. If you had a lower endoscopy (such as a colonoscopy or flexible sigmoidoscopy) you may notice spotting of blood in your stool or on the toilet paper. If you underwent a bowel prep for your procedure, you may not have a normal bowel movement for a few days.  Please Note:  You might notice some irritation and congestion in your nose or some drainage.  This is from the oxygen used during your procedure.  There is no need for concern and it should clear up in a day or so.  SYMPTOMS TO REPORT IMMEDIATELY:   Following lower endoscopy (colonoscopy or flexible sigmoidoscopy):  Excessive amounts of blood in the stool  Significant tenderness or worsening of abdominal pains  Swelling of the abdomen that is new, acute  Fever of 100F or higher   For urgent or emergent issues, a gastroenterologist can be reached at any hour by calling [phone].   DIET: Your first meal following the procedure should be a small meal and then it is ok to progress to your normal diet. Heavy or fried foods are harder to digest and may make you feel nauseous or bloated.  Likewise, meals heavy in dairy and vegetables can increase bloating.  Drink plenty of fluids but you should avoid alcoholic beverages for 24  hours.  ACTIVITY:  You should plan to take it easy for the rest of today and you should NOT DRIVE or use heavy machinery until tomorrow (because of the sedation medicines used during the test).    FOLLOW UP: Our staff will call the number listed on your records the next business day following your procedure to check on you and address any questions or concerns that you may have regarding the information given to you following your procedure. If we do not reach you, we will leave a message.  However, if you are feeling well and you are not experiencing any problems, there is no need to return our call.  We will assume that you have returned to your regular daily activities without incident.  If any biopsies were taken you will be contacted by phone or by letter within the next 1-3 weeks.  Please call us at [phone] if you have not heard about the biopsies in 3 weeks.    SIGNATURES/CONFIDENTIALITY:YOU HAD AN ENDOSCOPIC PROCEDURE TODAY AT [facility]:   Refer to the procedure report that was given to you for any specific questions about what was found during the examination.  If the procedure report does not answer your questions, please call your gastroenterologist to clarify.  If you requested that your care partner not be given the details of your procedure findings, then the procedure report has been included in a sealed envelope for you to review  at your convenience later.  YOU SHOULD EXPECT: Some feelings of bloating in the abdomen. Passage of more gas than usual.  Walking can help get rid of the air that was put into your GI tract during the procedure and reduce the bloating. If you had a lower endoscopy (such as a colonoscopy or flexible sigmoidoscopy) you may notice spotting of blood in your stool or on the toilet paper. If you underwent a bowel prep for your procedure, you may not have a normal bowel movement for a few days.  Please Note:  You might notice some irritation  and congestion in your nose or some drainage.  This is from the oxygen used during your procedure.  There is no need for concern and it should clear up in a day or so.  SYMPTOMS TO REPORT IMMEDIATELY:    Following upper endoscopy (EGD)  Vomiting of blood or coffee ground material  New chest pain or pain under the shoulder blades  Painful or persistently difficult swallowing  New shortness of breath  Fever of 100F or higher  Black, tarry-looking stools  For urgent or emergent issues, a gastroenterologist can be reached at any hour by calling [phone].   DIET: Your first meal following the procedure should be a small meal and then it is ok to progress to your normal diet. Heavy or fried foods are harder to digest and may make you feel nauseous or bloated.  Likewise, meals heavy in dairy and vegetables can increase bloating.  Drink plenty of fluids but you should avoid alcoholic beverages for 24 hours.  ACTIVITY:  You should plan to take it easy for the rest of today and you should NOT DRIVE or use heavy machinery until tomorrow (because of the sedation medicines used during the test).    FOLLOW UP: Our staff will call the number listed on your records the next business day following your procedure to check on you and address any questions or concerns that you may have regarding the information given to you following your procedure. If we do not reach you, we will leave a message.  However, if you are feeling well and you are not experiencing any problems, there is no need to return our call.  We will assume that you have returned to your regular daily activities without incident.  If any biopsies were taken you will be contacted by phone or by letter within the next 1-3 weeks.  Please call us at [phone] if you have not heard about the biopsies in 3 weeks.    SIGNATURES/CONFIDENTIALITY: You and/or your care partner have signed paperwork which will be entered into your  electronic medical record.  These signatures attest to the fact that that the information above on your After Visit Summary has been reviewed and is understood.  Full responsibility of the confidentiality of this discharge information lies with you and/or your care-partner. You and/or your care partner have signed paperwork which will be entered into your electronic medical record.  These signatures attest to the fact that that the information above on your After Visit Summary has been reviewed and is understood.  Full responsibility of the confidentiality of this discharge information lies with you and/or your care-partner.  Polyp handout given Await pathology report

## 2015-06-03 ENCOUNTER — Telehealth: Payer: Self-pay | Admitting: *Deleted

## 2015-06-03 NOTE — Telephone Encounter (Signed)
Message left

## 2015-06-10 ENCOUNTER — Encounter: Payer: Self-pay | Admitting: Gastroenterology

## 2015-06-24 ENCOUNTER — Ambulatory Visit: Payer: Commercial Managed Care - PPO | Admitting: Certified Nurse Midwife

## 2015-06-27 DIAGNOSIS — D126 Benign neoplasm of colon, unspecified: Secondary | ICD-10-CM

## 2015-06-27 HISTORY — DX: Benign neoplasm of colon, unspecified: D12.6

## 2015-07-08 ENCOUNTER — Ambulatory Visit (AMBULATORY_SURGERY_CENTER): Payer: Self-pay | Admitting: *Deleted

## 2015-07-08 VITALS — Ht 62.0 in | Wt 181.0 lb

## 2015-07-08 DIAGNOSIS — Z800 Family history of malignant neoplasm of digestive organs: Secondary | ICD-10-CM

## 2015-07-08 MED ORDER — NA SULFATE-K SULFATE-MG SULF 17.5-3.13-1.6 GM/177ML PO SOLN
ORAL | Status: DC
Start: 2015-07-08 — End: 2015-07-22

## 2015-07-08 NOTE — Progress Notes (Signed)
Patient denies any allergies to eggs or soy. Patient denies any problems with anesthesia/sedation. Patient denies any oxygen use at home and does not take any diet/weight loss medications. EMMI education assisgned to patient on colonoscopy, this was explained and instructions given to patient. 

## 2015-07-10 ENCOUNTER — Telehealth: Payer: Self-pay | Admitting: Gastroenterology

## 2015-07-10 NOTE — Telephone Encounter (Signed)
Called pt back.  She requested Miralax.  Reviewed her instructions.  Mailed her a copy.  Cevin Rubinstein/PV

## 2015-07-22 ENCOUNTER — Ambulatory Visit (AMBULATORY_SURGERY_CENTER): Payer: Commercial Managed Care - PPO | Admitting: Gastroenterology

## 2015-07-22 ENCOUNTER — Encounter: Payer: Self-pay | Admitting: Gastroenterology

## 2015-07-22 VITALS — BP 132/77 | HR 50 | Temp 97.5°F | Resp 22 | Ht 62.0 in | Wt 181.0 lb

## 2015-07-22 DIAGNOSIS — D125 Benign neoplasm of sigmoid colon: Secondary | ICD-10-CM

## 2015-07-22 DIAGNOSIS — Z800 Family history of malignant neoplasm of digestive organs: Secondary | ICD-10-CM | POA: Diagnosis not present

## 2015-07-22 DIAGNOSIS — Z1211 Encounter for screening for malignant neoplasm of colon: Secondary | ICD-10-CM | POA: Diagnosis not present

## 2015-07-22 DIAGNOSIS — K635 Polyp of colon: Secondary | ICD-10-CM

## 2015-07-22 DIAGNOSIS — D123 Benign neoplasm of transverse colon: Secondary | ICD-10-CM

## 2015-07-22 MED ORDER — SODIUM CHLORIDE 0.9 % IV SOLN: 500.0000 mL | INTRAVENOUS | Status: DC

## 2015-07-22 NOTE — Patient Instructions (Signed)
YOU HAD AN ENDOSCOPIC PROCEDURE TODAY AT [facility]:   Refer to the procedure report that was given to you for any specific questions about what was found during the examination.  If the procedure report does not answer your questions, please call your gastroenterologist to clarify.  If you requested that your care partner not be given the details of your procedure findings, then the procedure report has been included in a sealed envelope for you to review at your convenience later.  YOU SHOULD EXPECT: Some feelings of bloating in the abdomen. Passage of more gas than usual.  Walking can help get rid of the air that was put into your GI tract during the procedure and reduce the bloating. If you had a lower endoscopy (such as a colonoscopy or flexible sigmoidoscopy) you may notice spotting of blood in your stool or on the toilet paper. If you underwent a bowel prep for your procedure, you may not have a normal bowel movement for a few days.  Please Note:  You might notice some irritation and congestion in your nose or some drainage.  This is from the oxygen used during your procedure.  There is no need for concern and it should clear up in a day or so.  SYMPTOMS TO REPORT IMMEDIATELY:   Following lower endoscopy (colonoscopy or flexible sigmoidoscopy):  Excessive amounts of blood in the stool  Significant tenderness or worsening of abdominal pains  Swelling of the abdomen that is new, acute  Fever of 100F or higher    For urgent or emergent issues, a gastroenterologist can be reached at any hour by calling [phone].   DIET: Your first meal following the procedure should be a small meal and then it is ok to progress to your normal diet. Heavy or fried foods are harder to digest and may make you feel nauseous or bloated.  Likewise, meals heavy in dairy and vegetables can increase bloating.  Drink plenty of fluids but you should avoid alcoholic beverages for 24  hours.  ACTIVITY:  You should plan to take it easy for the rest of today and you should NOT DRIVE or use heavy machinery until tomorrow (because of the sedation medicines used during the test).    FOLLOW UP: Our staff will call the number listed on your records the next business day following your procedure to check on you and address any questions or concerns that you may have regarding the information given to you following your procedure. If we do not reach you, we will leave a message.  However, if you are feeling well and you are not experiencing any problems, there is no need to return our call.  We will assume that you have returned to your regular daily activities without incident.  If any biopsies were taken you will be contacted by phone or by letter within the next 1-3 weeks.  Please call us at [phone] if you have not heard about the biopsies in 3 weeks.    SIGNATURES/CONFIDENTIALITY: You and/or your care partner have signed paperwork which will be entered into your electronic medical record.  These signatures attest to the fact that that the information above on your After Visit Summary has been reviewed and is understood.  Full responsibility of the confidentiality of this discharge information lies with you and/or your care-partner.   Information on polyps given to you today  Await letter with pathology report

## 2015-07-22 NOTE — Op Note (Signed)
[address] [place], [id number]   COLONOSCOPY PROCEDURE REPORT  PATIENT: [name], [name]  MR#: [id number] BIRTHDATE: May 12, 1966 , 49  yrs. old GENDER: female ENDOSCOPIST: Ladene Artist, MD, [facility] REFERRED BY: Creig Hines MD PROCEDURE DATE:  07/22/2015 PROCEDURE:   Colonoscopy, screening and Colonoscopy with snare polypectomy First Screening Colonoscopy - Avg.  risk and is 50 yrs.  old or older - No.  Prior Negative Screening - Now for repeat screening. N/A  History of Adenoma - Now for follow-up colonoscopy & has been > or = to 3 yrs.  N/A  Polyps removed today? Yes ASA CLASS:   Class II INDICATIONS: Screening for colonic neoplasia and FH Colon or Rectal Adenocarcinoma. MEDICATIONS: Monitored anesthesia care, Propofol 300 mg IV, and lidocaine 40 mg IV DESCRIPTION OF PROCEDURE:   After the risks benefits and alternatives of the procedure were thoroughly explained, informed consent was obtained.  The digital rectal exam revealed no abnormalities of the rectum.   The LB [id number]-[id number]  endoscope was introduced through the anus and advanced to the cecum, which was identified by both the appendix and ileocecal valve. No adverse events experienced with a tortuous colon.   The quality of the prep was good.  (MiraLax was used)  The instrument was then slowly withdrawn as the colon was fully examined. Estimated blood loss is zero unless otherwise noted in this procedure report.    COLON FINDINGS: Three sessile polyps measuring 5 mm in size were found in the sigmoid colon and transverse colon.  Polypectomies were performed with a cold snare.  The resection was complete, the polyp tissue was completely retrieved and sent to histology.   The examination was otherwise normal.  Retroflexed views revealed no abnormalities. The time to cecum = 4.1 Withdrawal time = 17.9   The scope was withdrawn and the procedure completed. COMPLICATIONS: There  were no immediate complications.  ENDOSCOPIC IMPRESSION: 1.   Three sessile polyps in the sigmoid colon and transverse colon; polypectomies performed with a cold snare 2.   The examination was otherwise normal  RECOMMENDATIONS: 1.  Await pathology results 2.  Repeat Colonoscopy in 5 years.  eSigned:  Ladene Artist, MD, [facility] 07/22/2015 3:16 PM

## 2015-07-22 NOTE — Progress Notes (Signed)
Called to room to assist during endoscopic procedure.  Patient ID and intended procedure confirmed with present staff. Received instructions for my participation in the procedure from the performing physician.  

## 2015-07-22 NOTE — Progress Notes (Signed)
Pt got dizzy after dressed and tried to walk to bathroom;therefore back to stretcher with cold cloth to head & neck then given cold juice ,feeling better. A Willis,RN w/ pt .

## 2015-07-23 ENCOUNTER — Telehealth: Payer: Self-pay | Admitting: *Deleted

## 2015-07-23 NOTE — Telephone Encounter (Signed)
  Follow up Call-  Call back number 07/22/2015 06/02/2015  Post procedure Call Back phone  # [phone] cell  Permission to leave phone message Yes Yes     Patient questions:  Do you have a fever, pain , or abdominal swelling? No. Pain Score  0 *  Have you tolerated food without any problems? Yes.    Have you been able to return to your normal activities? Yes.    Do you have any questions about your discharge instructions: Diet   No. Medications  No. Follow up visit  No.  Do you have questions or concerns about your Care? No.  Actions: * If pain score is 4 or above: No action needed, pain <4.

## 2015-07-27 ENCOUNTER — Encounter: Payer: Self-pay | Admitting: Gastroenterology

## 2015-12-03 ENCOUNTER — Encounter (HOSPITAL_COMMUNITY): Payer: Self-pay | Admitting: Emergency Medicine

## 2015-12-03 ENCOUNTER — Emergency Department (HOSPITAL_COMMUNITY): Payer: Commercial Managed Care - PPO

## 2015-12-03 DIAGNOSIS — Z79899 Other long term (current) drug therapy: Secondary | ICD-10-CM | POA: Insufficient documentation

## 2015-12-03 DIAGNOSIS — I10 Essential (primary) hypertension: Secondary | ICD-10-CM | POA: Diagnosis not present

## 2015-12-03 DIAGNOSIS — K219 Gastro-esophageal reflux disease without esophagitis: Secondary | ICD-10-CM | POA: Diagnosis not present

## 2015-12-03 DIAGNOSIS — E039 Hypothyroidism, unspecified: Secondary | ICD-10-CM | POA: Diagnosis not present

## 2015-12-03 DIAGNOSIS — Z923 Personal history of irradiation: Secondary | ICD-10-CM | POA: Diagnosis not present

## 2015-12-03 DIAGNOSIS — Z85850 Personal history of malignant neoplasm of thyroid: Secondary | ICD-10-CM | POA: Diagnosis not present

## 2015-12-03 DIAGNOSIS — E785 Hyperlipidemia, unspecified: Secondary | ICD-10-CM | POA: Diagnosis not present

## 2015-12-03 DIAGNOSIS — I25110 Atherosclerotic heart disease of native coronary artery with unstable angina pectoris: Secondary | ICD-10-CM | POA: Diagnosis not present

## 2015-12-03 NOTE — ED Notes (Signed)
Pt reports chest discomfort into neck and L shoulder intermittently all day today and feeling like her heart was beating fast. Pt with hx of ablation d/t frequent PVC's

## 2015-12-04 ENCOUNTER — Encounter (HOSPITAL_COMMUNITY)
Admission: EM | Disposition: A | Discharge status: Home / Self Care | Payer: Self-pay | Source: Home / Self Care | Attending: Internal Medicine

## 2015-12-04 ENCOUNTER — Observation Stay (HOSPITAL_COMMUNITY)
Admission: EM | Admit: 2015-12-04 | Discharge: 2015-12-08 | Disposition: A | Discharge status: Home / Self Care | Payer: Commercial Managed Care - PPO | Attending: Cardiology | Admitting: Cardiology

## 2015-12-04 DIAGNOSIS — I10 Essential (primary) hypertension: Secondary | ICD-10-CM | POA: Diagnosis not present

## 2015-12-04 DIAGNOSIS — I2510 Atherosclerotic heart disease of native coronary artery without angina pectoris: Secondary | ICD-10-CM | POA: Diagnosis present

## 2015-12-04 DIAGNOSIS — K219 Gastro-esophageal reflux disease without esophagitis: Secondary | ICD-10-CM | POA: Diagnosis present

## 2015-12-04 DIAGNOSIS — R079 Chest pain, unspecified: Secondary | ICD-10-CM

## 2015-12-04 DIAGNOSIS — R0789 Other chest pain: Secondary | ICD-10-CM | POA: Diagnosis present

## 2015-12-04 DIAGNOSIS — Z8719 Personal history of other diseases of the digestive system: Secondary | ICD-10-CM

## 2015-12-04 DIAGNOSIS — I25110 Atherosclerotic heart disease of native coronary artery with unstable angina pectoris: Secondary | ICD-10-CM

## 2015-12-04 DIAGNOSIS — R1013 Epigastric pain: Secondary | ICD-10-CM | POA: Diagnosis not present

## 2015-12-04 DIAGNOSIS — E785 Hyperlipidemia, unspecified: Secondary | ICD-10-CM | POA: Diagnosis present

## 2015-12-04 DIAGNOSIS — Z955 Presence of coronary angioplasty implant and graft: Secondary | ICD-10-CM

## 2015-12-04 HISTORY — DX: Family history of other specified conditions: Z84.89

## 2015-12-04 HISTORY — DX: Hypothyroidism, unspecified: E03.9

## 2015-12-04 HISTORY — PX: CARDIAC CATHETERIZATION: SHX172

## 2015-12-04 LAB — CBC
HCT: 38.3 % (ref 36.0–46.0)
HCT: 39.2 % (ref 36.0–46.0)
HEMOGLOBIN: 12.7 g/dL (ref 12.0–15.0)
Hemoglobin: 13.1 g/dL (ref 12.0–15.0)
MCH: 29.6 pg (ref 26.0–34.0)
MCH: 29.7 pg (ref 26.0–34.0)
MCHC: 33.2 g/dL (ref 30.0–36.0)
MCHC: 33.4 g/dL (ref 30.0–36.0)
MCV: 88.9 fL (ref 78.0–100.0)
MCV: 89.3 fL (ref 78.0–100.0)
PLATELETS: 274 10*3/uL (ref 150–400)
Platelets: 285 10*3/uL (ref 150–400)
RBC: 4.29 MIL/uL (ref 3.87–5.11)
RBC: 4.41 MIL/uL (ref 3.87–5.11)
RDW: 14.7 % (ref 11.5–15.5)
RDW: 14.8 % (ref 11.5–15.5)
WBC: 7.1 10*3/uL (ref 4.0–10.5)
WBC: 7.7 10*3/uL (ref 4.0–10.5)

## 2015-12-04 LAB — RAPID URINE DRUG SCREEN, HOSP PERFORMED
Amphetamines: NOT DETECTED
Barbiturates: NOT DETECTED
Benzodiazepines: NOT DETECTED
Cocaine: NOT DETECTED
Opiates: NOT DETECTED
Tetrahydrocannabinol: NOT DETECTED

## 2015-12-04 LAB — BASIC METABOLIC PANEL
Anion gap: 7 (ref 5–15)
BUN: 8 mg/dL (ref 6–20)
CHLORIDE: 108 mmol/L (ref 101–111)
CO2: 24 mmol/L (ref 22–32)
Calcium: 9.1 mg/dL (ref 8.9–10.3)
Creatinine, Ser: 0.9 mg/dL (ref 0.44–1.00)
GFR calc Af Amer: >60 mL/min (ref >60)
GFR calc non Af Amer: >60 mL/min (ref >60)
GLUCOSE: 104 mg/dL — AB (ref 65–99)
POTASSIUM: 3.9 mmol/L (ref 3.5–5.1)
Sodium: 139 mmol/L (ref 135–145)

## 2015-12-04 LAB — PROTIME-INR
INR: 1.1 (ref 0.00–1.49)
Prothrombin Time: 14.4 seconds (ref 11.6–15.2)

## 2015-12-04 LAB — BASIC METABOLIC PANEL WITH GFR
Anion gap: 12 (ref 5–15)
BUN: 8 mg/dL (ref 6–20)
CO2: 22 mmol/L (ref 22–32)
Calcium: 9.5 mg/dL (ref 8.9–10.3)
Chloride: 107 mmol/L (ref 101–111)
Creatinine, Ser: 0.83 mg/dL (ref 0.44–1.00)
GFR calc Af Amer: >60 mL/min
GFR calc non Af Amer: >60 mL/min
Glucose, Bld: 105 mg/dL — ABNORMAL HIGH (ref 65–99)
Potassium: 3.9 mmol/L (ref 3.5–5.1)
Sodium: 141 mmol/L (ref 135–145)

## 2015-12-04 LAB — I-STAT TROPONIN, ED
TROPONIN I, POC: 0 ng/mL (ref 0.00–0.08)
Troponin i, poc: 0 ng/mL (ref 0.00–0.08)
Troponin i, poc: 0.02 ng/mL (ref 0.00–0.08)

## 2015-12-04 LAB — LIPID PANEL
CHOL/HDL RATIO: 3.9 ratio
CHOLESTEROL: 212 mg/dL — AB (ref 0–200)
HDL: 55 mg/dL (ref >40)
LDL Cholesterol: 139 mg/dL — ABNORMAL HIGH (ref 0–99)
Triglycerides: 89 mg/dL (ref <150)
VLDL: 18 mg/dL (ref 0–40)

## 2015-12-04 LAB — APTT: APTT: 28 s (ref 24–37)

## 2015-12-04 LAB — LIPASE, BLOOD: Lipase: 30 U/L (ref 11–51)

## 2015-12-04 LAB — POCT ACTIVATED CLOTTING TIME: ACTIVATED CLOTTING TIME: 240 s

## 2015-12-04 LAB — TSH: TSH: 8.712 u[IU]/mL — AB (ref 0.350–4.500)

## 2015-12-04 SURGERY — LEFT HEART CATH AND CORONARY ANGIOGRAPHY

## 2015-12-04 MED ORDER — MIDAZOLAM HCL 2 MG/2ML IJ SOLN
INTRAMUSCULAR | Status: DC | PRN
  Administered 2015-12-04: 2 mg via INTRAVENOUS

## 2015-12-04 MED ORDER — LIDOCAINE HCL (PF) 1 % IJ SOLN
INTRAMUSCULAR | Status: DC | PRN
  Administered 2015-12-04: 5 mL

## 2015-12-04 MED ORDER — LISINOPRIL 10 MG PO TABS
20.0000 mg | ORAL_TABLET | Freq: Every day | ORAL | Status: DC
Start: 2015-12-04 — End: 2015-12-08
  Administered 2015-12-04 – 2015-12-08 (×5): 20 mg via ORAL
  Filled 2015-12-04: qty 1
  Filled 2015-12-04 (×2): qty 2
  Filled 2015-12-04 (×4): qty 1

## 2015-12-04 MED ORDER — VITAMIN D 1000 UNITS PO TABS
1000.0000 [IU] | ORAL_TABLET | Freq: Every day | ORAL | Status: DC
Start: 2015-12-04 — End: 2015-12-08
  Administered 2015-12-04 – 2015-12-08 (×5): 1000 [IU] via ORAL
  Filled 2015-12-04 (×5): qty 1

## 2015-12-04 MED ORDER — HEPARIN SODIUM (PORCINE) 1000 UNIT/ML IJ SOLN
INTRAMUSCULAR | Status: AC
  Filled 2015-12-04: qty 1

## 2015-12-04 MED ORDER — VERAPAMIL HCL 2.5 MG/ML IV SOLN
INTRAVENOUS | Status: AC
  Filled 2015-12-04: qty 2

## 2015-12-04 MED ORDER — MIDAZOLAM HCL 2 MG/2ML IJ SOLN
INTRAMUSCULAR | Status: AC
  Filled 2015-12-04: qty 2

## 2015-12-04 MED ORDER — HEPARIN (PORCINE) IN NACL 2-0.9 UNIT/ML-% IJ SOLN
INTRAMUSCULAR | Status: AC
  Filled 2015-12-04: qty 500

## 2015-12-04 MED ORDER — SODIUM CHLORIDE 0.9 % IV SOLN: 250.0000 mL | INTRAVENOUS | Status: DC | PRN

## 2015-12-04 MED ORDER — HYDROMORPHONE HCL 1 MG/ML IJ SOLN
INTRAMUSCULAR | Status: AC
  Filled 2015-12-04: qty 1

## 2015-12-04 MED ORDER — ATENOLOL 50 MG PO TABS
50.0000 mg | ORAL_TABLET | Freq: Every day | ORAL | Status: DC
  Administered 2015-12-05 – 2015-12-08 (×4): 50 mg via ORAL
  Filled 2015-12-04 (×8): qty 1

## 2015-12-04 MED ORDER — HEPARIN SODIUM (PORCINE) 5000 UNIT/ML IJ SOLN
5000.0000 [IU] | Freq: Three times a day (TID) | INTRAMUSCULAR | Status: DC
Start: 2015-12-04 — End: 2015-12-04
  Administered 2015-12-04 (×2): 5000 [IU] via SUBCUTANEOUS
  Filled 2015-12-04 (×2): qty 1

## 2015-12-04 MED ORDER — ROSUVASTATIN CALCIUM 20 MG PO TABS
20.0000 mg | ORAL_TABLET | Freq: Every day | ORAL | Status: DC
Start: 2015-12-04 — End: 2015-12-08
  Administered 2015-12-04 – 2015-12-07 (×4): 20 mg via ORAL
  Filled 2015-12-04 (×5): qty 1

## 2015-12-04 MED ORDER — ADENOSINE 12 MG/4ML IV SOLN
16.0000 mL | Freq: Once | INTRAVENOUS | Status: DC
  Filled 2015-12-04: qty 16

## 2015-12-04 MED ORDER — ZOLPIDEM TARTRATE 5 MG PO TABS
5.0000 mg | ORAL_TABLET | Freq: Every evening | ORAL | Status: DC | PRN
  Administered 2015-12-04: 5 mg via ORAL
  Filled 2015-12-04: qty 1

## 2015-12-04 MED ORDER — ASPIRIN 81 MG PO CHEW
324.0000 mg | CHEWABLE_TABLET | Freq: Once | ORAL | Status: AC
  Administered 2015-12-04: 324 mg via ORAL
  Filled 2015-12-04: qty 4

## 2015-12-04 MED ORDER — ASPIRIN 81 MG PO CHEW
324.0000 mg | CHEWABLE_TABLET | Freq: Every day | ORAL | Status: DC
  Administered 2015-12-04: 324 mg via ORAL
  Filled 2015-12-04: qty 4

## 2015-12-04 MED ORDER — ATENOLOL 25 MG PO TABS
25.0000 mg | ORAL_TABLET | Freq: Every day | ORAL | Status: DC
  Administered 2015-12-04: 25 mg via ORAL
  Filled 2015-12-04 (×2): qty 1

## 2015-12-04 MED ORDER — HYDROMORPHONE HCL 1 MG/ML IJ SOLN
INTRAMUSCULAR | Status: DC | PRN
  Administered 2015-12-04: 0.5 mg via INTRAVENOUS

## 2015-12-04 MED ORDER — PANTOPRAZOLE SODIUM 40 MG PO TBEC
40.0000 mg | DELAYED_RELEASE_TABLET | Freq: Every day | ORAL | Status: DC
  Administered 2015-12-04: 40 mg via ORAL
  Filled 2015-12-04: qty 1

## 2015-12-04 MED ORDER — HEPARIN (PORCINE) IN NACL 2-0.9 UNIT/ML-% IJ SOLN
INTRAMUSCULAR | Status: AC
  Filled 2015-12-04: qty 1000

## 2015-12-04 MED ORDER — HEPARIN SODIUM (PORCINE) 1000 UNIT/ML IJ SOLN
INTRAMUSCULAR | Status: DC | PRN
  Administered 2015-12-04: 6000 [IU] via INTRAVENOUS
  Administered 2015-12-04: 2000 [IU] via INTRAVENOUS

## 2015-12-04 MED ORDER — BUTALBITAL-APAP-CAFFEINE 50-325-40 MG PO TABS
1.0000 | ORAL_TABLET | Freq: Once | ORAL | Status: AC
  Administered 2015-12-04: 1 via ORAL
  Filled 2015-12-04: qty 1

## 2015-12-04 MED ORDER — SODIUM CHLORIDE 0.9% FLUSH: 3.0000 mL | INTRAVENOUS | Status: DC | PRN

## 2015-12-04 MED ORDER — ROSUVASTATIN CALCIUM 10 MG PO TABS: 10.0000 mg | ORAL_TABLET | Freq: Every day | ORAL | Status: DC

## 2015-12-04 MED ORDER — ASPIRIN EC 325 MG PO TBEC
325.0000 mg | DELAYED_RELEASE_TABLET | Freq: Every day | ORAL | Status: DC
  Administered 2015-12-05 – 2015-12-06 (×2): 325 mg via ORAL
  Filled 2015-12-04 (×3): qty 1

## 2015-12-04 MED ORDER — HYDRALAZINE HCL 20 MG/ML IJ SOLN: 5.0000 mg | INTRAMUSCULAR | Status: DC | PRN

## 2015-12-04 MED ORDER — NITROGLYCERIN 1 MG/10 ML FOR IR/CATH LAB
INTRA_ARTERIAL | Status: AC
Start: 2015-12-04 — End: 2015-12-04
  Filled 2015-12-04: qty 10

## 2015-12-04 MED ORDER — ADULT MULTIVITAMIN W/MINERALS CH
1.0000 | ORAL_TABLET | Freq: Every day | ORAL | Status: DC
  Administered 2015-12-04: 1 via ORAL
  Filled 2015-12-04: qty 1

## 2015-12-04 MED ORDER — MORPHINE SULFATE (PF) 2 MG/ML IV SOLN: 2.0000 mg | INTRAVENOUS | Status: DC | PRN

## 2015-12-04 MED ORDER — TAMSULOSIN HCL 0.4 MG PO CAPS
0.4000 mg | ORAL_CAPSULE | Freq: Every day | ORAL | Status: DC
  Administered 2015-12-04: 0.4 mg via ORAL

## 2015-12-04 MED ORDER — LIDOCAINE HCL (PF) 1 % IJ SOLN
INTRAMUSCULAR | Status: AC
Start: 2015-12-04 — End: 2015-12-04
  Filled 2015-12-04: qty 30

## 2015-12-04 MED ORDER — NITROGLYCERIN 0.4 MG SL SUBL
0.4000 mg | SUBLINGUAL_TABLET | SUBLINGUAL | Status: DC | PRN
  Administered 2015-12-04 – 2015-12-07 (×5): 0.4 mg via SUBLINGUAL
  Filled 2015-12-04 (×3): qty 1

## 2015-12-04 MED ORDER — ADENOSINE (DIAGNOSTIC) 140MCG/KG/MIN
INTRAVENOUS | Status: DC | PRN
  Administered 2015-12-04: 140 ug/kg/min via INTRAVENOUS

## 2015-12-04 MED ORDER — VERAPAMIL HCL 2.5 MG/ML IV SOLN
INTRA_ARTERIAL | Status: DC | PRN
  Administered 2015-12-04: 16:00:00 via INTRA_ARTERIAL

## 2015-12-04 MED ORDER — IOHEXOL 350 MG/ML SOLN
INTRAVENOUS | Status: DC | PRN
  Administered 2015-12-04: 95 mL via INTRA_ARTERIAL

## 2015-12-04 MED ORDER — NITROGLYCERIN IN D5W 200-5 MCG/ML-% IV SOLN
2.0000 ug/min | INTRAVENOUS | Status: DC
  Administered 2015-12-04: 5 ug/min via INTRAVENOUS
  Filled 2015-12-04: qty 250

## 2015-12-04 MED ORDER — AMLODIPINE BESYLATE 5 MG PO TABS
5.0000 mg | ORAL_TABLET | Freq: Every day | ORAL | Status: DC
  Administered 2015-12-04 – 2015-12-08 (×5): 5 mg via ORAL
  Filled 2015-12-04 (×6): qty 1

## 2015-12-04 MED ORDER — SODIUM CHLORIDE 0.9 % WEIGHT BASED INFUSION
3.0000 mL/kg/h | INTRAVENOUS | Status: DC
  Administered 2015-12-04: 3 mL/kg/h via INTRAVENOUS

## 2015-12-04 MED ORDER — ONDANSETRON HCL 4 MG/2ML IJ SOLN
4.0000 mg | Freq: Three times a day (TID) | INTRAMUSCULAR | Status: DC | PRN
  Administered 2015-12-07: 11:00:00 4 mg via INTRAVENOUS
  Filled 2015-12-04: qty 2

## 2015-12-04 MED ORDER — HYDROMORPHONE HCL 1 MG/ML IJ SOLN: 1.0000 mg | INTRAMUSCULAR | Status: DC | PRN

## 2015-12-04 MED ORDER — ACETAMINOPHEN 325 MG PO TABS
650.0000 mg | ORAL_TABLET | ORAL | Status: DC | PRN
  Administered 2015-12-04 – 2015-12-07 (×3): 650 mg via ORAL
  Filled 2015-12-04 (×3): qty 2

## 2015-12-04 MED ORDER — SODIUM CHLORIDE 0.9% FLUSH
3.0000 mL | Freq: Two times a day (BID) | INTRAVENOUS | Status: DC
  Administered 2015-12-04 – 2015-12-08 (×4): 3 mL via INTRAVENOUS

## 2015-12-04 MED ORDER — HEPARIN (PORCINE) IN NACL 2-0.9 UNIT/ML-% IJ SOLN
INTRAMUSCULAR | Status: DC | PRN
  Administered 2015-12-04: 1500 mL

## 2015-12-04 MED ORDER — SODIUM CHLORIDE 0.9 % IV SOLN
INTRAVENOUS | Status: DC
  Administered 2015-12-04: 08:00:00 via INTRAVENOUS

## 2015-12-04 SURGICAL SUPPLY — 14 items
CATH INFINITI 5 FR JL3.5 (CATHETERS) ×3 IMPLANT
CATH MICROCATH NAVVUS (MICROCATHETER) ×1 IMPLANT
CATH OPTITORQUE SARAH 4.5 5F (CATHETERS) ×3 IMPLANT
CATH VISTA GUIDE 6FR XBLAD3.0 (CATHETERS) ×3 IMPLANT
DEVICE RAD COMP TR BAND LRG (VASCULAR PRODUCTS) ×3 IMPLANT
GLIDESHEATH SLEND A-KIT 6F 20G (SHEATH) ×3 IMPLANT
KIT ENCORE 26 ADVANTAGE (KITS) ×3 IMPLANT
KIT HEART LEFT (KITS) ×3 IMPLANT
MICROCATHETER NAVVUS (MICROCATHETER) ×3
PACK CARDIAC CATHETERIZATION (CUSTOM PROCEDURE TRAY) ×3 IMPLANT
TRANSDUCER W/STOPCOCK (MISCELLANEOUS) ×3 IMPLANT
TUBING CIL FLEX 10 FLL-RA (TUBING) ×3 IMPLANT
WIRE RUNTHROUGH .014X180CM (WIRE) ×3 IMPLANT
WIRE SAFE-T 1.5MM-J .035X260CM (WIRE) ×3 IMPLANT

## 2015-12-04 NOTE — H&P (Addendum)
[facility] History and Physical  [name] [id number] DOB: 1966-05-14 DOA: 12/04/2015  Referring physician: ED physician PCP: Walker Kehr, MD  Specialists:   Chief Complaint: Chest pain and epigastric abdominal pain  HPI: [name] is a 50 y.o. female with PMH of hypertension, hyperlipidemia (statin allergy), GERD, CAD, thyroid cancer (S/p of radiation therapy), PVC(S/P of ablation), pancreatitis, who presents with chest pain and epigastric abdominal pain.  He reports that she started having chest pain at about 8 PM. It is located in the left chest, constant, 9 out of 10 in severity, pressure-like pain. It is associated with shortness breath and palpitation. Her chest pain is radiating to the left neck, making left neck feeling stiff. She had mild dry cough early, which has resolved completely. Patient does not have fever, chills, tenderness over calf areas. Patient states that she started having epigastric abdominal pain in the emergency room. The pain is moderate, nonradiating. No nausea, vomiting, diarrhea. Patient does not have symptoms of UTI or unilateral weakness. Of note, patient states that her cardiologist in [facility], Dr. Minna Merritts did cardiac cath and tried to put stent for her without success 5 years ago. She has an appointment with Dr. Minna Merritts on 12/14/15.  In ED, patient was found to have negative troponin, WBC 7.7, temperature normal, no tachycardia, electrolytes and renal function okay, negative chest x-ray for acute abnormalities. Patient is admitted to inpatient for further interventional treatment.  EKG: Independently reviewed. QTC 14, PVC, nonspecific T-wave change, first degree AV block  Where does patient live?   At home Can patient participate in ADLs?  Yes   Review of Systems:   General: no fevers, chills, no changes in body weight, has fatigue HEENT: no blurry vision, hearing changes or sore throat Pulm: no dyspnea, coughing, wheezing CV:  has chest pain and palpitations Abd: no nausea, vomiting, has abdominal pain, no diarrhea, constipation GU: no dysuria, burning on urination, increased urinary frequency, hematuria  Ext: no leg edema Neuro: no unilateral weakness, numbness, or tingling, no vision change or hearing loss Skin: no rash MSK: No muscle spasm, no deformity, no limitation of range of movement in spin Heme: No easy bruising.  Travel history: No recent long distant travel.  Allergy:  Allergies  Allergen Reactions  . Lipitor [Atorvastatin] Other (See Comments)    Muscle aches and pains  . Lisinopril Cough    Past Medical History  Diagnosis Date  . Hypertension   . CAD (coronary artery disease) 2009    Dr Olegario Shearer [facility], Heart Cath  . Hyperlipidemia   . Statin intolerance   . Acute pancreatitis 2011  . GERD (gastroesophageal reflux disease)   . Migraines   . Thyroid disease     hypothyroid, thyroid nodule  . Cancer [facility])     thyroid treated with radiation  . STD (sexually transmitted disease)     CHL 20 yrs ago  . Anemia   . PVC (premature ventricular contraction)   . Allergy     Past Surgical History  Procedure Laterality Date  . Cholecystectomy  2008  . Cardiac catheterization      unable to do cardiac cath but does stress echo 11/22/11  . Tubal ligation  1995    BTL  . Heart ablation  2006,2010    times 2, follow-up for v-tack  . Upper gastrointestinal endoscopy      Social History:  reports that she has never smoked. She has never used smokeless tobacco. She reports that  she does not drink alcohol or use illicit drugs.  Family History:  Family History  Problem Relation Age of Onset  . Hypertension Father   . Prostate cancer Father   . Heart attack Father   . Colon cancer Father 61  . Heart attack Cousin   . Diabetes Maternal Grandmother   . Hypertension Mother   . Diabetes Mother   . Cancer Sister     ovarian  . Diabetes Brother   . Esophageal cancer Neg Hx   . Rectal  cancer Neg Hx   . Stomach cancer Neg Hx   . Crohn's disease Daughter      Prior to Admission medications   Medication Sig Start Date End Date Taking? Authorizing Provider  atenolol (TENORMIN) 25 MG tablet Take 25 mg by mouth daily.    Historical Provider, MD  Cholecalciferol (VITAMIN D PO) Take 1,000 Int'l Units by mouth.    Historical Provider, MD  lisinopril (PRINIVIL,ZESTRIL) 20 MG tablet Take 1 tablet (20 mg total) by mouth 2 (two) times daily. Patient taking differently: Take 20 mg by mouth daily.  03/27/15   Aleksei Plotnikov V, MD  Multiple Vitamin (MULTIVITAMIN WITH MINERALS) TABS tablet Take 1 tablet by mouth daily.    Historical Provider, MD  tamsulosin (FLOMAX) 0.4 MG CAPS capsule Take 0.4 mg by mouth daily.    Historical Provider, MD  traMADol (ULTRAM) 50 MG tablet Take 1-2 tablets (50-100 mg total) by mouth 2 (two) times daily as needed for severe pain. Patient not taking: Reported on 07/08/2015 03/27/15   Cassandria Anger, MD    Physical Exam: Filed Vitals:   12/03/15 2319 12/04/15 0259 12/04/15 0354 12/04/15 0400  BP: 164/105 145/92  160/79  Pulse: 90 66  67  Temp: 98 F (36.7 C) 98.2 F (36.8 C)    TempSrc: Oral Oral    Resp: 18 16  19   Height: 5\' 2"  (1.575 m)     Weight: 83.008 kg (183 lb)     SpO2: 99% 100% 100% 100%   General: Not in acute distress HEENT:       Eyes: PERRL, EOMI, no scleral icterus.       ENT: No discharge from the ears and nose, no pharynx injection, no tonsillar enlargement.        Neck: No JVD, no bruit, no mass felt. Heme: No neck lymph node enlargement. Cardiac: S1/S2, RRR, No murmurs, No gallops or rubs. Pulm: No rales, wheezing, rhonchi or rubs. Abd: Soft, nondistended, mild tenderness over epigastric area, no rebound pain, no organomegaly, BS present. Ext: No pitting leg edema bilaterally. 2+DP/PT pulse bilaterally. Musculoskeletal: No joint deformities, No joint redness or warmth, no limitation of ROM in spin. Skin: No rashes.   Neuro: Alert, oriented X3, cranial nerves II-XII grossly intact, moves all extremities normally. Psych: Patient is not psychotic, no suicidal or hemocidal ideation.  Labs on Admission:  Basic Metabolic Panel:  Recent Labs Lab 12/03/15 2359  NA 141  K 3.9  CL 107  CO2 22  GLUCOSE 105*  BUN 8  CREATININE 0.83  CALCIUM 9.5   Liver Function Tests: No results for input(s): AST, ALT, ALKPHOS, BILITOT, PROT, ALBUMIN in the last 168 hours. No results for input(s): LIPASE, AMYLASE in the last 168 hours. No results for input(s): AMMONIA in the last 168 hours. CBC:  Recent Labs Lab 12/03/15 2359  WBC 7.7  HGB 13.1  HCT 39.2  MCV 88.9  PLT 285   Cardiac Enzymes: No results  for input(s): CKTOTAL, CKMB, CKMBINDEX, TROPONINI in the last 168 hours.  BNP (last 3 results) No results for input(s): BNP in the last 8760 hours.  ProBNP (last 3 results) No results for input(s): PROBNP in the last 8760 hours.  CBG: No results for input(s): GLUCAP in the last 168 hours.  Radiological Exams on Admission: Dg Chest 2 View  12/04/2015  CLINICAL DATA:  Left-sided chest pain today. Shortness of breath, dizziness, tachycardia. EXAM: CHEST  2 VIEW COMPARISON:  04/03/2012 FINDINGS: The cardiomediastinal contours are normal. The lungs are clear. Pulmonary vasculature is normal. No consolidation, pleural effusion, or pneumothorax. No acute osseous abnormalities are seen. IMPRESSION: No acute pulmonary process. Electronically Signed   By: Jeb Levering M.D.   On: 12/04/2015 00:05    Assessment/Plan Principal Problem:   Chest pain Active Problems:   HLD (hyperlipidemia)   Essential hypertension   Coronary atherosclerosis   GERD   Epigastric abdominal pain   Chest pain: No pneumonia on chest x-ray. Patient does not have signs of DVT, likely to have PE. She has history of CAD with coronary artery blockage per patient. Her cardiologist did not succeed in trying to put stent for her 5 years  ago. Will admit for chest pain rule out.  - will admit to Tele bed  - cycle CE q6 x3 and repeat her EKG in the am  - Nitroglycerin, Morphine, Atenolol and aspirin - pt could not tolerate statine  - Risk factor stratification: will check FLP, UDS, TSH and A1C  - 2d echo  Epigastric abdominal pain: Etiology is not clear, likely due to acid reflux. No acute abdomen on physical examination. -Start Protonix -Check lipase -Zofran for nausea  Hypertension: -Continue home Atenolol, lisinopril -IV hydralazine when necessary  GERD: -Protonix  DVT ppx: SQ Heparin (if pt develops severe chest pain or significantly elevated trop, will be easier to switch to IV heparin or stop heparin for procedure than using Lovenox).   Code Status: Full code Family Communication: None at bed side.    Disposition Plan: Admit to inpatient   Date of Service 12/04/2015    Ivor Costa [facility] Pager [phone]  If 7PM-7AM, please contact night-coverage [email or web address] Password [facility] 12/04/2015, 4:27 AM

## 2015-12-04 NOTE — ED Provider Notes (Signed)
CSN: [id number]     Arrival date & time 12/03/15  2312 History  By signing my name below, I, [name], attest that this documentation has been prepared under the direction and in the presence of Orpah Greek, MD. Electronically Signed: Altamease [name], ED Scribe. 12/04/2015. 4:12 AM   Chief Complaint  Patient presents with  . Chest Pain   The history is provided by the patient. No language interpreter was used.   [name] is a 50 y.o. female with history of CAD, PVC, HTN, HLD, GERD, and pancreatitis who presents to the Emergency Department complaining of intermittent, 6/10 in severity, tight, left-sided neck and shoulder pain with onset yesterday. This pain is similar to discomfort that she has had in the past when her blood pressure is elevated. The pain has improved since initial onset and she is now having abdominal pain.  Associated symptoms include SOB and racing palpitations. These palpitations are similar to episodes that she had frequently prior to her last cardiac ablation in 2010. She did not take aspirin at home. Her cardiologist id Dr. Velna Hatchet in [facility] and she has an appointment in 10 days.   Past Medical History  Diagnosis Date  . Hypertension   . CAD (coronary artery disease) 2009    Dr Olegario Shearer -[facility], Heart Cath  . Hyperlipidemia   . Statin intolerance   . Acute pancreatitis 2011  . GERD (gastroesophageal reflux disease)   . Migraines   . Thyroid disease     hypothyroid, thyroid nodule  . Cancer [facility])     thyroid treated with radiation  . STD (sexually transmitted disease)     CHL 20 yrs ago  . Anemia   . PVC (premature ventricular contraction)   . Allergy    Past Surgical History  Procedure Laterality Date  . Cholecystectomy  2008  . Cardiac catheterization      unable to do cardiac cath but does stress echo 11/22/11  . Tubal ligation  1995    BTL  . Heart ablation  2006,2010    times 2, follow-up for v-tack  . Upper  gastrointestinal endoscopy     Family History  Problem Relation Age of Onset  . Hypertension Father   . Prostate cancer Father   . Heart attack Father   . Colon cancer Father 94  . Heart attack Cousin   . Diabetes Maternal Grandmother   . Hypertension Mother   . Diabetes Mother   . Cancer Sister     ovarian  . Diabetes Brother   . Esophageal cancer Neg Hx   . Rectal cancer Neg Hx   . Stomach cancer Neg Hx   . Crohn's disease Daughter    Social History  Substance Use Topics  . Smoking status: Never Smoker   . Smokeless tobacco: Never Used  . Alcohol Use: No   OB History    Gravida Para Term Preterm AB TAB SAB Ectopic Multiple Living   4 4 4       4      Review of Systems  Respiratory: Positive for shortness of breath.   Cardiovascular: Positive for palpitations.  Gastrointestinal: Positive for abdominal pain.  Musculoskeletal: Positive for neck pain.       Neck and left shoulder pain   All other systems reviewed and are negative.  Allergies  Lipitor and Lisinopril  Home Medications   Prior to Admission medications   Medication Sig Start Date End Date Taking? Authorizing Provider  atenolol (  TENORMIN) 25 MG tablet Take 25 mg by mouth daily.    Historical Provider, MD  Cholecalciferol (VITAMIN D PO) Take 1,000 Int'l Units by mouth.    Historical Provider, MD  lisinopril (PRINIVIL,ZESTRIL) 20 MG tablet Take 1 tablet (20 mg total) by mouth 2 (two) times daily. Patient taking differently: Take 20 mg by mouth daily.  03/27/15   Aleksei Plotnikov V, MD  Multiple Vitamin (MULTIVITAMIN WITH MINERALS) TABS tablet Take 1 tablet by mouth daily.    Historical Provider, MD  tamsulosin (FLOMAX) 0.4 MG CAPS capsule Take 0.4 mg by mouth daily.    Historical Provider, MD  traMADol (ULTRAM) 50 MG tablet Take 1-2 tablets (50-100 mg total) by mouth 2 (two) times daily as needed for severe pain. Patient not taking: Reported on 07/08/2015 03/27/15   Aleksei Plotnikov V, MD   BP 145/92 mmHg   Pulse 66  Temp(Src) 98.2 F (36.8 C) (Oral)  Resp 16  Ht 5\' 2"  (1.575 m)  Wt 183 lb (83.008 kg)  BMI 33.46 kg/m2  SpO2 100%  LMP 12/03/2015 Physical Exam  Constitutional: She is oriented to person, place, and time. She appears well-developed and well-nourished. No distress.  HENT:  Head: Normocephalic and atraumatic.  Right Ear: Hearing normal.  Left Ear: Hearing normal.  Nose: Nose normal.  Mouth/Throat: Oropharynx is clear and moist and mucous membranes are normal.  Eyes: Conjunctivae and EOM are normal. Pupils are equal, round, and reactive to light.  Neck: Normal range of motion. Neck supple.  Cardiovascular: Regular rhythm, S1 normal and S2 normal.  Exam reveals no gallop and no friction rub.   No murmur heard. Pulmonary/Chest: Effort normal and breath sounds normal. No respiratory distress. She exhibits no tenderness.  Abdominal: Soft. Normal appearance and bowel sounds are normal. There is no hepatosplenomegaly. There is no tenderness. There is no rebound, no guarding, no tenderness at McBurney's point and negative Murphy's sign. No hernia.  Musculoskeletal: Normal range of motion.  Neurological: She is alert and oriented to person, place, and time. She has normal strength. No cranial nerve deficit or sensory deficit. Coordination normal. GCS eye subscore is 4. GCS verbal subscore is 5. GCS motor subscore is 6.  Skin: Skin is warm, dry and intact. No rash noted. No cyanosis.  Psychiatric: She has a normal mood and affect. Her speech is normal and behavior is normal. Thought content normal.  Nursing note and vitals reviewed.   ED Course  Procedures (including critical care time) DIAGNOSTIC STUDIES: Oxygen Saturation is 100% on RA,  normal by my interpretation.    COORDINATION OF CARE: 4:10 AM Discussed treatment plan which includes lab work, CXR, EKG with pt at bedside and pt agreed to plan.  Labs Review Labs Reviewed  BASIC METABOLIC PANEL - Abnormal; Notable for the  following:    Glucose, Bld 105 (*)    All other components within normal limits  CBC  I-STAT TROPOININ, ED    Imaging Review Dg Chest 2 View  12/04/2015  CLINICAL DATA:  Left-sided chest pain today. Shortness of breath, dizziness, tachycardia. EXAM: CHEST  2 VIEW COMPARISON:  04/03/2012 FINDINGS: The cardiomediastinal contours are normal. The lungs are clear. Pulmonary vasculature is normal. No consolidation, pleural effusion, or pneumothorax. No acute osseous abnormalities are seen. IMPRESSION: No acute pulmonary process. Electronically Signed   By: Jeb Levering M.D.   On: 12/04/2015 00:05   I have personally reviewed and evaluated these images and lab results as part of my medical decision-making.  EKG Interpretation   Date/Time:  Thursday December 03 2015 23:21:49 EST Ventricular Rate:  94 PR Interval:  216 QRS Duration: 90 QT Interval:  328 QTC Calculation: 410 R Axis:   76 Text Interpretation:  Sinus rhythm with 1st degree A-V block with  Premature supraventricular complexes Nonspecific T wave abnormality  Abnormal ECG No significant change since last tracing Confirmed by POLLINA   MD, CHRISTOPHER [id number]) on 12/04/2015 3:45:43 AM      MDM   Final diagnoses:  None  Chest pain  Patient presents to the emergency department for evaluation of chest discomfort. Patient reports that she has been having intermittent episodes of left-sided chest pain that is described as a tightness. This discomfort radiates up into the left side of her neck. She has also been experiencing intermittent episodes of feeling like her heart is racing. She has a history of PVCs and an arrhythmia that required ablation in the past. Her cardiologist is Dr. Olegario Shearer in [facility]. He has told her that she has coronary artery disease. Patient reports that he attempted to do a stent in the past, but was unable to place the stent because of positioning. She is therefore medical management. Her initial EKG is  unchanged from previous. Troponin is negative 1. Will admit to medicine for cardiac rule out.  I personally performed the services described in this documentation, which was scribed in my presence. The recorded information has been reviewed and is accurate.     Orpah Greek, MD 12/04/15 [phone]

## 2015-12-04 NOTE — Interval H&P Note (Signed)
History and Physical Interval Note:  12/04/2015 3:52 PM  [name]  has presented today for surgery, with the diagnosis of unstable angina  The various methods of treatment have been discussed with the patient and family. After consideration of risks, benefits and other options for treatment, the patient has consented to  Procedure(s): Left Heart Cath and Coronary Angiography (N/A) and possible PTCA as a surgical intervention .  The patient's history has been reviewed, patient examined, no change in status, stable for surgery.  I have reviewed the patient's chart and labs.  Questions were answered to the patient's satisfaction.   Cath Lab Visit (complete for each Cath Lab visit)  Clinical Evaluation Leading to the Procedure:   ACS: Yes.    Non-ACS:    Anginal Classification: CCS IV  Anti-ischemic medical therapy: Minimal Therapy (1 class of medications)  Non-Invasive Test Results: No non-invasive testing performed  Prior CABG: No previous CABG        Adrian Prows

## 2015-12-04 NOTE — Progress Notes (Signed)
PATIENT DETAILS Name: [name] Age: 50 y.o. Sex: female Date of Birth: 1965/12/10 Admit Date: 12/04/2015 Admitting Physician Ivor Costa, MD [id number] Plotnikov, MD  Subjective: Continues to have chest pain this morning. Describes as pressure-like, associated shortness of breath. Also has some epigastric pain that is different from usual  Assessment/Plan: Principal Problem: Chest pain: With very typical features-suspect unstable angina. Have consulted cardiology (Dr. Carlyle Lipa are for cardiac catheterization today. In the meantime, continue aspirin, beta blocker-has been intolerant to statin in the past-however has been started on a trial of Crestor.  Active Problems: HLD (hyperlipidemia): Intolerant to statin in the past (severe myalgias)-started on a trial of Crestor-follow.  Essential hypertension: Controlled, continue atenolol, lisinopril. Follow.  GERD: Continue PPI. Will add GI cocktail when necessary  History of pancreatitis: Lipase within normal limits, abdominal pain likely reflux or from unstable angina. Abdomen is soft, with very mild tenderness in the epigastric area.  Disposition: Remain inpatient  Antimicrobial agents  See below  Anti-infectives    None      DVT Prophylaxis: Prophylactic  Heparin   Code Status: Full code   Family Communication None at bedside  Procedures: None  CONSULTS:  cardiology  Time spent 30 minutes-Greater than 50% of this time was spent in counseling, explanation of diagnosis, planning of further management, and coordination of care.  MEDICATIONS: Scheduled Meds: . aspirin  324 mg Oral Daily  . atenolol  25 mg Oral Daily  . cholecalciferol  1,000 Units Oral Daily  . heparin  5,000 Units Subcutaneous 3 times per day  . lisinopril  20 mg Oral Daily  . multivitamin with minerals  1 tablet Oral Daily  . pantoprazole  40 mg Oral Q1200  . rosuvastatin  20 mg Oral q1800  . tamsulosin  0.4 mg  Oral Daily   Continuous Infusions: . sodium chloride 100 mL/hr at 12/04/15 0816  . nitroGLYCERIN     PRN Meds:.acetaminophen, hydrALAZINE, HYDROmorphone (DILAUDID) injection, morphine injection, nitroGLYCERIN, ondansetron, zolpidem    PHYSICAL EXAM: Vital signs in last 24 hours: Filed Vitals:   12/04/15 0801 12/04/15 0830 12/04/15 0845 12/04/15 0900  BP: 139/89 128/86 104/81 114/82  Pulse: 59 64 67 67  Temp:      TempSrc:      Resp: 18 18 14 16   Height:      Weight:      SpO2: 100% 99% 99% 95%    Weight change:  Filed Weights   12/03/15 2319  Weight: 83.008 kg (183 lb)   Body mass index is 33.46 kg/(m^2).   Gen Exam: Awake and alert with clear speech.   Neck: Supple, No JVD.   Chest: B/L Clear.   CVS: S1 S2 Regular, no murmurs.  Abdomen: soft, BS +, non tender, non distended.  Extremities: no edema, lower extremities warm to touch. Neurologic: Non Focal.   Skin: No Rash.   Wounds: N/A.   Intake/Output from previous day: No intake or output data in the 24 hours ending 12/04/15 1002   LAB RESULTS: CBC  Recent Labs Lab 12/03/15 2359 12/04/15 0514  WBC 7.7 7.1  HGB 13.1 12.7  HCT 39.2 38.3  PLT 285 274  MCV 88.9 89.3  MCH 29.7 29.6  MCHC 33.4 33.2  RDW 14.7 14.8    Chemistries   Recent Labs Lab 12/03/15 2359 12/04/15 0514  NA 141 139  K 3.9 3.9  CL 107 108  CO2 22 24  GLUCOSE 105* 104*  BUN 8 8  CREATININE 0.83 0.90  CALCIUM 9.5 9.1    CBG: No results for input(s): GLUCAP in the last 168 hours.  GFR Estimated Creatinine Clearance: 75.6 mL/min (by C-G formula based on Cr of 0.9).  Coagulation profile  Recent Labs Lab 12/04/15 0511  INR 1.10    Cardiac Enzymes No results for input(s): CKMB, TROPONINI, MYOGLOBIN in the last 168 hours.  Invalid input(s): CK  Invalid input(s): POCBNP No results for input(s): DDIMER in the last 72 hours. No results for input(s): HGBA1C in the last 72 hours.  Recent Labs  12/04/15 0514  CHOL  212*  HDL 55  LDLCALC 139*  TRIG 89  CHOLHDL 3.9    Recent Labs  12/04/15 0514  TSH 8.712*   No results for input(s): VITAMINB12, FOLATE, FERRITIN, TIBC, IRON, RETICCTPCT in the last 72 hours.  Recent Labs  12/04/15 0511  LIPASE 30    Urine Studies No results for input(s): UHGB, CRYS in the last 72 hours.  Invalid input(s): UACOL, UAPR, USPG, UPH, UTP, UGL, UKET, UBIL, UNIT, UROB, ULEU, UEPI, UWBC, URBC, UBAC, CAST, UCOM, BILUA  MICROBIOLOGY: No results found for this or any previous visit (from the past 240 hour(s)).  RADIOLOGY STUDIES/RESULTS: Dg Chest 2 View  12/04/2015  CLINICAL DATA:  Left-sided chest pain today. Shortness of breath, dizziness, tachycardia. EXAM: CHEST  2 VIEW COMPARISON:  04/03/2012 FINDINGS: The cardiomediastinal contours are normal. The lungs are clear. Pulmonary vasculature is normal. No consolidation, pleural effusion, or pneumothorax. No acute osseous abnormalities are seen. IMPRESSION: No acute pulmonary process. Electronically Signed   By: Jeb Levering M.D.   On: 12/04/2015 00:05    Oren Binet, MD  [facility] Pager:[phone]  If 7PM-7AM, please contact night-coverage [email or web address] Password [email or web address] 12/04/2015, 10:02 AM

## 2015-12-04 NOTE — Consult Note (Signed)
CARDIOLOGY CONSULT NOTE  Patient ID: [name] MRN: [id number] DOB/AGE: 1966-08-09 50 y.o.  Admit date: 12/04/2015 Referring Physician: Internal Medicine Primary Physician:  Walker Kehr, MD Reason for Consultation: Chest pain, CAD  HPI: [name]  is a 50 y.o. female with a history of hypertension, hyperlipidemia, thyroid nodule s/p radioactive iodine ablation, frequent PVCs s/p ablation per patient report in 2008 and again in 2012, and CAD per cardiac catheterization in 2009 revealing 20-25% stenosis of the LAD at the D1 bifurcation.  She presented to the emergency department on 12/03/2015 with chest pain and shortness of breath and palpitations.    She states she has had exertional chest pain and shortness of breath over the past 2 years.  She has been seeing a cardiologist in [facility] who has been attempting to treat her medically.  She was started on isosorbide, however was unable to tolerate this.  She had also been started on Lipitor, however discontinued this due to myalgias.  She states over the past week, her symptoms have gotten notably worse.  She states she normally takes the stairs at work, however can no longer walk up a flight of stairs without developing severe chest pain and heaviness and shortness of breath.    While in the emergency department waiting room, she developed epigastric pain which persists.  She states this pain is different from the chest pain that she has been experiencing with exertion.  She denies any PND, orthopnea, edema, dizziness, syncope, or symptoms suggestive of claudication or TIA.  Past Medical History  Diagnosis Date  . Hypertension   . CAD (coronary artery disease) 2009    Dr Olegario Shearer -[facility], Heart Cath  . Hyperlipidemia   . Statin intolerance   . Acute pancreatitis 2011  . GERD (gastroesophageal reflux disease)   . Migraines   . Thyroid disease     hypothyroid, thyroid nodule  . Cancer [facility])     thyroid treated with  radiation  . STD (sexually transmitted disease)     CHL 20 yrs ago  . Anemia   . PVC (premature ventricular contraction)   . Allergy      Past Surgical History  Procedure Laterality Date  . Cholecystectomy  2008  . Cardiac catheterization      unable to do cardiac cath but does stress echo 11/22/11  . Tubal ligation  1995    BTL  . Heart ablation  2006,2010    times 2, follow-up for v-tack  . Upper gastrointestinal endoscopy       Family History  Problem Relation Age of Onset  . Hypertension Father   . Prostate cancer Father   . Heart attack Father   . Colon cancer Father 28  . Heart attack Cousin   . Diabetes Maternal Grandmother   . Hypertension Mother   . Diabetes Mother   . Cancer Sister     ovarian  . Diabetes Brother   . Esophageal cancer Neg Hx   . Rectal cancer Neg Hx   . Stomach cancer Neg Hx   . Crohn's disease Daughter      Social History: Social History   Social History  . Marital Status: Divorced    Spouse Name: N/A  . Number of Children: 4  . Years of Education: N/A   Occupational History  . 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 Smoking status: Never Smoker   .  Smokeless tobacco: Never Used  . Alcohol Use: No  . Drug Use: No  . Sexual Activity: No     Comment: BTL   Other Topics Concern  . Not on file   Social History Narrative   FAMILY HISTORY   History of hypertension   History of prostate cancer 1st degree relative <50   D, S ovar. CA      Regular exercise - YES      GYN Dr Posey Pronto - [place]      (Not in a hospital admission)   ROS: General: Reports fatigue; no fevers/chills/night sweats Eyes: no blurry vision, diplopia, or amaurosis ENT: no sore throat or hearing loss Resp: Reports SOB with exertion; no cough, wheezing, or hemoptysis CV: Reports chest pain and palpitations; no edema, PND, or orthopnea GI: Reports epigastric pain and mild nausea; no vomiting, diarrhea, or  constipation GU: no dysuria, frequency, or hematuria Skin: no rash Neuro: no headache, numbness, tingling, or weakness of extremities Musculoskeletal: no joint pain or swelling Heme: no bleeding, DVT, or easy bruising Endo: no polydipsia or polyuria    Physical Exam: Blood pressure 139/89, pulse 59, temperature 97.5 F (36.4 C), temperature source Oral, resp. rate 18, height 5\' 2"  (1.575 m), weight 83.008 kg (183 lb), last menstrual period 12/03/2015, SpO2 100 %.   General appearance: alert, cooperative, appears stated age, no distress and mildly obese Neck: no adenopathy, no carotid bruit, no JVD, supple, symmetrical, trachea midline and thyroid not enlarged, symmetric, no tenderness/mass/nodules Lungs: clear to auscultation bilaterally Chest wall: no tenderness Heart: regular rate and rhythm, S1, S2 normal, no murmur, click, rub or gallop Abdomen: mild epigastric tenderness Extremities: edema trace bilateral lower extremities Pulses: 2+ and symmetric Skin: Skin color, texture, turgor normal. No rashes or lesions Neurologic: Grossly normal  Labs:   Lab Results  Component Value Date   WBC 7.1 12/04/2015   HGB 12.7 12/04/2015   HCT 38.3 12/04/2015   MCV 89.3 12/04/2015   PLT 274 12/04/2015    Recent Labs Lab 12/04/15 0514  NA 139  K 3.9  CL 108  CO2 24  BUN 8  CREATININE 0.90  CALCIUM 9.1  GLUCOSE 104*    Lipid Panel     Component Value Date/Time   CHOL 212* 12/04/2015 0514   TRIG 89 12/04/2015 0514   HDL 55 12/04/2015 0514   CHOLHDL 3.9 12/04/2015 0514   VLDL 18 12/04/2015 0514   LDLCALC 139* 12/04/2015 0514    BNP (last 3 results) No results for input(s): BNP in the last 8760 hours.  HEMOGLOBIN A1C No results found for: HGBA1C, MPG  Cardiac Panel (last 3 results) No results for input(s): CKTOTAL, CKMB, TROPONINI, RELINDX in the last 8760 hours.  Lab Results  Component Value Date   CKTOTAL 56 02/20/2011   CKMB 0.7 02/20/2011   TROPONINI   02/20/2011    <0.30        Due to the release kinetics of cTnI, a negative result within the first hours of the onset of symptoms does not rule out myocardial infarction with certainty. If myocardial infarction is still suspected, repeat the test at appropriate intervals. **Please note change in reference range.**     TSH  Recent Labs  12/04/15 0514  TSH 8.712*    EKG 12/03/2015: sinus rhythm with 1st degree AV block at a rate of 94 bpm, normal axis, diffuse nonspecific T wave abnormality, frequent PVCs.  Echo: pending   Radiology: Dg Chest 2 View  12/04/2015  CLINICAL DATA:  Left-sided chest pain today. Shortness of breath, dizziness, tachycardia. EXAM: CHEST  2 VIEW COMPARISON:  04/03/2012 FINDINGS: The cardiomediastinal contours are normal. The lungs are clear. Pulmonary vasculature is normal. No consolidation, pleural effusion, or pneumothorax. No acute osseous abnormalities are seen. IMPRESSION: No acute pulmonary process. Electronically Signed   By: Jeb Levering M.D.   On: 12/04/2015 00:05    Scheduled Meds: . aspirin  324 mg Oral Daily  . atenolol  25 mg Oral Daily  . cholecalciferol  1,000 Units Oral Daily  . heparin  5,000 Units Subcutaneous 3 times per day  . lisinopril  20 mg Oral Daily  . multivitamin with minerals  1 tablet Oral Daily  . pantoprazole  40 mg Oral Q1200  . tamsulosin  0.4 mg Oral Daily   Continuous Infusions: . sodium chloride 100 mL/hr at 12/04/15 0816   PRN Meds:.acetaminophen, hydrALAZINE, morphine injection, nitroGLYCERIN, ondansetron, zolpidem  ASSESSMENT AND PLAN:  1. Angina Pectoris 2. CAD (Coronary angiogram in [place] 07/02/2008: 20-25% stenosis at bifurcation of LAD and D1, otherwise normal) 3. Benign essential hypertension 4. Hyperlipidemia 5. Epigastric pain 6. S/P thyroid ablation; elevated TSH  Recommendation: She presents with chest pain and shortness of breath, highly suggestive of angina pectoris, with history of  established CAD.  She has only tried Lipitor but she was unable to tolerate due to myalgias.  She is willing to try Crestor.  We'll keep NPO for now except for medications with water.  Epigastric pain consistent with GERD.  Consider cardiac catheterization for evaluation of progressive CAD.  Dr. Einar Gip to evaluate and make further recommendations.  Rachel Bo, NP-C 12/04/2015, 8:30 AM [facility] [place] Pager: [phone] Office: [phone]

## 2015-12-04 NOTE — H&P (View-Only) (Signed)
CARDIOLOGY CONSULT NOTE  Patient ID: [name] MRN: [id number] DOB/AGE: [birth year] 50 y.o.  Admit date: 12/04/2015 Referring Physician: Internal Medicine Primary Physician:  Walker Kehr, MD Reason for Consultation: Chest pain, CAD  HPI: [name]  is a 50 y.o. female with a history of hypertension, hyperlipidemia, thyroid nodule s/p radioactive iodine ablation, frequent PVCs s/p ablation per patient report in 2008 and again in 2012, and CAD per cardiac catheterization in 2009 revealing 20-25% stenosis of the LAD at the D1 bifurcation.  She presented to the emergency department on 12/03/2015 with chest pain and shortness of breath and palpitations.    She states she has had exertional chest pain and shortness of breath over the past 2 years.  She has been seeing a cardiologist in [facility] who has been attempting to treat her medically.  She was started on isosorbide, however was unable to tolerate this.  She had also been started on Lipitor, however discontinued this due to myalgias.  She states over the past week, her symptoms have gotten notably worse.  She states she normally takes the stairs at work, however can no longer walk up a flight of stairs without developing severe chest pain and heaviness and shortness of breath.    While in the emergency department waiting room, she developed epigastric pain which persists.  She states this pain is different from the chest pain that she has been experiencing with exertion.  She denies any PND, orthopnea, edema, dizziness, syncope, or symptoms suggestive of claudication or TIA.  Past Medical History  Diagnosis Date  . Hypertension   . CAD (coronary artery disease) 2009    Dr Olegario Shearer -[facility], Heart Cath  . Hyperlipidemia   . Statin intolerance   . Acute pancreatitis 2011  . GERD (gastroesophageal reflux disease)   . Migraines   . Thyroid disease     hypothyroid, thyroid nodule  . Cancer [facility])     thyroid treated with  radiation  . STD (sexually transmitted disease)     CHL 20 yrs ago  . Anemia   . PVC (premature ventricular contraction)   . Allergy      Past Surgical History  Procedure Laterality Date  . Cholecystectomy  2008  . Cardiac catheterization      unable to do cardiac cath but does stress echo 11/22/11  . Tubal ligation  1995    BTL  . Heart ablation  2006,2010    times 2, follow-up for v-tack  . Upper gastrointestinal endoscopy       Family History  Problem Relation Age of Onset  . Hypertension Father   . Prostate cancer Father   . Heart attack Father   . Colon cancer Father 49  . Heart attack Cousin   . Diabetes Maternal Grandmother   . Hypertension Mother   . Diabetes Mother   . Cancer Sister     ovarian  . Diabetes Brother   . Esophageal cancer Neg Hx   . Rectal cancer Neg Hx   . Stomach cancer Neg Hx   . Crohn's disease Daughter      Social History: Social History   Social History  . Marital Status: Divorced    Spouse Name: N/A  . Number of Children: 4  . 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 Smoking status: Never Smoker   .  Smokeless tobacco: Never Used  . Alcohol Use: No  . Drug Use: No  . Sexual Activity: No     Comment: BTL   Other Topics Concern  . Not on file   Social History Narrative   FAMILY HISTORY   History of hypertension   History of prostate cancer 1st degree relative <50   D, S ovar. CA      Regular exercise - YES      GYN Dr Posey Pronto - [place]      (Not in a hospital admission)   ROS: General: Reports fatigue; no fevers/chills/night sweats Eyes: no blurry vision, diplopia, or amaurosis ENT: no sore throat or hearing loss Resp: Reports SOB with exertion; no cough, wheezing, or hemoptysis CV: Reports chest pain and palpitations; no edema, PND, or orthopnea GI: Reports epigastric pain and mild nausea; no vomiting, diarrhea, or  constipation GU: no dysuria, frequency, or hematuria Skin: no rash Neuro: no headache, numbness, tingling, or weakness of extremities Musculoskeletal: no joint pain or swelling Heme: no bleeding, DVT, or easy bruising Endo: no polydipsia or polyuria    Physical Exam: Blood pressure 139/89, pulse 59, temperature 97.5 F (36.4 C), temperature source Oral, resp. rate 18, height 5\' 2"  (1.575 m), weight 83.008 kg (183 lb), last menstrual period 12/03/2015, SpO2 100 %.   General appearance: alert, cooperative, appears stated age, no distress and mildly obese Neck: no adenopathy, no carotid bruit, no JVD, supple, symmetrical, trachea midline and thyroid not enlarged, symmetric, no tenderness/mass/nodules Lungs: clear to auscultation bilaterally Chest wall: no tenderness Heart: regular rate and rhythm, S1, S2 normal, no murmur, click, rub or gallop Abdomen: mild epigastric tenderness Extremities: edema trace bilateral lower extremities Pulses: 2+ and symmetric Skin: Skin color, texture, turgor normal. No rashes or lesions Neurologic: Grossly normal  Labs:   Lab Results  Component Value Date   WBC 7.1 12/04/2015   HGB 12.7 12/04/2015   HCT 38.3 12/04/2015   MCV 89.3 12/04/2015   PLT 274 12/04/2015    Recent Labs Lab 12/04/15 0514  NA 139  K 3.9  CL 108  CO2 24  BUN 8  CREATININE 0.90  CALCIUM 9.1  GLUCOSE 104*    Lipid Panel     Component Value Date/Time   CHOL 212* 12/04/2015 0514   TRIG 89 12/04/2015 0514   HDL 55 12/04/2015 0514   CHOLHDL 3.9 12/04/2015 0514   VLDL 18 12/04/2015 0514   LDLCALC 139* 12/04/2015 0514    BNP (last 3 results) No results for input(s): BNP in the last 8760 hours.  HEMOGLOBIN A1C No results found for: HGBA1C, MPG  Cardiac Panel (last 3 results) No results for input(s): CKTOTAL, CKMB, TROPONINI, RELINDX in the last 8760 hours.  Lab Results  Component Value Date   CKTOTAL 56 02/20/2011   CKMB 0.7 02/20/2011   TROPONINI   02/20/2011    <0.30        Due to the release kinetics of cTnI, a negative result within the first hours of the onset of symptoms does not rule out myocardial infarction with certainty. If myocardial infarction is still suspected, repeat the test at appropriate intervals. **Please note change in reference range.**     TSH  Recent Labs  12/04/15 0514  TSH 8.712*    EKG 12/03/2015: sinus rhythm with 1st degree AV block at a rate of 94 bpm, normal axis, diffuse nonspecific T wave abnormality, frequent PVCs.  Echo: pending   Radiology: Dg Chest 2 View  12/04/2015  CLINICAL DATA:  Left-sided chest pain today. Shortness of breath, dizziness, tachycardia. EXAM: CHEST  2 VIEW COMPARISON:  04/03/2012 FINDINGS: The cardiomediastinal contours are normal. The lungs are clear. Pulmonary vasculature is normal. No consolidation, pleural effusion, or pneumothorax. No acute osseous abnormalities are seen. IMPRESSION: No acute pulmonary process. Electronically Signed   By: Jeb Levering M.D.   On: 12/04/2015 00:05    Scheduled Meds: . aspirin  324 mg Oral Daily  . atenolol  25 mg Oral Daily  . cholecalciferol  1,000 Units Oral Daily  . heparin  5,000 Units Subcutaneous 3 times per day  . lisinopril  20 mg Oral Daily  . multivitamin with minerals  1 tablet Oral Daily  . pantoprazole  40 mg Oral Q1200  . tamsulosin  0.4 mg Oral Daily   Continuous Infusions: . sodium chloride 100 mL/hr at 12/04/15 0816   PRN Meds:.acetaminophen, hydrALAZINE, morphine injection, nitroGLYCERIN, ondansetron, zolpidem  ASSESSMENT AND PLAN:  1. Angina Pectoris 2. CAD (Coronary angiogram in [place] 07/02/2008: 20-25% stenosis at bifurcation of LAD and D1, otherwise normal) 3. Benign essential hypertension 4. Hyperlipidemia 5. Epigastric pain 6. S/P thyroid ablation; elevated TSH  Recommendation: She presents with chest pain and shortness of breath, highly suggestive of angina pectoris, with history of  established CAD.  She has only tried Lipitor but she was unable to tolerate due to myalgias.  She is willing to try Crestor.  We'll keep NPO for now except for medications with water.  Epigastric pain consistent with GERD.  Consider cardiac catheterization for evaluation of progressive CAD.  Dr. Einar Gip to evaluate and make further recommendations.  Rachel Bo, NP-C 12/04/2015, 8:30 AM [facility] [place] Pager: [phone] Office: [phone]

## 2015-12-05 DIAGNOSIS — K219 Gastro-esophageal reflux disease without esophagitis: Secondary | ICD-10-CM | POA: Diagnosis not present

## 2015-12-05 DIAGNOSIS — E785 Hyperlipidemia, unspecified: Secondary | ICD-10-CM | POA: Diagnosis not present

## 2015-12-05 DIAGNOSIS — I10 Essential (primary) hypertension: Secondary | ICD-10-CM

## 2015-12-05 DIAGNOSIS — I200 Unstable angina: Secondary | ICD-10-CM

## 2015-12-05 LAB — HEMOGLOBIN A1C
HEMOGLOBIN A1C: 5.6 % (ref 4.8–5.6)
MEAN PLASMA GLUCOSE: 114 mg/dL

## 2015-12-05 MED ORDER — PRASUGREL HCL 10 MG PO TABS
10.0000 mg | ORAL_TABLET | Freq: Every day | ORAL | Status: DC
  Administered 2015-12-05 – 2015-12-08 (×3): 10 mg via ORAL
  Filled 2015-12-05 (×7): qty 1

## 2015-12-05 MED ORDER — PANTOPRAZOLE SODIUM 40 MG PO TBEC
40.0000 mg | DELAYED_RELEASE_TABLET | Freq: Every day | ORAL | Status: DC
  Administered 2015-12-05 – 2015-12-07 (×3): 40 mg via ORAL
  Filled 2015-12-05 (×4): qty 1

## 2015-12-05 MED ORDER — BUTALBITAL-APAP-CAFFEINE 50-325-40 MG PO TABS: 1.0000 | ORAL_TABLET | Freq: Four times a day (QID) | ORAL | Status: DC | PRN

## 2015-12-05 MED ORDER — GI COCKTAIL ~~LOC~~: 30.0000 mL | Freq: Three times a day (TID) | ORAL | Status: DC | PRN

## 2015-12-05 NOTE — Progress Notes (Signed)
Subjective:  Resting comfortably in bed. Abd pain has resolved. Continues to have 2/10 chest heaviness, slightly worse when ambulating, but states symptoms have improved significantly since yesterday.  Objective:  Vital Signs in the last 24 hours: Temp:  [97.5 F (36.4 C)-98.3 F (36.8 C)] 98.3 F (36.8 C) (03/11 0651) Pulse Rate:  [0-114] 88 (03/11 0651) Resp:  [5-24] 18 (03/11 0651) BP: (94-153)/(64-97) 112/69 mmHg (03/11 0651) SpO2:  [0 %-100 %] 100 % (03/11 0651) Weight:  [79.561 kg (175 lb 6.4 oz)-80.377 kg (177 lb 3.2 oz)] 79.561 kg (175 lb 6.4 oz) (03/11 0651)  Intake/Output from previous day: 03/10 0701 - 03/11 0700 In: 240 [P.O.:240] Out: 1400 [Urine:1400]  Physical Exam: General appearance: alert, cooperative, appears stated age, no distress and mildly obese Neck: no adenopathy, no carotid bruit, no JVD, supple, symmetrical, trachea midline and thyroid not enlarged, symmetric, no tenderness/mass/nodules Lungs: clear to auscultation bilaterally Chest wall: no tenderness Heart: regular rate and rhythm, S1, S2 normal, no murmur, click, rub or gallop Abdomen: mild epigastric tenderness Extremities: edema trace bilateral lower extremities Pulses: 2+ and symmetric, right radial access site asymptomatic Skin: Skin color, texture, turgor normal. No rashes or lesions Neurologic: Grossly normal  Lab Results: BMP  Recent Labs  04/01/15 1947 12/03/15 2359 12/04/15 0514  NA 139 141 139  K 4.3 3.9 3.9  CL 104 107 108  CO2 27 22 24   GLUCOSE 95 105* 104*  BUN 9 8 8   CREATININE 0.77 0.83 0.90  CALCIUM 9.5 9.5 9.1  GFRNONAA >60 >60 >60  GFRAA >60 >60 >60    CBC  Recent Labs Lab 12/04/15 0514  WBC 7.1  RBC 4.29  HGB 12.7  HCT 38.3  PLT 274  MCV 89.3  MCH 29.6  MCHC 33.2  RDW 14.8    HEMOGLOBIN A1C Lab Results  Component Value Date   HGBA1C 5.6 12/04/2015   MPG 114 12/04/2015    Cardiac Panel (last 3 results) No results for input(s): CKTOTAL, CKMB,  TROPONINI, RELINDX in the last 8760 hours.  BNP (last 3 results) No results for input(s): PROBNP in the last 8760 hours.  TSH  Recent Labs  12/04/15 0514  TSH 8.712*    CHOLESTEROL  Recent Labs  12/04/15 0514  CHOL 212*    Hepatic Function Panel  Recent Labs  04/01/15 1947  PROT 7.6  ALBUMIN 3.8  AST 19  ALT 14  ALKPHOS 45  BILITOT 0.5    Imaging: Dg Chest 2 View  12/04/2015  CLINICAL DATA:  Left-sided chest pain today. Shortness of breath, dizziness, tachycardia. EXAM: CHEST  2 VIEW COMPARISON:  04/03/2012 FINDINGS: The cardiomediastinal contours are normal. The lungs are clear. Pulmonary vasculature is normal. No consolidation, pleural effusion, or pneumothorax. No acute osseous abnormalities are seen. IMPRESSION: No acute pulmonary process. Electronically Signed   By: Jeb Levering M.D.   On: 12/04/2015 00:05    Cardiac Studies:  EKG 12/03/2015: sinus rhythm with 1st degree AV block at a rate of 94 bpm, normal axis, diffuse nonspecific T wave abnormality, frequent PVCs.  Coronary Angiogram 12/04/2015:  Mid LAD lesion, 75% stenosed just after diagonal 2 which is moderate sized. FFR 0.82.  The left ventricular systolic function is normal.  Mild disease in the RCA and circumflex coronary artery.   Assessment/Plan:  1. Angina Pectoris 2. CAD (Coronary angiogram in [place] 07/02/2008: 20-25% stenosis at bifurcation of LAD and D1, otherwise normal) 3. Benign essential hypertension 4. Hyperlipidemia 5. Epigastric pain 6. S/P  thyroid ablation; elevated TSH  Recommendation: She underwent coronary angiogram on 12/04/2015 which revealed a 75% mid LAD lesion, just after diagonal 2, which is moderate sized with FFR of 0.82. As the stenosis did not appear to be unstable plaque, medical therapy was recommended including adequate blood pressure and lipid control. Presently on Crestor, atenolol, lisinopril, and 325mg  ASA. Amlodipine added for anginal symptoms, although  she continues to have chest pain this morning. Will continue to monitor through the weekend, instructed to ambulate in the halls frequently throughout the day, and if she continues to have exertional chest pain, will scheduled for repeat cath and angioplasty on Monday.  Rachel Bo, NP-C 12/05/2015, 7:28 AM [facility] Pager: [phone] Office: [phone]

## 2015-12-05 NOTE — Progress Notes (Signed)
PATIENT DETAILS Name: [name] Age: 50 y.o. Sex: female Date of Birth: 02/20/1966 Admit Date: 12/04/2015 Admitting Physician Ivor Costa, MD [id number] Plotnikov, MD  Subjective: Continues to have exertional chest pain-that is relieved on rest.  Assessment/Plan: Principal Problem: Unstable angina: Admitted with typical chest pain, seen by cardiology, underwent cardiac catheterization which showed a 75% mid LAD lesion-per cardiology this did not appear to be unstable plaque-plans were to manage medically. However, continues to have exertional chest pain-reevaluated by cardiology today, plans are to mobilize cautiously, if continues to have chest pain then to proceed with repeat cardiac catheterization and PCI on Monday. In the meantime, continued aspirin, atenolol, lisinopril and statin. Although has been intolerant to statins in the past, seems to be tolerating Crestor pretty well.   Active Problems: HLD (hyperlipidemia): Intolerant to statin in the past (severe myalgias)-started on a trial of Crestor- which patient seems to be tolerating well.  Essential hypertension: Controlled, continue atenolol, lisinopril. Follow.  GERD: Continue PPI. Will add GI cocktail when necessary  History of pancreatitis: Lipase within normal limits, abdominal pain likely reflux or from unstable angina. Abdomen is soft,  no tenderness today.  Disposition: Remain inpatient  Antimicrobial agents  See below  Anti-infectives    None      DVT Prophylaxis: Prophylactic  Heparin   Code Status: Full code   Family Communication None at bedside  Procedures: None  CONSULTS:  cardiology  Time spent 30 minutes-Greater than 50% of this time was spent in counseling, explanation of diagnosis, planning of further management, and coordination of care.  MEDICATIONS: Scheduled Meds: . amLODipine  5 mg Oral Daily  . aspirin EC  325 mg Oral Daily  . atenolol  50 mg Oral Daily  .  cholecalciferol  1,000 Units Oral Daily  . lisinopril  20 mg Oral Daily  . pantoprazole  40 mg Oral Q1200  . prasugrel  10 mg Oral Daily  . rosuvastatin  20 mg Oral q1800  . sodium chloride flush  3 mL Intravenous Q12H   Continuous Infusions:   PRN Meds:.sodium chloride, acetaminophen, butalbital-acetaminophen-caffeine, gi cocktail, nitroGLYCERIN, ondansetron, sodium chloride flush, zolpidem    PHYSICAL EXAM: Vital signs in last 24 hours: Filed Vitals:   12/04/15 1830 12/04/15 2332 12/05/15 0651 12/05/15 1234  BP: 133/90 108/64 112/69 113/68  Pulse: 66 64 88 51  Temp:  98 F (36.7 C) 98.3 F (36.8 C) 98 F (36.7 C)  TempSrc:  Oral Oral Oral  Resp:  18 18 18   Height:      Weight:   79.561 kg (175 lb 6.4 oz)   SpO2:  98% 100% 98%    Weight change: -2.631 kg (-5 lb 12.8 oz) Filed Weights   12/03/15 2319 12/04/15 1736 12/05/15 0651  Weight: 83.008 kg (183 lb) 80.377 kg (177 lb 3.2 oz) 79.561 kg (175 lb 6.4 oz)   Body mass index is 32.07 kg/(m^2).   Gen Exam: Awake and alert with clear speech.   Neck: Supple, No JVD.   Chest: B/L Clear.   CVS: S1 S2 Regular, no murmurs.  Abdomen: soft, BS +, non tender, non distended.  Extremities: no edema, lower extremities warm to touch. Neurologic: Non Focal.   Skin: No Rash.   Wounds: N/A.   Intake/Output from previous day:  Intake/Output Summary (Last 24 hours) at 12/05/15 1415 Last data filed at 12/05/15 1300  Gross per 24 hour  Intake    700 ml  Output   1400 ml  Net   -700 ml     LAB RESULTS: CBC  Recent Labs Lab 12/03/15 2359 12/04/15 0514  WBC 7.7 7.1  HGB 13.1 12.7  HCT 39.2 38.3  PLT 285 274  MCV 88.9 89.3  MCH 29.7 29.6  MCHC 33.4 33.2  RDW 14.7 14.8    Chemistries   Recent Labs Lab 12/03/15 2359 12/04/15 0514  NA 141 139  K 3.9 3.9  CL 107 108  CO2 22 24  GLUCOSE 105* 104*  BUN 8 8  CREATININE 0.83 0.90  CALCIUM 9.5 9.1    CBG: No results for input(s): GLUCAP in the last 168  hours.  GFR Estimated Creatinine Clearance: 73.9 mL/min (by C-G formula based on Cr of 0.9).  Coagulation profile  Recent Labs Lab 12/04/15 0511  INR 1.10    Cardiac Enzymes No results for input(s): CKMB, TROPONINI, MYOGLOBIN in the last 168 hours.  Invalid input(s): CK  Invalid input(s): POCBNP No results for input(s): DDIMER in the last 72 hours.  Recent Labs  12/04/15 0515  HGBA1C 5.6    Recent Labs  12/04/15 0514  CHOL 212*  HDL 55  LDLCALC 139*  TRIG 89  CHOLHDL 3.9    Recent Labs  12/04/15 0514  TSH 8.712*   No results for input(s): VITAMINB12, FOLATE, FERRITIN, TIBC, IRON, RETICCTPCT in the last 72 hours.  Recent Labs  12/04/15 0511  LIPASE 30    Urine Studies No results for input(s): UHGB, CRYS in the last 72 hours.  Invalid input(s): UACOL, UAPR, USPG, UPH, UTP, UGL, UKET, UBIL, UNIT, UROB, ULEU, UEPI, UWBC, URBC, UBAC, CAST, UCOM, BILUA  MICROBIOLOGY: No results found for this or any previous visit (from the past 240 hour(s)).  RADIOLOGY STUDIES/RESULTS: Dg Chest 2 View  12/04/2015  CLINICAL DATA:  Left-sided chest pain today. Shortness of breath, dizziness, tachycardia. EXAM: CHEST  2 VIEW COMPARISON:  04/03/2012 FINDINGS: The cardiomediastinal contours are normal. The lungs are clear. Pulmonary vasculature is normal. No consolidation, pleural effusion, or pneumothorax. No acute osseous abnormalities are seen. IMPRESSION: No acute pulmonary process. Electronically Signed   By: Jeb Levering M.D.   On: 12/04/2015 00:05    Oren Binet, MD  [facility] Pager:[phone]  If 7PM-7AM, please contact night-coverage [email or web address] Password [email or web address] 12/05/2015, 2:15 PM

## 2015-12-06 DIAGNOSIS — I2510 Atherosclerotic heart disease of native coronary artery without angina pectoris: Secondary | ICD-10-CM | POA: Diagnosis present

## 2015-12-06 DIAGNOSIS — I25110 Atherosclerotic heart disease of native coronary artery with unstable angina pectoris: Principal | ICD-10-CM

## 2015-12-06 MED ORDER — ASPIRIN 81 MG PO CHEW
81.0000 mg | CHEWABLE_TABLET | Freq: Every day | ORAL | Status: DC
  Administered 2015-12-07 – 2015-12-08 (×2): 81 mg via ORAL
  Filled 2015-12-06 (×2): qty 1

## 2015-12-06 MED ORDER — SODIUM CHLORIDE 0.9 % WEIGHT BASED INFUSION
1.0000 mL/kg/h | INTRAVENOUS | Status: DC
  Administered 2015-12-07: 1 mL/kg/h via INTRAVENOUS

## 2015-12-06 MED ORDER — SODIUM CHLORIDE 0.9% FLUSH: 3.0000 mL | INTRAVENOUS | Status: DC | PRN

## 2015-12-06 MED ORDER — SODIUM CHLORIDE 0.9 % IV SOLN: 250.0000 mL | INTRAVENOUS | Status: DC | PRN

## 2015-12-06 MED ORDER — SODIUM CHLORIDE 0.9% FLUSH
3.0000 mL | Freq: Two times a day (BID) | INTRAVENOUS | Status: DC
  Administered 2015-12-06: 3 mL via INTRAVENOUS

## 2015-12-06 MED ORDER — SODIUM CHLORIDE 0.9 % WEIGHT BASED INFUSION
3.0000 mL/kg/h | INTRAVENOUS | Status: DC
  Administered 2015-12-07: 3 mL/kg/h via INTRAVENOUS

## 2015-12-06 NOTE — Progress Notes (Signed)
Subjective:  Resting comfortably in bed. Abd pain has resolved. Continues to have  chest heaviness with walking in the hallway and easily relieved with rest. States that she does not like to use nitroglycerin due to headache but does help with chest pain. Objective:  Vital Signs in the last 24 hours: Temp:  [98 F (36.7 C)-98.7 F (37.1 C)] 98.7 F (37.1 C) (03/12 [id number]) Pulse Rate:  [51-70] 65 (03/12 0613) Resp:  [18] 18 (03/12 0613) BP: (113-132)/(68-82) 132/82 mmHg (03/12 0613) SpO2:  [98 %-100 %] 100 % (03/12 0613) Weight:  [79.56 kg (175 lb 6.4 oz)] 79.56 kg (175 lb 6.4 oz) (03/12 [id number])  Intake/Output from previous day: 03/11 0701 - 03/12 0700 In: 800 [P.O.:800] Out: 500 [Urine:500]  Physical Exam: General appearance: alert, cooperative, appears stated age, no distress and mildly obese Neck: no adenopathy, no carotid bruit, no JVD, supple, symmetrical, trachea midline and thyroid not enlarged, symmetric, no tenderness/mass/nodules Lungs: clear to auscultation bilaterally Chest wall: no tenderness Heart: regular rate and rhythm, S1, S2 normal, no murmur, click, rub or gallop Abdomen: mild epigastric tenderness Extremities: edema trace bilateral lower extremities Pulses: 2+ and symmetric, right radial access site asymptomatic Skin: Skin color, texture, turgor normal. No rashes or lesions Neurologic: Grossly normal  Lab Results: BMP  Recent Labs  04/01/15 1947 12/03/15 2359 12/04/15 0514  NA 139 141 139  K 4.3 3.9 3.9  CL 104 107 108  CO2 27 22 24   GLUCOSE 95 105* 104*  BUN 9 8 8   CREATININE 0.77 0.83 0.90  CALCIUM 9.5 9.5 9.1  GFRNONAA >60 >60 >60  GFRAA >60 >60 >60    CBC  Recent Labs Lab 12/04/15 0514  WBC 7.1  RBC 4.29  HGB 12.7  HCT 38.3  PLT 274  MCV 89.3  MCH 29.6  MCHC 33.2  RDW 14.8   HEMOGLOBIN A1C Lab Results  Component Value Date   HGBA1C 5.6 12/04/2015   MPG 114 12/04/2015   Recent Labs  12/04/15 0514  TSH 8.712*   Recent  Labs  04/01/15 1947  PROT 7.6  ALBUMIN 3.8  AST 19  ALT 14  ALKPHOS 45  BILITOT 0.5   Cardiac Studies:  EKG 12/03/2015: sinus rhythm with 1st degree AV block at a rate of 94 bpm, normal axis, diffuse nonspecific T wave abnormality, frequent PVCs.  Coronary Angiogram 12/04/2015:  Mid LAD lesion, 75% stenosed just after diagonal 2 which is moderate sized. FFR 0.82.  The left ventricular systolic function is normal.  Mild disease in the RCA and circumflex coronary artery.  Scheduled Meds: . amLODipine  5 mg Oral Daily  . aspirin EC  325 mg Oral Daily  . atenolol  50 mg Oral Daily  . cholecalciferol  1,000 Units Oral Daily  . lisinopril  20 mg Oral Daily  . pantoprazole  40 mg Oral Q1200  . prasugrel  10 mg Oral Daily  . rosuvastatin  20 mg Oral q1800  . sodium chloride flush  3 mL Intravenous Q12H   Continuous Infusions:  PRN Meds:.sodium chloride, acetaminophen, butalbital-acetaminophen-caffeine, gi cocktail, nitroGLYCERIN, ondansetron, sodium chloride flush, zolpidem    Assessment/Plan:  1. Angina Pectoris 2. CAD (Coronary angiogram 12/04/2015 revealing mid LAD 75% stenosis) 3. Benign essential hypertension 4. Hyperlipidemia 5. Epigastric pain 6. S/P RAI thyroid ablation; elevated TSH, she will continue to f/u with her PCP for this  Recommendation: She underwent coronary angiogram on 12/04/2015 which revealed a 75% mid LAD lesion, just after diagonal 2, which  is moderate sized with FFR of 0.82. As the stenosis did not appear to be unstable plaque, medical therapy was recommended, however she continues to have class 3-4 symptoms of angina pectoris, walking the hallway reproduces chest tightness which is easily treated with rest. Hence the LAD stenosis is significant and probably unstable, hence we'll proceed with PCI tomorrow.  Adrian Prows, MD.  12/06/2015, 10:28 AM [facility] Pager: [phone] Office: [phone]

## 2015-12-06 NOTE — Progress Notes (Signed)
Spoke with Dr. Gardiner Fanti will resume primary service. Hospitalist service will sign off.

## 2015-12-07 ENCOUNTER — Encounter (HOSPITAL_COMMUNITY)
Admission: EM | Disposition: A | Discharge status: Home / Self Care | Payer: Self-pay | Source: Home / Self Care | Attending: Internal Medicine

## 2015-12-07 ENCOUNTER — Encounter (HOSPITAL_COMMUNITY): Payer: Self-pay | Admitting: Cardiology

## 2015-12-07 DIAGNOSIS — I25110 Atherosclerotic heart disease of native coronary artery with unstable angina pectoris: Secondary | ICD-10-CM | POA: Diagnosis not present

## 2015-12-07 DIAGNOSIS — Z85850 Personal history of malignant neoplasm of thyroid: Secondary | ICD-10-CM | POA: Diagnosis not present

## 2015-12-07 DIAGNOSIS — Z923 Personal history of irradiation: Secondary | ICD-10-CM | POA: Diagnosis not present

## 2015-12-07 DIAGNOSIS — E785 Hyperlipidemia, unspecified: Secondary | ICD-10-CM | POA: Diagnosis not present

## 2015-12-07 DIAGNOSIS — Z955 Presence of coronary angioplasty implant and graft: Secondary | ICD-10-CM | POA: Insufficient documentation

## 2015-12-07 HISTORY — PX: CARDIAC CATHETERIZATION: SHX172

## 2015-12-07 LAB — POCT I-STAT, CHEM 8
BUN: 12 mg/dL (ref 6–20)
CALCIUM ION: 1.2 mmol/L (ref 1.12–1.23)
CHLORIDE: 104 mmol/L (ref 101–111)
CREATININE: 0.7 mg/dL (ref 0.44–1.00)
GLUCOSE: 98 mg/dL (ref 65–99)
HCT: 36 % (ref 36.0–46.0)
Hemoglobin: 12.2 g/dL (ref 12.0–15.0)
Potassium: 4.1 mmol/L (ref 3.5–5.1)
SODIUM: 142 mmol/L (ref 135–145)
TCO2: 26 mmol/L (ref 0–100)

## 2015-12-07 SURGERY — CORONARY STENT INTERVENTION

## 2015-12-07 MED ORDER — HEPARIN (PORCINE) IN NACL 2-0.9 UNIT/ML-% IJ SOLN
INTRAMUSCULAR | Status: AC
  Filled 2015-12-07: qty 1000

## 2015-12-07 MED ORDER — VERAPAMIL HCL 2.5 MG/ML IV SOLN
INTRA_ARTERIAL | Status: DC | PRN
  Administered 2015-12-07: 15 mL via INTRA_ARTERIAL

## 2015-12-07 MED ORDER — BIVALIRUDIN 250 MG IV SOLR
INTRAVENOUS | Status: AC
  Filled 2015-12-07: qty 250

## 2015-12-07 MED ORDER — SODIUM CHLORIDE 0.9% FLUSH: 3.0000 mL | INTRAVENOUS | Status: DC | PRN

## 2015-12-07 MED ORDER — BIVALIRUDIN BOLUS VIA INFUSION - CUPID
INTRAVENOUS | Status: DC | PRN
  Administered 2015-12-07: 60.225 mg via INTRAVENOUS

## 2015-12-07 MED ORDER — MIDAZOLAM HCL 2 MG/2ML IJ SOLN
INTRAMUSCULAR | Status: AC
  Filled 2015-12-07: qty 2

## 2015-12-07 MED ORDER — PRASUGREL HCL 10 MG PO TABS
ORAL_TABLET | ORAL | Status: DC | PRN
  Administered 2015-12-07: 60 mg via ORAL

## 2015-12-07 MED ORDER — LIDOCAINE HCL (PF) 1 % IJ SOLN
INTRAMUSCULAR | Status: AC
  Filled 2015-12-07: qty 30

## 2015-12-07 MED ORDER — SODIUM CHLORIDE 0.9 % WEIGHT BASED INFUSION
3.0000 mL/kg/h | INTRAVENOUS | Status: AC
  Administered 2015-12-07: 3 mL/kg/h via INTRAVENOUS

## 2015-12-07 MED ORDER — SODIUM CHLORIDE 0.9 % IV SOLN: 250.0000 mL | INTRAVENOUS | Status: DC | PRN

## 2015-12-07 MED ORDER — SODIUM CHLORIDE 0.9% FLUSH
3.0000 mL | Freq: Two times a day (BID) | INTRAVENOUS | Status: DC
  Administered 2015-12-07: 3 mL via INTRAVENOUS

## 2015-12-07 MED ORDER — ANGIOPLASTY BOOK
Freq: Once | Status: DC
  Filled 2015-12-07: qty 1

## 2015-12-07 MED ORDER — NITROGLYCERIN 1 MG/10 ML FOR IR/CATH LAB
INTRA_ARTERIAL | Status: AC
  Filled 2015-12-07: qty 10

## 2015-12-07 MED ORDER — NITROGLYCERIN 1 MG/10 ML FOR IR/CATH LAB
INTRA_ARTERIAL | Status: DC | PRN
  Administered 2015-12-07 (×2): 200 ug

## 2015-12-07 MED ORDER — LIDOCAINE HCL (PF) 1 % IJ SOLN
INTRAMUSCULAR | Status: DC | PRN
  Administered 2015-12-07: 2 mL

## 2015-12-07 MED ORDER — HEPARIN SODIUM (PORCINE) 1000 UNIT/ML IJ SOLN
INTRAMUSCULAR | Status: AC
  Filled 2015-12-07: qty 1

## 2015-12-07 MED ORDER — SODIUM CHLORIDE 0.9 % IV SOLN
250.0000 mg | INTRAVENOUS | Status: DC | PRN
  Administered 2015-12-07: 1.75 mg/kg/h via INTRAVENOUS

## 2015-12-07 MED ORDER — PRASUGREL HCL 10 MG PO TABS
ORAL_TABLET | ORAL | Status: AC
  Filled 2015-12-07: qty 6

## 2015-12-07 MED ORDER — IOHEXOL 350 MG/ML SOLN
INTRAVENOUS | Status: DC | PRN
  Administered 2015-12-07: 70 mL via INTRAVENOUS

## 2015-12-07 MED ORDER — VERAPAMIL HCL 2.5 MG/ML IV SOLN
INTRAVENOUS | Status: AC
  Filled 2015-12-07: qty 2

## 2015-12-07 MED ORDER — HEPARIN (PORCINE) IN NACL 2-0.9 UNIT/ML-% IJ SOLN
INTRAMUSCULAR | Status: DC | PRN
  Administered 2015-12-07: 1000 mL

## 2015-12-07 MED ORDER — MIDAZOLAM HCL 2 MG/2ML IJ SOLN
INTRAMUSCULAR | Status: DC | PRN
  Administered 2015-12-07: 2 mg via INTRAVENOUS

## 2015-12-07 MED ORDER — HYDROMORPHONE HCL 1 MG/ML IJ SOLN
INTRAMUSCULAR | Status: DC | PRN
  Administered 2015-12-07: 0.5 mg via INTRAVENOUS

## 2015-12-07 MED ORDER — HYDROMORPHONE HCL 1 MG/ML IJ SOLN
INTRAMUSCULAR | Status: AC
  Filled 2015-12-07: qty 1

## 2015-12-07 SURGICAL SUPPLY — 14 items
BALLN ~~LOC~~ TREK RX 3.0X8 (BALLOONS) ×3
BALLOON ~~LOC~~ TREK RX 3.0X8 (BALLOONS) ×1 IMPLANT
CATH VISTA GUIDE 6FR XBLAD3.0 (CATHETERS) ×3 IMPLANT
DEVICE RAD COMP TR BAND LRG (VASCULAR PRODUCTS) ×3 IMPLANT
GLIDESHEATH SLEND A-KIT 6F 20G (SHEATH) ×3 IMPLANT
KIT ENCORE 26 ADVANTAGE (KITS) ×3 IMPLANT
KIT HEART LEFT (KITS) ×3 IMPLANT
PACK CARDIAC CATHETERIZATION (CUSTOM PROCEDURE TRAY) ×3 IMPLANT
STENT RESOLUTE INTEG 3.0X15 (Permanent Stent) ×3 IMPLANT
TRANSDUCER W/STOPCOCK (MISCELLANEOUS) ×3 IMPLANT
TUBING CIL FLEX 10 FLL-RA (TUBING) ×3 IMPLANT
WIRE COUGAR XT STRL 190CM (WIRE) ×3 IMPLANT
WIRE HI TORQ VERSACORE-J 145CM (WIRE) ×3 IMPLANT
WIRE SAFE-T 1.5MM-J .035X260CM (WIRE) ×3 IMPLANT

## 2015-12-07 NOTE — Progress Notes (Signed)
TR BAND REMOVAL  LOCATION:    right radial  DEFLATED PER PROTOCOL:    Yes.    TIME BAND OFF / DRESSING APPLIED:    1330   SITE UPON ARRIVAL:    Level 0  SITE AFTER BAND REMOVAL:    Level 0  CIRCULATION SENSATION AND MOVEMENT:    Within Normal Limits   Yes.    COMMENTS:   Patient tolerated well. No hematoma noted. No bleeding noted. Slight bruising noted. Dressing applied C/D/I. Educated on limitations as well. Teach back performed.

## 2015-12-07 NOTE — Progress Notes (Signed)
[id number] Cath note stated pt potentially home today so came to educate and refer to CRP 2 [place]. Discussed with pt importance of effient with stent. Discussed risk factors, NTG use, ex ed and heart healthy diet. Gave [place] brochure and discussed. Will refer. With pt's work schedule, she may only be able to come to 1 or 2 classes a week. Will follow up tomorrow for ambulation if not discharged. Graylon Good RN BSN 12/07/2015 2:21 PM

## 2015-12-07 NOTE — Interval H&P Note (Signed)
History and Physical Interval Note:  12/07/2015 8:17 AM  [name]  has presented today for surgery, with the diagnosis of cad  The various methods of treatment have been discussed with the patient and family. After consideration of risks, benefits and other options for treatment, the patient has consented to  Procedure(s): Coronary Stent Intervention (N/A) as a surgical intervention .  The patient's history has been reviewed, patient examined, no change in status, stable for surgery.  I have reviewed the patient's chart and labs.  Questions were answered to the patient's satisfaction.   Cath Lab Visit (complete for each Cath Lab visit)  Clinical Evaluation Leading to the Procedure:   ACS: Yes.    Non-ACS:    Anginal Classification: CCS IV  Anti-ischemic medical therapy: Maximal Therapy (2 or more classes of medications)  Non-Invasive Test Results: No non-invasive testing performed  Prior CABG: No previous CABG        Adrian Prows

## 2015-12-07 NOTE — Care Management Note (Signed)
Case Management Note  Patient Details  Name: ALIYYAH TRENKAMP MRN: [id number] Date of Birth: 04/21/1966  Subjective/Objective:    Patient is from home, pta indep, on effient, co pay is $55, NCM gave patient effient 30 day savings card and 12 month free savings card.  Patient will go to [facility] outpatient pharmacy to get the 30 day free.                  Action/Plan:   Expected Discharge Date:                  Expected Discharge Plan:  Home/Self Care  In-House Referral:     Discharge planning Services  CM Consult  Post Acute Care Choice:    Choice offered to:     DME Arranged:    DME Agency:     HH Arranged:    [place] Agency:     Status of Service:  Completed, signed off  Medicare Important Message Given:    Date Medicare IM Given:    Medicare IM give by:    Date Additional Medicare IM Given:    Additional Medicare Important Message give by:     If discussed at [place] of Stay Meetings, dates discussed:    Additional Comments:  Zenon Mayo, RN 12/07/2015, 2:21 PM

## 2015-12-07 NOTE — H&P (View-Only) (Signed)
Subjective:  Resting comfortably in bed. Abd pain has resolved. Continues to have  chest heaviness with walking in the hallway and easily relieved with rest. States that she does not like to use nitroglycerin due to headache but does help with chest pain. Objective:  Vital Signs in the last 24 hours: Temp:  [98 F (36.7 C)-98.7 F (37.1 C)] 98.7 F (37.1 C) (03/12 [id number]) Pulse Rate:  [51-70] 65 (03/12 0613) Resp:  [18] 18 (03/12 0613) BP: (113-132)/(68-82) 132/82 mmHg (03/12 0613) SpO2:  [98 %-100 %] 100 % (03/12 0613) Weight:  [79.56 kg (175 lb 6.4 oz)] 79.56 kg (175 lb 6.4 oz) (03/12 [id number])  Intake/Output from previous day: 03/11 0701 - 03/12 0700 In: 800 [P.O.:800] Out: 500 [Urine:500]  Physical Exam: General appearance: alert, cooperative, appears stated age, no distress and mildly obese Neck: no adenopathy, no carotid bruit, no JVD, supple, symmetrical, trachea midline and thyroid not enlarged, symmetric, no tenderness/mass/nodules Lungs: clear to auscultation bilaterally Chest wall: no tenderness Heart: regular rate and rhythm, S1, S2 normal, no murmur, click, rub or gallop Abdomen: mild epigastric tenderness Extremities: edema trace bilateral lower extremities Pulses: 2+ and symmetric, right radial access site asymptomatic Skin: Skin color, texture, turgor normal. No rashes or lesions Neurologic: Grossly normal  Lab Results: BMP  Recent Labs  04/01/15 1947 12/03/15 2359 12/04/15 0514  NA 139 141 139  K 4.3 3.9 3.9  CL 104 107 108  CO2 27 22 24   GLUCOSE 95 105* 104*  BUN 9 8 8   CREATININE 0.77 0.83 0.90  CALCIUM 9.5 9.5 9.1  GFRNONAA >60 >60 >60  GFRAA >60 >60 >60    CBC  Recent Labs Lab 12/04/15 0514  WBC 7.1  RBC 4.29  HGB 12.7  HCT 38.3  PLT 274  MCV 89.3  MCH 29.6  MCHC 33.2  RDW 14.8   HEMOGLOBIN A1C Lab Results  Component Value Date   HGBA1C 5.6 12/04/2015   MPG 114 12/04/2015   Recent Labs  12/04/15 0514  TSH 8.712*   Recent  Labs  04/01/15 1947  PROT 7.6  ALBUMIN 3.8  AST 19  ALT 14  ALKPHOS 45  BILITOT 0.5   Cardiac Studies:  EKG 12/03/2015: sinus rhythm with 1st degree AV block at a rate of 94 bpm, normal axis, diffuse nonspecific T wave abnormality, frequent PVCs.  Coronary Angiogram 12/04/2015:  Mid LAD lesion, 75% stenosed just after diagonal 2 which is moderate sized. FFR 0.82.  The left ventricular systolic function is normal.  Mild disease in the RCA and circumflex coronary artery.  Scheduled Meds: . amLODipine  5 mg Oral Daily  . aspirin EC  325 mg Oral Daily  . atenolol  50 mg Oral Daily  . cholecalciferol  1,000 Units Oral Daily  . lisinopril  20 mg Oral Daily  . pantoprazole  40 mg Oral Q1200  . prasugrel  10 mg Oral Daily  . rosuvastatin  20 mg Oral q1800  . sodium chloride flush  3 mL Intravenous Q12H   Continuous Infusions:  PRN Meds:.sodium chloride, acetaminophen, butalbital-acetaminophen-caffeine, gi cocktail, nitroGLYCERIN, ondansetron, sodium chloride flush, zolpidem    Assessment/Plan:  1. Angina Pectoris 2. CAD (Coronary angiogram 12/04/2015 revealing mid LAD 75% stenosis) 3. Benign essential hypertension 4. Hyperlipidemia 5. Epigastric pain 6. S/P RAI thyroid ablation; elevated TSH, she will continue to f/u with her PCP for this  Recommendation: She underwent coronary angiogram on 12/04/2015 which revealed a 75% mid LAD lesion, just after diagonal 2, which  is moderate sized with FFR of 0.82. As the stenosis did not appear to be unstable plaque, medical therapy was recommended, however she continues to have class 3-4 symptoms of angina pectoris, walking the hallway reproduces chest tightness which is easily treated with rest. Hence the LAD stenosis is significant and probably unstable, hence we'll proceed with PCI tomorrow.  Adrian Prows, MD.  12/06/2015, 10:28 AM [facility] Pager: [phone] Office: [phone]

## 2015-12-07 NOTE — Progress Notes (Signed)
Patient arrived to floor at approx 0945, A&O by four, unlabored breathing noted. Right radial band in place, level 0, no bleeding or hematoma noted. Right wrist is a little puffy. Stent card given to patient is on bedside table. Patient's brother is at bedside. Assisted patient with breakfast tray at this time. No s/s of distress noted or complaints voiced at this time. Call bell is in reach and bed is in lowest position.

## 2015-12-08 DIAGNOSIS — Z923 Personal history of irradiation: Secondary | ICD-10-CM | POA: Diagnosis not present

## 2015-12-08 DIAGNOSIS — I25110 Atherosclerotic heart disease of native coronary artery with unstable angina pectoris: Secondary | ICD-10-CM | POA: Diagnosis not present

## 2015-12-08 DIAGNOSIS — Z85850 Personal history of malignant neoplasm of thyroid: Secondary | ICD-10-CM | POA: Diagnosis not present

## 2015-12-08 DIAGNOSIS — E785 Hyperlipidemia, unspecified: Secondary | ICD-10-CM | POA: Diagnosis not present

## 2015-12-08 LAB — BASIC METABOLIC PANEL
Anion gap: 8 (ref 5–15)
BUN: 12 mg/dL (ref 6–20)
CHLORIDE: 108 mmol/L (ref 101–111)
CO2: 23 mmol/L (ref 22–32)
CREATININE: 0.95 mg/dL (ref 0.44–1.00)
Calcium: 8.9 mg/dL (ref 8.9–10.3)
GFR calc Af Amer: >60 mL/min (ref >60)
GFR calc non Af Amer: >60 mL/min (ref >60)
GLUCOSE: 92 mg/dL (ref 65–99)
POTASSIUM: 4.1 mmol/L (ref 3.5–5.1)
SODIUM: 139 mmol/L (ref 135–145)

## 2015-12-08 LAB — CBC
HEMATOCRIT: 34.9 % — AB (ref 36.0–46.0)
Hemoglobin: 11.2 g/dL — ABNORMAL LOW (ref 12.0–15.0)
MCH: 29 pg (ref 26.0–34.0)
MCHC: 32.1 g/dL (ref 30.0–36.0)
MCV: 90.4 fL (ref 78.0–100.0)
Platelets: 258 10*3/uL (ref 150–400)
RBC: 3.86 MIL/uL — ABNORMAL LOW (ref 3.87–5.11)
RDW: 14.7 % (ref 11.5–15.5)
WBC: 7.3 10*3/uL (ref 4.0–10.5)

## 2015-12-08 LAB — POCT ACTIVATED CLOTTING TIME: Activated Clotting Time: 368 seconds

## 2015-12-08 MED ORDER — ROSUVASTATIN CALCIUM 20 MG PO TABS: 20.0000 mg | ORAL_TABLET | Freq: Every day | ORAL | Status: DC

## 2015-12-08 MED ORDER — AMLODIPINE BESYLATE 5 MG PO TABS: 5.0000 mg | ORAL_TABLET | Freq: Every day | ORAL | Status: DC

## 2015-12-08 MED ORDER — DOCUSATE SODIUM 100 MG PO CAPS
100.0000 mg | ORAL_CAPSULE | Freq: Two times a day (BID) | ORAL | Status: DC | PRN
Start: 2015-12-08 — End: 2015-12-08

## 2015-12-08 MED ORDER — DOCUSATE SODIUM 100 MG PO CAPS: 100.0000 mg | ORAL_CAPSULE | Freq: Two times a day (BID) | ORAL | Status: DC | PRN

## 2015-12-08 MED ORDER — PRASUGREL HCL 10 MG PO TABS: 10.0000 mg | ORAL_TABLET | Freq: Every day | ORAL | Status: DC

## 2015-12-08 MED ORDER — PANTOPRAZOLE SODIUM 40 MG PO TBEC: 40.0000 mg | DELAYED_RELEASE_TABLET | Freq: Every day | ORAL | Status: DC

## 2015-12-08 MED ORDER — PANTOPRAZOLE SODIUM 40 MG PO TBEC
40.0000 mg | DELAYED_RELEASE_TABLET | Freq: Every day | ORAL | Status: DC
Start: 2015-12-08 — End: 2015-12-08

## 2015-12-08 MED ORDER — ASPIRIN 81 MG PO CHEW: 81.0000 mg | CHEWABLE_TABLET | Freq: Every day | ORAL | Status: DC

## 2015-12-08 MED ORDER — NITROGLYCERIN 0.4 MG SL SUBL: 0.4000 mg | SUBLINGUAL_TABLET | SUBLINGUAL | Status: DC | PRN

## 2015-12-08 MED FILL — ROSUVASTATIN CALCIUM 20 MG: 20 | 30 days supply | Qty: 30 | Fill #0

## 2015-12-08 MED FILL — ASPIR-LOW EC 81 MG TABLET: 81 | 30 days supply | Qty: 30 | Fill #0

## 2015-12-08 MED FILL — AMLODIPINE BESYLATE 5 MG TA: 5 | 30 days supply | Qty: 30 | Fill #0

## 2015-12-08 MED FILL — EFFIENT 10 MG TABLET: 10 | 30 days supply | Qty: 30 | Fill #0

## 2015-12-08 MED FILL — NITROGLYCERIN 0.4 MG TAB SL: 0.4 | 13 days supply | Qty: 25 | Fill #0

## 2015-12-08 MED FILL — PANTOPRAZOLE SOD DR 40 MG T: 40 | 30 days supply | Qty: 30 | Fill #0

## 2015-12-08 NOTE — Discharge Summary (Signed)
Physician Discharge Summary  Patient ID: [name] MRN: [id number] DOB/AGE: 50-02-1966 50 y.o.  Admit date: 12/04/2015 Discharge date: 12/08/2015  Primary Discharge Diagnosis: CAD s/p successful PTCA and stenting of the mid LAD with 3.0 x 15 mm resolute DES.   Secondary Discharge Diagnosis: 1. Angina Pectoris 2. Benign essential hypertension 3. Hyperlipidemia 4. Epigastric pain 5. S/P RAI thyroid ablation; elevated TSH, she will continue to f/u with her PCP for this  Significant Diagnostic Studies: Coronary Angiogram 12/04/2015:  Mid LAD lesion, 75% stenosed just after diagonal 2 which is moderate sized. FFR 0.82. The left ventricular systolic function is normal. Mild disease in the RCA and circumflex coronary artery.  Coronary Angiogram 12/07/2015: Mid LAD stenosis now appears to be at least 80-90%.   Hospital Course: [name] is a 50 y.o. female with history of hypertension, hyperlipidemia, known coronary artery disease by catheterization in the past and was told to have mid LAD stenosis which was complex hence was recommended medical therapy. She had not started her statins as she had severe myalgias on atorvastatin. She was admitted with symptoms suggestive of unstable angina pectoris without any EKG abnormalities and she was ruled out for myocardial infarction, however due to symptoms suggestive of unstable angina pectoris and was greatly taken to the cardiac catheterization lab to evaluate her coronary anatomy.  Coronary Angiogram on 12/04/2015 revealed mid LAD lesion, 75% stenosed just after diagonal 2 which is moderate sized with FFR 0.82. As plaque was stable, medical therapy was recommended, however, throughout the weekend, she continued to have persistent angina with even minimal activity, therefore she was brought back to the cath lab on 12/07/2015 and underwent successful PTCA and stenting of the mid LAD with 3.0 x 15 mm resolute DES.   Recommendations on discharge:  Continue aggressive medical therapy, follow up outpatient in 2 weeks for reevaluation.   Discharge Exam: Blood pressure 150/84, pulse 73, temperature 97.8 F (36.6 C), temperature source Oral, resp. rate 17, height 5\' 2"  (1.575 m), weight 81.2 kg (179 lb 0.2 oz), last menstrual period 12/03/2015, SpO2 99 %.    General appearance: alert, cooperative, appears stated age, no distress and mildly obese Neck: no adenopathy, no carotid bruit, no JVD, supple, symmetrical, trachea midline and thyroid not enlarged, symmetric, no tenderness/mass/nodules Lungs: clear to auscultation bilaterally Chest wall: no tenderness Heart: regular rate and rhythm, S1, S2 normal, no murmur, click, rub or gallop Abdomen: mild epigastric tenderness Extremities: edema trace bilateral lower extremities Pulses: 2+ and symmetric, right radial access site asymptomatic Skin: Skin color, texture, turgor normal. No rashes or lesions Neurologic: Grossly normal  Labs:   Lab Results  Component Value Date   WBC 7.3 12/08/2015   HGB 11.2* 12/08/2015   HCT 34.9* 12/08/2015   MCV 90.4 12/08/2015   PLT 258 12/08/2015    Recent Labs Lab 12/08/15 0420  NA 139  K 4.1  CL 108  CO2 23  BUN 12  CREATININE 0.95  CALCIUM 8.9  GLUCOSE 92    Lipid Panel     Component Value Date/Time   CHOL 212* 12/04/2015 0514   TRIG 89 12/04/2015 0514   HDL 55 12/04/2015 0514   CHOLHDL 3.9 12/04/2015 0514   VLDL 18 12/04/2015 0514   LDLCALC 139* 12/04/2015 0514    BNP (last 3 results) No results for input(s): BNP in the last 8760 hours.  HEMOGLOBIN A1C Lab Results  Component Value Date   HGBA1C 5.6 12/04/2015   MPG 114 12/04/2015  Cardiac Panel (last 3 results) No results for input(s): CKTOTAL, CKMB, TROPONINI, RELINDX in the last 8760 hours.  Lab Results  Component Value Date   CKTOTAL 56 02/20/2011   CKMB 0.7 02/20/2011   TROPONINI  02/20/2011    <0.30        Due to the release kinetics of cTnI, a negative  result within the first hours of the onset of symptoms does not rule out myocardial infarction with certainty. If myocardial infarction is still suspected, repeat the test at appropriate intervals. **Please note change in reference range.**     TSH  Recent Labs  12/04/15 0514  TSH 8.712*    EKG 12/08/2015: sinus rhythm bradycardia with 1st degree AV block at a rate of 54 bpm, normal axis, diffuse nonspecific T wave abnormality.   Radiology: Dg Chest 2 View  12/04/2015  CLINICAL DATA:  Left-sided chest pain today. Shortness of breath, dizziness, tachycardia. EXAM: CHEST  2 VIEW COMPARISON:  04/03/2012 FINDINGS: The cardiomediastinal contours are normal. The lungs are clear. Pulmonary vasculature is normal. No consolidation, pleural effusion, or pneumothorax. No acute osseous abnormalities are seen. IMPRESSION: No acute pulmonary process. Electronically Signed   By: Jeb Levering M.D.   On: 12/04/2015 00:05      FOLLOW UP PLANS AND APPOINTMENTS Discharge Instructions    AMB Referral to Cardiac Rehabilitation - Phase II    Complete by:  As directed   Diagnosis:  PCI     Amb Referral to Cardiac Rehabilitation    Complete by:  As directed   Diagnosis:  PCI     Discharge patient    Complete by:  As directed             Medication List    TAKE these medications        amLODipine 5 MG tablet  Commonly known as:  NORVASC  Take 1 tablet (5 mg total) by mouth daily.     aspirin 81 MG chewable tablet  Chew 1 tablet (81 mg total) by mouth daily.     atenolol 25 MG tablet  Commonly known as:  TENORMIN  Take 25 mg by mouth daily.     docusate sodium 100 MG capsule  Commonly known as:  COLACE  Take 1 capsule (100 mg total) by mouth 2 (two) times daily as needed for mild constipation.     lisinopril 40 MG tablet  Commonly known as:  PRINIVIL,ZESTRIL  Take 40 mg by mouth daily.     nitroGLYCERIN 0.4 MG SL tablet  Commonly known as:  NITROSTAT  Place 1 tablet (0.4 mg  total) under the tongue every 5 (five) minutes as needed for chest pain.     pantoprazole 40 MG tablet  Commonly known as:  PROTONIX  Take 1 tablet (40 mg total) by mouth daily at 12 noon.     prasugrel 10 MG Tabs tablet  Commonly known as:  EFFIENT  Take 1 tablet (10 mg total) by mouth daily.     rosuvastatin 20 MG tablet  Commonly known as:  CRESTOR  Take 1 tablet (20 mg total) by mouth daily at 6 PM.           Follow-up Information    Follow up with Rachel Bo, NP In 2 weeks.   Specialty:  Nurse Practitioner   Why:  Call for appointment   Contact information:   [address] [place] [address] [phone]        Christabella Alvira  Elmyra Ricks, NP-C 12/08/2015, 8:49 AM [facility] Pager: [phone] Office: [phone]

## 2015-12-08 NOTE — Discharge Instructions (Signed)
Coronary Angiogram With Stent, Care After Refer to this sheet in the next few weeks. These instructions provide you with information about caring for yourself after your procedure. Your health care provider may also give you more specific instructions. Your treatment has been planned according to current medical practices, but problems sometimes occur. Call your health care provider if you have any problems or questions after your procedure. WHAT TO EXPECT AFTER THE PROCEDURE  After your procedure, it is typical to have the following:  Bruising at the catheter insertion site that usually fades within 1-2 weeks.  Blood collecting in the tissue (hematoma) that may be painful to the touch. It should usually decrease in size and tenderness within 1-2 weeks. HOME CARE INSTRUCTIONS  Take medicines only as directed by your health care provider. Blood thinners may be prescribed after your procedure to improve blood flow through the stent.  You may shower 24-48 hours after the procedure or as directed by your health care provider. Remove the bandage (dressing) and gently wash the catheter insertion site with plain soap and water. Pat the area dry with a clean towel. Do not rub the site, because this may cause bleeding.  Do not take baths, swim, or use a hot tub until your health care provider approves.  Check your catheter insertion site every day for redness, swelling, or drainage.  Do not apply powder or lotion to the site.  Do not lift over 10 lb (4.5 kg) for 5 days after your procedure or as directed by your health care provider.  Ask your health care provider when it is okay to:  Return to work or school.  Resume usual physical activities or sports.  Resume sexual activity.  Eat a heart-healthy diet. This should include plenty of fresh fruits and vegetables. Meat should be lean cuts. Avoid the following types of food:  Food that is high in salt.  Canned or highly processed food.  Food  that is high in saturated fat or sugar.  Fried food.  Make any other lifestyle changes as recommended by your health care provider. These may include:  Not using any tobacco products, including cigarettes, chewing tobacco, or electronic cigarettes.If you need help quitting, ask your health care provider.  Managing your weight.  Getting regular exercise.  Managing your blood pressure.  Limiting your alcohol intake.  Managing other health problems, such as diabetes.  If you need an MRI after your heart stent has been placed, be sure to tell the health care provider who orders the MRI that you have a heart stent.  Keep all follow-up visits as directed by your health care provider. This is important. SEEK MEDICAL CARE IF:  You have a fever.  You have chills.  You have increased bleeding from the catheter insertion site. Hold pressure on the site. SEEK IMMEDIATE MEDICAL CARE IF:  You develop chest pain or shortness of breath, feel faint, or pass out.  You have unusual pain at the catheter insertion site.  You have redness, warmth, or swelling at the catheter insertion site.  You have drainage (other than a small amount of blood on the dressing) from the catheter insertion site.  The catheter insertion site is bleeding, and the bleeding does not stop after 30 minutes of holding steady pressure on the site.  You develop bleeding from any other place, such as from your rectum. There may be bright red blood in your urine or stool, or it may appear as black, tarry stool.  This information is not intended to replace advice given to you by your health care provider. Make sure you discuss any questions you have with your health care provider.   Document Released: 04/01/2005 Document Revised: 10/03/2014 Document Reviewed: 02/04/2013 Elsevier Interactive Patient Education 2016 Elsevier Inc.  Fat and Cholesterol Restricted Diet High levels of fat and cholesterol in your blood may  lead to various health problems, such as diseases of the heart, blood vessels, gallbladder, liver, and pancreas. Fats are concentrated sources of energy that come in various forms. Certain types of fat, including saturated fat, may be harmful in excess. Cholesterol is a substance needed by your body in small amounts. Your body makes all the cholesterol it needs. Excess cholesterol comes from the food you eat. When you have high levels of cholesterol and saturated fat in your blood, health problems can develop because the excess fat and cholesterol will gather along the walls of your blood vessels, causing them to narrow. Choosing the right foods will help you control your intake of fat and cholesterol. This will help keep the levels of these substances in your blood within normal limits and reduce your risk of disease. WHAT IS MY PLAN? Your health care provider recommends that you:  Get no more than __________ % of the total calories in your daily diet from fat.  Limit your intake of saturated fat to less than ______% of your total calories each day.  Limit the amount of cholesterol in your diet to less than _________mg per day. WHAT TYPES OF FAT SHOULD I CHOOSE?  Choose healthy fats more often. Choose monounsaturated and polyunsaturated fats, such as olive and canola oil, flaxseeds, walnuts, almonds, and seeds.  Eat more omega-3 fats. Good choices include salmon, mackerel, sardines, tuna, flaxseed oil, and ground flaxseeds. Aim to eat fish at least two times a week.  Limit saturated fats. Saturated fats are primarily found in animal products, such as meats, butter, and cream. Plant sources of saturated fats include palm oil, palm kernel oil, and coconut oil.  Avoid foods with partially hydrogenated oils in them. These contain trans fats. Examples of foods that contain trans fats are stick margarine, some tub margarines, cookies, crackers, and other baked goods. WHAT GENERAL GUIDELINES DO I NEED  TO FOLLOW? These guidelines for healthy eating will help you control your intake of fat and cholesterol:  Check food labels carefully to identify foods with trans fats or high amounts of saturated fat.  Fill one half of your plate with vegetables and green salads.  Fill one fourth of your plate with whole grains. Look for the word "whole" as the first word in the ingredient list.  Fill one fourth of your plate with lean protein foods.  Limit fruit to two servings a day. Choose fruit instead of juice.  Eat more foods that contain soluble fiber. Examples of foods that contain this type of fiber are apples, broccoli, carrots, beans, peas, and barley. Aim to get 20-30 g of fiber per day.  Eat more home-cooked food and less restaurant, buffet, and fast food.  Limit or avoid alcohol.  Limit foods high in starch and sugar.  Limit fried foods.  Cook foods using methods other than frying. Baking, boiling, grilling, and broiling are all great options.  Lose weight if you are overweight. Losing just 5-10% of your initial body weight can help your overall health and prevent diseases such as diabetes and heart disease. WHAT FOODS CAN I EAT? Grains Whole grains, such  as whole wheat or whole grain breads, crackers, cereals, and pasta. Unsweetened oatmeal, bulgur, barley, quinoa, or brown rice. Corn or whole wheat flour tortillas. Vegetables Fresh or frozen vegetables (raw, steamed, roasted, or grilled). Green salads. Fruits All fresh, canned (in natural juice), or frozen fruits. Meat and Other Protein Products Ground beef (85% or leaner), grass-fed beef, or beef trimmed of fat. Skinless chicken or [place]. Ground chicken or [place]. Pork trimmed of fat. All fish and seafood. Eggs. Dried beans, peas, or lentils. Unsalted nuts or seeds. Unsalted canned or dry beans. Dairy Low-fat dairy products, such as skim or 1% milk, 2% or reduced-fat cheeses, low-fat ricotta or cottage cheese, or plain low-fat  yogurt. Fats and Oils Tub margarines without trans fats. Light or reduced-fat mayonnaise and salad dressings. Avocado. Olive, canola, sesame, or safflower oils. Natural peanut or almond butter (choose ones without added sugar and oil). The items listed above may not be a complete list of recommended foods or beverages. Contact your dietitian for more options. WHAT FOODS ARE NOT RECOMMENDED? Grains White bread. White pasta. White rice. Cornbread. Bagels, pastries, and croissants. Crackers that contain trans fat. Vegetables White potatoes. Corn. Creamed or fried vegetables. Vegetables in a cheese sauce. Fruits Dried fruits. Canned fruit in light or heavy syrup. Fruit juice. Meat and Other Protein Products Fatty cuts of meat. Ribs, chicken wings, bacon, sausage, bologna, salami, chitterlings, fatback, hot dogs, bratwurst, and packaged luncheon meats. Liver and organ meats. Dairy Whole or 2% milk, cream, half-and-half, and cream cheese. Whole milk cheeses. Whole-fat or sweetened yogurt. Full-fat cheeses. Nondairy creamers and whipped toppings. Processed cheese, cheese spreads, or cheese curds. Sweets and Desserts Corn syrup, sugars, honey, and molasses. Candy. Jam and jelly. Syrup. Sweetened cereals. Cookies, pies, cakes, donuts, muffins, and ice cream. Fats and Oils Butter, stick margarine, lard, shortening, ghee, or bacon fat. Coconut, palm kernel, or palm oils. Beverages Alcohol. Sweetened drinks (such as sodas, lemonade, and fruit drinks or punches). The items listed above may not be a complete list of foods and beverages to avoid. Contact your dietitian for more information.   This information is not intended to replace advice given to you by your health care provider. Make sure you discuss any questions you have with your health care provider.   Document Released: 09/12/2005 Document Revised: 10/03/2014 Document Reviewed: 12/11/2013 Elsevier Interactive Patient Education 2016 [place] DASH stands for "Dietary Approaches to Stop Hypertension." The DASH eating plan is a healthy eating plan that has been shown to reduce high blood pressure (hypertension). Additional health benefits may include reducing the risk of type 2 diabetes mellitus, heart disease, and stroke. The DASH eating plan may also help with weight loss. WHAT DO I NEED TO KNOW ABOUT THE DASH EATING PLAN? For the DASH eating plan, you will follow these general guidelines:  Choose foods with a percent daily value for sodium of less than 5% (as listed on the food label).  Use salt-free seasonings or herbs instead of table salt or sea salt.  Check with your health care provider or pharmacist before using salt substitutes.  Eat lower-sodium products, often labeled as "lower sodium" or "no salt added."  Eat fresh foods.  Eat more vegetables, fruits, and low-fat dairy products.  Choose whole grains. Look for the word "whole" as the first word in the ingredient list.  Choose fish and skinless chicken or [place] more often than red meat. Limit fish, poultry, and meat to 6 oz (170 g)  each day.  Limit sweets, desserts, sugars, and sugary drinks.  Choose heart-healthy fats.  Limit cheese to 1 oz (28 g) per day.  Eat more home-cooked food and less restaurant, buffet, and fast food.  Limit fried foods.  Cook foods using methods other than frying.  Limit canned vegetables. If you do use them, rinse them well to decrease the sodium.  When eating at a restaurant, ask that your food be prepared with less salt, or no salt if possible. WHAT FOODS CAN I EAT? Seek help from a dietitian for individual calorie needs. Grains Whole grain or whole wheat bread. Brown rice. Whole grain or whole wheat pasta. Quinoa, bulgur, and whole grain cereals. Low-sodium cereals. Corn or whole wheat flour tortillas. Whole grain cornbread. Whole grain crackers. Low-sodium crackers. Vegetables Fresh or frozen  vegetables (raw, steamed, roasted, or grilled). Low-sodium or reduced-sodium tomato and vegetable juices. Low-sodium or reduced-sodium tomato sauce and paste. Low-sodium or reduced-sodium canned vegetables.  Fruits All fresh, canned (in natural juice), or frozen fruits. Meat and Other Protein Products Ground beef (85% or leaner), grass-fed beef, or beef trimmed of fat. Skinless chicken or [place]. Ground chicken or [place]. Pork trimmed of fat. All fish and seafood. Eggs. Dried beans, peas, or lentils. Unsalted nuts and seeds. Unsalted canned beans. Dairy Low-fat dairy products, such as skim or 1% milk, 2% or reduced-fat cheeses, low-fat ricotta or cottage cheese, or plain low-fat yogurt. Low-sodium or reduced-sodium cheeses. Fats and Oils Tub margarines without trans fats. Light or reduced-fat mayonnaise and salad dressings (reduced sodium). Avocado. Safflower, olive, or canola oils. Natural peanut or almond butter. Other Unsalted popcorn and pretzels. The items listed above may not be a complete list of recommended foods or beverages. Contact your dietitian for more options. WHAT FOODS ARE NOT RECOMMENDED? Grains White bread. White pasta. White rice. Refined cornbread. Bagels and croissants. Crackers that contain trans fat. Vegetables Creamed or fried vegetables. Vegetables in a cheese sauce. Regular canned vegetables. Regular canned tomato sauce and paste. Regular tomato and vegetable juices. Fruits Dried fruits. Canned fruit in light or heavy syrup. Fruit juice. Meat and Other Protein Products Fatty cuts of meat. Ribs, chicken wings, bacon, sausage, bologna, salami, chitterlings, fatback, hot dogs, bratwurst, and packaged luncheon meats. Salted nuts and seeds. Canned beans with salt. Dairy Whole or 2% milk, cream, half-and-half, and cream cheese. Whole-fat or sweetened yogurt. Full-fat cheeses or blue cheese. Nondairy creamers and whipped toppings. Processed cheese, cheese spreads, or cheese  curds. Condiments Onion and garlic salt, seasoned salt, table salt, and sea salt. Canned and packaged gravies. Worcestershire sauce. Tartar sauce. Barbecue sauce. Teriyaki sauce. Soy sauce, including reduced sodium. Steak sauce. Fish sauce. Oyster sauce. Cocktail sauce. Horseradish. Ketchup and mustard. Meat flavorings and tenderizers. Bouillon cubes. Hot sauce. Tabasco sauce. Marinades. Taco seasonings. Relishes. Fats and Oils Butter, stick margarine, lard, shortening, ghee, and bacon fat. Coconut, palm kernel, or palm oils. Regular salad dressings. Other Pickles and olives. Salted popcorn and pretzels. The items listed above may not be a complete list of foods and beverages to avoid. Contact your dietitian for more information. WHERE CAN I FIND MORE INFORMATION? National Heart, Lung, and Blood Institute: travelstabloid.com   This information is not intended to replace advice given to you by your health care provider. Make sure you discuss any questions you have with your health care provider.   Document Released: 09/01/2011 Document Revised: 10/03/2014 Document Reviewed: 07/17/2013 Elsevier Interactive Patient Education [employer].

## 2015-12-15 ENCOUNTER — Telehealth: Payer: Self-pay | Admitting: Endocrinology

## 2015-12-15 NOTE — Telephone Encounter (Signed)
Pt said she was in the hospital and wants Dr. Loanne Drilling to look at her blood test results and see if she needs to make an appt with him or not

## 2015-12-15 NOTE — Telephone Encounter (Signed)
I contacted the pt and advised of note below. Pt scheduled with [place] for 12/24/2015 at 8:30 am.

## 2015-12-15 NOTE — Telephone Encounter (Signed)
See note below and please advise, Thanks! 

## 2015-12-15 NOTE — Telephone Encounter (Signed)
Your thyroid was a little off. You should have f/u of this with me or Dr Plotnikov--your choice

## 2015-12-15 NOTE — Telephone Encounter (Signed)
Requested a call back from the pt to discuss.  

## 2015-12-17 ENCOUNTER — Encounter: Payer: Self-pay | Admitting: Internal Medicine

## 2015-12-17 ENCOUNTER — Ambulatory Visit (INDEPENDENT_AMBULATORY_CARE_PROVIDER_SITE_OTHER): Payer: Commercial Managed Care - PPO | Admitting: Internal Medicine

## 2015-12-17 VITALS — BP 110/90 | HR 70 | Temp 97.6°F | Resp 16 | Ht 62.0 in | Wt 181.0 lb

## 2015-12-17 DIAGNOSIS — E785 Hyperlipidemia, unspecified: Secondary | ICD-10-CM | POA: Diagnosis not present

## 2015-12-17 DIAGNOSIS — I2510 Atherosclerotic heart disease of native coronary artery without angina pectoris: Secondary | ICD-10-CM

## 2015-12-17 MED ORDER — PITAVASTATIN CALCIUM 2 MG PO TABS: 1.0000 | ORAL_TABLET | Freq: Every day | ORAL | Status: DC

## 2015-12-17 NOTE — Patient Instructions (Signed)

## 2015-12-17 NOTE — Progress Notes (Signed)
Pre visit review using our clinic review tool, if applicable. No additional management support is needed unless otherwise documented below in the visit note. (sarah self, [employer] student  documented this record)  

## 2015-12-20 NOTE — Progress Notes (Signed)
Subjective:  Patient ID: [name], female    DOB: 08-04-1966  Age: 50 y.o. MRN: [id number]  CC: Hypertension; Hyperlipidemia; and Coronary Artery Disease   HPI [name] presents for the complaint that Crestor has caused low back pain. She has coronary artery disease and is status post recent cardiac stenting. She was told to start taking a statin so she has been taking Crestor. A few days after taking it because of low back pain. She previously took Lipitor and said it caused muscle aches. She denies chest pain, shortness of breath, diaphoresis, or edema. She says the back pain as an intermittent achy sensation that does not radiate into her legs. She is also concerned about a scab on her right forearm. The cardiac cath was done through her right radial artery and she has a scab at the site. She also has a small scab on the dorsum of the right wrist that she doesn't know what caused it.  Outpatient Prescriptions Prior to Visit  Medication Sig Dispense Refill  . amLODipine (NORVASC) 5 MG tablet Take 1 tablet (5 mg total) by mouth daily. 30 tablet 1  . aspirin 81 MG chewable tablet Chew 1 tablet (81 mg total) by mouth daily. 30 tablet 1  . atenolol (TENORMIN) 25 MG tablet Take 25 mg by mouth daily.    Marland Kitchen docusate sodium (COLACE) 100 MG capsule Take 1 capsule (100 mg total) by mouth 2 (two) times daily as needed for mild constipation. 10 capsule 0  . lisinopril (PRINIVIL,ZESTRIL) 40 MG tablet Take 40 mg by mouth daily.    . nitroGLYCERIN (NITROSTAT) 0.4 MG SL tablet Place 1 tablet (0.4 mg total) under the tongue every 5 (five) minutes as needed for chest pain. 25 tablet 1  . pantoprazole (PROTONIX) 40 MG tablet Take 1 tablet (40 mg total) by mouth daily at 12 noon. 30 tablet 0  . prasugrel (EFFIENT) 10 MG TABS tablet Take 1 tablet (10 mg total) by mouth daily. 30 tablet 1  . rosuvastatin (CRESTOR) 20 MG tablet Take 1 tablet (20 mg total) by mouth daily at 6 PM. 30 tablet 1   No  facility-administered medications prior to visit.    ROS Review of Systems  Constitutional: Negative.   HENT: Negative.   Eyes: Negative.   Respiratory: Negative.  Negative for cough, choking, shortness of breath and stridor.   Cardiovascular: Negative.  Negative for chest pain, palpitations and leg swelling.  Gastrointestinal: Negative.  Negative for nausea, vomiting, abdominal pain, diarrhea, constipation and blood in stool.  Endocrine: Negative.   Genitourinary: Negative.   Musculoskeletal: Positive for back pain. Negative for myalgias, joint swelling, arthralgias and neck pain.  Skin: Negative.  Negative for color change and rash.  Allergic/Immunologic: Negative.   Neurological: Negative.  Negative for dizziness, weakness, light-headedness, numbness and headaches.  Hematological: Negative.  Negative for adenopathy. Does not bruise/bleed easily.  Psychiatric/Behavioral: Negative.     Objective:  BP 110/90 mmHg  Pulse 70  Temp(Src) 97.6 F (36.4 C) (Oral)  Resp 16  Ht 5\' 2"  (1.575 m)  Wt 181 lb (82.101 kg)  BMI 33.10 kg/m2  SpO2 97%  LMP 12/03/2015  BP Readings from Last 3 Encounters:  12/17/15 110/90  12/08/15 150/84  07/22/15 132/77    Wt Readings from Last 3 Encounters:  12/17/15 181 lb (82.101 kg)  12/08/15 179 lb 0.2 oz (81.2 kg)  07/22/15 181 lb (82.101 kg)    Physical Exam  Constitutional: She is oriented  to person, place, and time. She appears well-developed and well-nourished.  Non-toxic appearance. She does not have a sickly appearance. She does not appear ill. No distress.  HENT:  Head: Normocephalic and atraumatic.  Mouth/Throat: Oropharynx is clear and moist. No oropharyngeal exudate.  Eyes: Conjunctivae are normal. Right eye exhibits no discharge. Left eye exhibits no discharge. No scleral icterus.  Neck: Normal range of motion. Neck supple. No JVD present. No tracheal deviation present. No thyromegaly present.  Cardiovascular: Normal rate, regular  rhythm, normal heart sounds and intact distal pulses.  Exam reveals no gallop and no friction rub.   No murmur heard. Pulmonary/Chest: Effort normal and breath sounds normal. No stridor. No respiratory distress. She has no wheezes. She has no rales. She exhibits no tenderness.  Abdominal: Soft. Bowel sounds are normal. She exhibits no distension and no mass. There is no tenderness. There is no rebound and no guarding.  Musculoskeletal: Normal range of motion. She exhibits no edema.       Arms: Lymphadenopathy:    She has no cervical adenopathy.  Neurological: She is oriented to person, place, and time.  Skin: Skin is warm and dry. No rash noted. She is not diaphoretic. No erythema. No pallor.  Vitals reviewed.   Lab Results  Component Value Date   WBC 7.3 12/08/2015   HGB 11.2* 12/08/2015   HCT 34.9* 12/08/2015   PLT 258 12/08/2015   GLUCOSE 92 12/08/2015   CHOL 212* 12/04/2015   TRIG 89 12/04/2015   HDL 55 12/04/2015   LDLCALC 139* 12/04/2015   ALT 14 04/01/2015   AST 19 04/01/2015   NA 139 12/08/2015   K 4.1 12/08/2015   CL 108 12/08/2015   CREATININE 0.95 12/08/2015   BUN 12 12/08/2015   CO2 23 12/08/2015   TSH 8.712* 12/04/2015   INR 1.10 12/04/2015   HGBA1C 5.6 12/04/2015    Dg Chest 2 View  12/04/2015  CLINICAL DATA:  Left-sided chest pain today. Shortness of breath, dizziness, tachycardia. EXAM: CHEST  2 VIEW COMPARISON:  04/03/2012 FINDINGS: The cardiomediastinal contours are normal. The lungs are clear. Pulmonary vasculature is normal. No consolidation, pleural effusion, or pneumothorax. No acute osseous abnormalities are seen. IMPRESSION: No acute pulmonary process. Electronically Signed   By: Jeb Levering M.D.   On: 12/04/2015 00:05    Assessment & Plan:   [name] was seen today for hypertension, hyperlipidemia and coronary artery disease.  Diagnoses and all orders for this visit:  Coronary artery disease involving native coronary artery of native heart  without angina pectoris- will try Livalo for risk reduction -     Pitavastatin Calcium (LIVALO) 2 MG TABS; Take 1 tablet (2 mg total) by mouth daily.  Hyperlipidemia with target LDL less than 70- will change to Livalo for its better SE profile -     Pitavastatin Calcium (LIVALO) 2 MG TABS; Take 1 tablet (2 mg total) by mouth daily.   I have discontinued Ms. Ruark's rosuvastatin. I am also having her start on Pitavastatin Calcium. Additionally, I am having her maintain her atenolol, lisinopril, amLODipine, aspirin, docusate sodium, nitroGLYCERIN, pantoprazole, and prasugrel.  Meds ordered this encounter  Medications  . Pitavastatin Calcium (LIVALO) 2 MG TABS    Sig: Take 1 tablet (2 mg total) by mouth daily.    Dispense:  30 tablet    Refill:  11     Follow-up: Return in about 3 months (around 03/18/2016).  Scarlette Calico, MD

## 2015-12-21 ENCOUNTER — Telehealth: Payer: Self-pay | Admitting: *Deleted

## 2015-12-21 ENCOUNTER — Telehealth: Payer: Self-pay

## 2015-12-21 DIAGNOSIS — E785 Hyperlipidemia, unspecified: Secondary | ICD-10-CM

## 2015-12-21 DIAGNOSIS — I2510 Atherosclerotic heart disease of native coronary artery without angina pectoris: Secondary | ICD-10-CM

## 2015-12-21 MED ORDER — PITAVASTATIN CALCIUM 2 MG PO TABS: 1.0000 | ORAL_TABLET | Freq: Every day | ORAL | Status: DC

## 2015-12-21 NOTE — Telephone Encounter (Signed)
Received call pt states Dr. Ronnald Ramp was suppose to send Livalo to her pharmacy @ [facility]. Inform pt MD sent rx to [facility] she states she no longeruse [facility], and told him she uses [facility]. Inform pt will resend to correct pharmactyy...[name]

## 2015-12-21 NOTE — Telephone Encounter (Signed)
PA initiated via CoverMyMeds key [id number]

## 2015-12-22 NOTE — Telephone Encounter (Signed)
PA submission on hold pending pt's return call with updated insurance information

## 2015-12-22 NOTE — Telephone Encounter (Signed)
PA Approved through 12/21/2016, pt advised of same

## 2015-12-27 NOTE — Patient Instructions (Addendum)
i have sent a prescription to your pharmacy, for a thyroid hormone pill.   Let's also check a 24-HR urine test, to look for a cause of your symptoms.   Please recheck the blood test in 1 month.   I would be happy to see you back here as necessary.

## 2015-12-27 NOTE — Progress Notes (Signed)
Subjective:    Patient ID: [name], female    DOB: June 01, 1966, 50 y.o.   MRN: [id number]  HPI Pt returns for f/u of post-I-131 hypothyroidism (in 2010, pt had i-131 rx for hyperthyroidism; she did not have [place], but nuc med scan showed diffuse uptake; her TFT normalized, so she has never been on synthroid; f/u US in 2014 was normal).  She has slight palpitations in the chest, and assoc excessive diaphoresis.   Past Medical History  Diagnosis Date  . Hypertension   . CAD (coronary artery disease) 2009    Dr Olegario Shearer -[facility], Heart Cath  . Hyperlipidemia   . Statin intolerance   . Acute pancreatitis 2011  . GERD (gastroesophageal reflux disease)   . Thyroid nodule 2010    "precancerous; thyroid treated with radiation"  . STD (sexually transmitted disease)     CHL 20 yrs ago  . Anemia   . PVC (premature ventricular contraction)   . Allergy   . Family history of adverse reaction to anesthesia     "mother and brother have difficulty waking up out from it"  . Hypothyroidism     "recently dx'd" (12/07/2015)    Past Surgical History  Procedure Laterality Date  . Ventricular ablation surgery  2006,2010    times 2, follow-up for v-tack  . Upper gastrointestinal endoscopy    . Cardiac catheterization      unable to do cardiac cath but does stress echo 11/22/11  . Cardiac catheterization N/A 12/04/2015    Procedure: Left Heart Cath and Coronary Angiography;  Surgeon: Adrian Prows, MD;  Location: [facility];  Service: Cardiovascular;  Laterality: N/A;  . Cardiac catheterization N/A 12/04/2015    Procedure: Intravascular Pressure Wire/FFR Study;  Surgeon: Adrian Prows, MD;  Location: [facility];  Service: Cardiovascular;  Laterality: N/A;  . Cardiac catheterization N/A 12/07/2015    Procedure: Coronary Stent Intervention;  Surgeon: Adrian Prows, MD;  Location: [facility];  Service: Cardiovascular;  Laterality: N/A;  . Laparoscopic cholecystectomy  2008  . Tubal  ligation Bilateral 1995  . Eye muscle surgery Left 1969    Social History   Social History  . Marital Status: Divorced    Spouse Name: N/A  . Number of Children: 4  . Years of Education: N/A   Occupational History  . DISPATCHER [employer]  . Student     F/T   Social History Main Topics  . Smoking status: Never Smoker   . Smokeless tobacco: Never Used  . Alcohol Use: No  . Drug Use: No  . Sexual Activity:    Partners: Male    Birth Control/ Protection: Surgical, Abstinence     Comment: BTL   Other Topics Concern  . Not on file   Social History Narrative   FAMILY HISTORY   History of hypertension   History of prostate cancer 1st degree relative <50   D, S ovar. CA      Regular exercise - YES      GYN Dr Posey Pronto - [facility]    Current Outpatient Prescriptions on File Prior to Visit  Medication Sig Dispense Refill  . amLODipine (NORVASC) 5 MG tablet Take 1 tablet (5 mg total) by mouth daily. 30 tablet 1  . aspirin 81 MG chewable tablet Chew 1 tablet (81 mg total) by mouth daily. 30 tablet 1  . atenolol (TENORMIN) 25 MG tablet Take 25 mg by mouth daily.    Marland Kitchen  docusate sodium (COLACE) 100 MG capsule Take 1 capsule (100 mg total) by mouth 2 (two) times daily as needed for mild constipation. (Patient not taking: Reported on 12/28/2015) 10 capsule 0  . lisinopril (PRINIVIL,ZESTRIL) 40 MG tablet Take 40 mg by mouth daily.    . nitroGLYCERIN (NITROSTAT) 0.4 MG SL tablet Place 1 tablet (0.4 mg total) under the tongue every 5 (five) minutes as needed for chest pain. 25 tablet 1  . pantoprazole (PROTONIX) 40 MG tablet Take 1 tablet (40 mg total) by mouth daily at 12 noon. 30 tablet 0  . Pitavastatin Calcium (LIVALO) 2 MG TABS Take 1 tablet (2 mg total) by mouth daily. 30 tablet 11  . prasugrel (EFFIENT) 10 MG TABS tablet Take 1 tablet (10 mg total) by mouth daily. 30 tablet 1   No current facility-administered medications on file prior to visit.    Allergies  Allergen  Reactions  . Crestor [Rosuvastatin Calcium] Other (See Comments)    LBP  . Lipitor [Atorvastatin] Other (See Comments)    Muscle aches and pains    Family History  Problem Relation Age of Onset  . Hypertension Father   . Prostate cancer Father   . Heart attack Father   . Colon cancer Father 77  . Heart attack Cousin   . Diabetes Maternal Grandmother   . Hypertension Mother   . Diabetes Mother   . Cancer Sister     ovarian  . Diabetes Brother   . Esophageal cancer Neg Hx   . Rectal cancer Neg Hx   . Stomach cancer Neg Hx   . Crohn's disease Daughter     BP 122/72 mmHg  Pulse 64  Temp(Src) 98.5 F (36.9 C)  Resp 12  Ht 5\' 2"  (1.575 m)  Wt 179 lb 12.8 oz (81.557 kg)  BMI 32.88 kg/m2  SpO2 99%  LMP 12/03/2015  Review of Systems She has insomnia and heat intolerance.      Objective:   Physical Exam VITAL SIGNS:  See vs page GENERAL: no distress NECK: There is no palpable thyroid enlargement.  No thyroid nodule is palpable.  No palpable lymphadenopathy at the anterior neck.  Lab Results  Component Value Date   TSH 8.712* 12/04/2015      Assessment & Plan:  Hypothyroidism: new, mild. Palpitations, and other sxs, not thyroid-related. Goiter: better in f/u US in 2014: she does not need f/u US now.    Patient is advised the following: Patient Instructions  i have sent a prescription to your pharmacy, for a thyroid hormone pill.   Let's also check a 24-HR urine test, to look for a cause of your symptoms.   Please recheck the blood test in 1 month.   I would be happy to see you back here as necessary.

## 2015-12-28 ENCOUNTER — Ambulatory Visit (INDEPENDENT_AMBULATORY_CARE_PROVIDER_SITE_OTHER): Payer: Commercial Managed Care - PPO | Admitting: Endocrinology

## 2015-12-28 ENCOUNTER — Encounter: Payer: Self-pay | Admitting: Endocrinology

## 2015-12-28 VITALS — BP 122/72 | HR 64 | Temp 98.5°F | Resp 12 | Ht 62.0 in | Wt 179.8 lb

## 2015-12-28 DIAGNOSIS — R002 Palpitations: Secondary | ICD-10-CM | POA: Insufficient documentation

## 2015-12-28 LAB — TSH: TSH: 5.11 u[IU]/mL — ABNORMAL HIGH (ref 0.35–4.50)

## 2015-12-28 MED ORDER — LEVOTHYROXINE SODIUM 25 MCG PO TABS: 25.0000 ug | ORAL_TABLET | Freq: Every day | ORAL | Status: DC

## 2015-12-30 ENCOUNTER — Telehealth: Payer: Self-pay | Admitting: Endocrinology

## 2015-12-30 NOTE — Telephone Encounter (Signed)
Left a vm advising pt to call back to discuss meds.

## 2015-12-30 NOTE — Telephone Encounter (Signed)
Pt asking for call back regarding the meds

## 2015-12-30 NOTE — Addendum Note (Signed)
Addended by: Kaylyn Lim I on: 12/30/2015 01:35 PM   Modules accepted: Orders

## 2016-01-03 LAB — CATECHOLAMINES, FRACTIONATED, URINE, 24 HOUR
CALCULATED TOTAL (E+ NE): 28 ug/(24.h) (ref 26–121)
CREATININE, URINE MG/DAY-CATEUR: 1.4 g/(24.h) (ref 0.63–2.50)
DOPAMINE, 24 HR URINE: 249 ug/(24.h) (ref 52–480)
Norepinephrine, 24 hr Ur: 28 mcg/24 h (ref 15–100)
TOTAL VOLUME - CF 24HR U: 1040 mL

## 2016-01-05 LAB — METANEPHRINES, URINE, 24 HOUR
METANEPH TOTAL UR: 294 ug/(24.h) (ref 182–739)
Metanephrines, Ur: 73 mcg/24 h (ref 58–203)
NORMETANEPHRINE 24H UR: 221 ug/(24.h) (ref 88–649)

## 2016-01-22 ENCOUNTER — Encounter: Payer: Self-pay | Admitting: Internal Medicine

## 2016-01-27 ENCOUNTER — Other Ambulatory Visit: Payer: Commercial Managed Care - PPO

## 2016-02-17 ENCOUNTER — Ambulatory Visit (INDEPENDENT_AMBULATORY_CARE_PROVIDER_SITE_OTHER): Payer: Commercial Managed Care - PPO | Admitting: Family

## 2016-02-17 VITALS — BP 130/82 | HR 67 | Temp 98.0°F | Resp 16 | Wt 182.0 lb

## 2016-02-17 DIAGNOSIS — L03116 Cellulitis of left lower limb: Secondary | ICD-10-CM | POA: Diagnosis not present

## 2016-02-17 MED ORDER — SULFAMETHOXAZOLE-TRIMETHOPRIM 800-160 MG PO TABS: 1.0000 | ORAL_TABLET | Freq: Two times a day (BID) | ORAL | Status: DC

## 2016-02-17 NOTE — Progress Notes (Signed)
Pre visit review using our clinic review tool, if applicable. No additional management support is needed unless otherwise documented below in the visit note. 

## 2016-02-17 NOTE — Progress Notes (Signed)
Subjective:    Patient ID: [name], female    DOB: 22-Jul-1966, 50 y.o.   MRN: [id number]   [name] is a 50 y.o. female who presents today for an acute visit.    HPI Comments: Patient here for evaluation of left ankle spider bite a week ago, gradually worsening. Fever last night, resolved. Hasn't tried any medications on it. Describes as red, tender, burning. No drainage or itching. Denies fever, chills, unusual joint pain, enlarged lymph nodes.  No h/o MRSA. Works in hospital setting.   Currently in cardiac rehab after stent 12/2015.   Past Medical History  Diagnosis Date  . Hypertension   . CAD (coronary artery disease) 2009    Dr Olegario Shearer -[facility], Heart Cath  . Hyperlipidemia   . Statin intolerance   . Acute pancreatitis 2011  . GERD (gastroesophageal reflux disease)   . Thyroid nodule 2010    "precancerous; thyroid treated with radiation"  . STD (sexually transmitted disease)     CHL 20 yrs ago  . Anemia   . PVC (premature ventricular contraction)   . Allergy   . Family history of adverse reaction to anesthesia     "mother and brother have difficulty waking up out from it"  . Hypothyroidism     "recently dx'd" (12/07/2015)   Allergies: Crestor and Lipitor Current Outpatient Prescriptions on File Prior to Visit  Medication Sig Dispense Refill  . amLODipine (NORVASC) 5 MG tablet Take 1 tablet (5 mg total) by mouth daily. 30 tablet 1  . aspirin 81 MG chewable tablet Chew 1 tablet (81 mg total) by mouth daily. 30 tablet 1  . atenolol (TENORMIN) 25 MG tablet Take 25 mg by mouth daily.    . cholecalciferol (VITAMIN D) 1000 units tablet Take 1,000 Units by mouth daily.    Marland Kitchen docusate sodium (COLACE) 100 MG capsule Take 1 capsule (100 mg total) by mouth 2 (two) times daily as needed for mild constipation. 10 capsule 0  . levothyroxine (SYNTHROID, LEVOTHROID) 25 MCG tablet Take 1 tablet (25 mcg total) by mouth daily before breakfast. 30 tablet 5  . lisinopril  (PRINIVIL,ZESTRIL) 40 MG tablet Take 40 mg by mouth daily.    . Multiple Vitamins-Minerals (VITEYES AREDS ADVANCED PO) Take by mouth.    . nitroGLYCERIN (NITROSTAT) 0.4 MG SL tablet Place 1 tablet (0.4 mg total) under the tongue every 5 (five) minutes as needed for chest pain. 25 tablet 1  . pantoprazole (PROTONIX) 40 MG tablet Take 1 tablet (40 mg total) by mouth daily at 12 noon. 30 tablet 0  . Pitavastatin Calcium (LIVALO) 2 MG TABS Take 1 tablet (2 mg total) by mouth daily. 30 tablet 11  . prasugrel (EFFIENT) 10 MG TABS tablet Take 1 tablet (10 mg total) by mouth daily. 30 tablet 1   No current facility-administered medications on file prior to visit.    Social History  Substance Use Topics  . Smoking status: Never Smoker   . Smokeless tobacco: Never Used  . Alcohol Use: No    Review of Systems  Constitutional: Positive for fever (100F last night, resolved). Negative for chills.  Cardiovascular: Negative for chest pain.  Gastrointestinal: Negative for nausea, vomiting and abdominal pain.  Musculoskeletal: Negative for myalgias and arthralgias.  Skin: Positive for wound. Negative for rash.  Neurological: Negative for headaches.  Hematological: Negative for adenopathy.      Objective:    BP 130/82 mmHg  Pulse 67  Temp(Src) 98 F (36.7  C) (Oral)  Resp 16  Wt 182 lb (82.555 kg)  SpO2 97%   Physical Exam  Constitutional: She appears well-developed and well-nourished.  Eyes: Conjunctivae are normal.  Cardiovascular: Normal rate, regular rhythm, normal heart sounds and normal pulses.   No LE edema, palpable cords or masses. No erythema or increased warmth. Negative Homan sign bilaterally.  LE hair growth symmetric and present. No discoloration of varicosities noted. LE warm and palpable pedal pulses.   Pulmonary/Chest: Effort normal and breath sounds normal. She has no wheezes. She has no rhonchi. She has no rales.  Musculoskeletal:       Feet:  Several pinpoint  pustular lesions noted left anterior ankle. No drainage, streaking.  Neurological: She is alert.  Skin: Skin is warm and dry.  Psychiatric: She has a normal mood and affect. Her speech is normal and behavior is normal. Thought content normal.  Vitals reviewed.      Assessment & Plan:   1. Cellulitis of left lower extremity Working diagnosis of purulent cellulitis. Afebrile at this time. Localized infection.Covering patient with MRSA agent as she works in a hospital setting and due to the purulence noted inside the lesions.   - sulfamethoxazole-trimethoprim (BACTRIM DS) 800-160 MG tablet; Take 1 tablet by mouth 2 (two) times daily.  Dispense: 20 tablet; Refill: 0    I am having Ms. [name] start on sulfamethoxazole-trimethoprim. I am also having her maintain her atenolol, lisinopril, amLODipine, aspirin, docusate sodium, nitroGLYCERIN, pantoprazole, prasugrel, Pitavastatin Calcium, cholecalciferol, Multiple Vitamins-Minerals (VITEYES AREDS ADVANCED PO), and levothyroxine.   Meds ordered this encounter  Medications  . sulfamethoxazole-trimethoprim (BACTRIM DS) 800-160 MG tablet    Sig: Take 1 tablet by mouth 2 (two) times daily.    Dispense:  20 tablet    Refill:  0    Order Specific Question:  Supervising Provider    Answer:  Cassandria Anger [[id number]]     Start medications as prescribed and explained to patient on After Visit Summary ( AVS). Risks, benefits, and alternatives of the medications and treatment plan prescribed today were discussed, and patient expressed understanding.   Education regarding symptom management and diagnosis given to patient.   Follow-up:Plan follow-up and return precautions given if any worsening symptoms or change in condition.   Continue to follow with Walker Kehr, MD for routine health maintenance.   [name] and I agreed with plan.   Mable Paris, FNP

## 2016-02-17 NOTE — Patient Instructions (Addendum)
Suspect skin infection, possibly from spider.   Warm Epson salt soaks. Please let us know if you see any increased redness, pain, swelling, streaks.  If there is no improvement in your symptoms, or if there is any worsening of symptoms, or if you have any additional concerns, please return for re-evaluation; or, if we are closed, consider going to the Emergency Room for evaluation if symptoms urgent.  Cellulitis Cellulitis is an infection of the skin and the tissue beneath it. The infected area is usually red and tender. Cellulitis occurs most often in the arms and lower legs.  CAUSES  Cellulitis is caused by bacteria that enter the skin through cracks or cuts in the skin. The most common types of bacteria that cause cellulitis are staphylococci and streptococci. SIGNS AND SYMPTOMS   Redness and warmth.  Swelling.  Tenderness or pain.  Fever. DIAGNOSIS  Your health care provider can usually determine what is wrong based on a physical exam. Blood tests may also be done. TREATMENT  Treatment usually involves taking an antibiotic medicine. HOME CARE INSTRUCTIONS   Take your antibiotic medicine as directed by your health care provider. Finish the antibiotic even if you start to feel better.  Keep the infected arm or leg elevated to reduce swelling.  Apply a warm cloth to the affected area up to 4 times per day to relieve pain.  Take medicines only as directed by your health care provider.  Keep all follow-up visits as directed by your health care provider. SEEK MEDICAL CARE IF:   You notice red streaks coming from the infected area.  Your red area gets larger or turns dark in color.  Your bone or joint underneath the infected area becomes painful after the skin has healed.  Your infection returns in the same area or another area.  You notice a swollen bump in the infected area.  You develop new symptoms.  You have a fever. SEEK IMMEDIATE MEDICAL CARE IF:   You feel very  sleepy.  You develop vomiting or diarrhea.  You have a general ill feeling (malaise) with muscle aches and pains.   This information is not intended to replace advice given to you by your health care provider. Make sure you discuss any questions you have with your health care provider.   Document Released: 06/22/2005 Document Revised: 06/03/2015 Document Reviewed: 11/28/2011 Elsevier Interactive Patient Education [employer].

## 2016-05-22 ENCOUNTER — Encounter (HOSPITAL_COMMUNITY): Payer: Self-pay | Admitting: Emergency Medicine

## 2016-05-22 ENCOUNTER — Emergency Department (HOSPITAL_COMMUNITY)
Admission: EM | Admit: 2016-05-22 | Discharge: 2016-05-22 | Disposition: A | Discharge status: Home / Self Care | Payer: Commercial Managed Care - PPO | Attending: Emergency Medicine | Admitting: Emergency Medicine

## 2016-05-22 DIAGNOSIS — Z7982 Long term (current) use of aspirin: Secondary | ICD-10-CM | POA: Diagnosis not present

## 2016-05-22 DIAGNOSIS — Y929 Unspecified place or not applicable: Secondary | ICD-10-CM | POA: Insufficient documentation

## 2016-05-22 DIAGNOSIS — E039 Hypothyroidism, unspecified: Secondary | ICD-10-CM | POA: Insufficient documentation

## 2016-05-22 DIAGNOSIS — Y999 Unspecified external cause status: Secondary | ICD-10-CM | POA: Diagnosis not present

## 2016-05-22 DIAGNOSIS — Y939 Activity, unspecified: Secondary | ICD-10-CM | POA: Insufficient documentation

## 2016-05-22 DIAGNOSIS — Z79899 Other long term (current) drug therapy: Secondary | ICD-10-CM | POA: Insufficient documentation

## 2016-05-22 DIAGNOSIS — S5012XA Contusion of left forearm, initial encounter: Secondary | ICD-10-CM | POA: Insufficient documentation

## 2016-05-22 DIAGNOSIS — I2510 Atherosclerotic heart disease of native coronary artery without angina pectoris: Secondary | ICD-10-CM | POA: Insufficient documentation

## 2016-05-22 DIAGNOSIS — S59912A Unspecified injury of left forearm, initial encounter: Secondary | ICD-10-CM | POA: Diagnosis present

## 2016-05-22 DIAGNOSIS — S40022A Contusion of left upper arm, initial encounter: Secondary | ICD-10-CM

## 2016-05-22 DIAGNOSIS — X58XXXA Exposure to other specified factors, initial encounter: Secondary | ICD-10-CM | POA: Diagnosis not present

## 2016-05-22 DIAGNOSIS — I10 Essential (primary) hypertension: Secondary | ICD-10-CM | POA: Diagnosis not present

## 2016-05-22 LAB — CBC WITH DIFFERENTIAL/PLATELET
Basophils Absolute: 0 10*3/uL (ref 0.0–0.1)
Basophils Relative: 0 %
EOS ABS: 0.3 10*3/uL (ref 0.0–0.7)
EOS PCT: 4 %
HCT: 32.6 % — ABNORMAL LOW (ref 36.0–46.0)
Hemoglobin: 10.1 g/dL — ABNORMAL LOW (ref 12.0–15.0)
LYMPHS ABS: 3.4 10*3/uL (ref 0.7–4.0)
LYMPHS PCT: 42 %
MCH: 25.8 pg — AB (ref 26.0–34.0)
MCHC: 31 g/dL (ref 30.0–36.0)
MCV: 83.4 fL (ref 78.0–100.0)
MONO ABS: 0.4 10*3/uL (ref 0.1–1.0)
MONOS PCT: 5 %
Neutro Abs: 3.8 10*3/uL (ref 1.7–7.7)
Neutrophils Relative %: 49 %
PLATELETS: 300 10*3/uL (ref 150–400)
RBC: 3.91 MIL/uL (ref 3.87–5.11)
RDW: 15.9 % — ABNORMAL HIGH (ref 11.5–15.5)
WBC: 7.9 10*3/uL (ref 4.0–10.5)

## 2016-05-22 LAB — COMPREHENSIVE METABOLIC PANEL
ALK PHOS: 40 U/L (ref 38–126)
ALT: 14 U/L (ref 14–54)
AST: 17 U/L (ref 15–41)
Albumin: 3.7 g/dL (ref 3.5–5.0)
Anion gap: 5 (ref 5–15)
BILIRUBIN TOTAL: 0.6 mg/dL (ref 0.3–1.2)
BUN: 10 mg/dL (ref 6–20)
CHLORIDE: 104 mmol/L (ref 101–111)
CO2: 25 mmol/L (ref 22–32)
CREATININE: 0.84 mg/dL (ref 0.44–1.00)
Calcium: 8.9 mg/dL (ref 8.9–10.3)
GFR calc Af Amer: >60 mL/min (ref >60)
GFR calc non Af Amer: >60 mL/min (ref >60)
GLUCOSE: 80 mg/dL (ref 65–99)
POTASSIUM: 4.1 mmol/L (ref 3.5–5.1)
Sodium: 134 mmol/L — ABNORMAL LOW (ref 135–145)
Total Protein: 7 g/dL (ref 6.5–8.1)

## 2016-05-22 LAB — PROTIME-INR
INR: 1.15
PROTHROMBIN TIME: 14.8 s (ref 11.4–15.2)

## 2016-05-22 NOTE — ED Triage Notes (Signed)
Pt reports that yesterday she had 2 knots come up on her arm and today they are very swollen and painful. Pt has significant edema and mild bruising to left arm. Pt reports taking effient and that her PCP wanted her to come here "to make sure my blood is clotting right." No known injury

## 2016-05-22 NOTE — ED Notes (Signed)
Will Dansie, PA at bedside at this time. 

## 2016-05-22 NOTE — ED Notes (Signed)
Ice pack provided to patient. 

## 2016-05-22 NOTE — ED Provider Notes (Signed)
[facility] DEPT Provider Note   CSN: [id number] Arrival date & time: 05/22/16  1738     History   Chief Complaint Chief Complaint  Patient presents with  . Arm Swelling    HPI [name] is a 50 y.o. female.  [name] is a 50 y.o. Female who presents to the ED complaining of left forearm pain and swelling since yesterday. The patient reports she first noticed 2 small bumps on her left forearm that were slightly painful, that then increased in size over the past two days. Now she states the areas are larger to her left forearm that are tender and have some overlying bruising. She feels that her arm is swollen because she had to remove her rings off her hand. She denies any injury or trauma to the area. She reports she is on Effient and spoke with her doctor's nurse line who recommended she come to the ED to rule out a blood clot. She reports having a numbness sensation to her left arm earlier today that has resolved. She denies history of DVT or PE. She denies any known injury or trauma to the area. She denies fevers, chest pain, shortness of breath, palpitations, abdominal pain, nausea, vomiting, trauma, leg pain, leg swelling. She denies recent long travel or endogenous estrogen use. No history of blood clotting disorders. She is not a smoker.   The history is provided by the patient. No language interpreter was used.    Past Medical History:  Diagnosis Date  . Acute pancreatitis 2011  . Allergy   . Anemia   . CAD (coronary artery disease) 2009   Dr Olegario Shearer -[facility], Heart Cath  . Family history of adverse reaction to anesthesia    "mother and brother have difficulty waking up out from it"  . GERD (gastroesophageal reflux disease)   . Hyperlipidemia   . Hypertension   . Hypothyroidism    "recently dx'd" (12/07/2015)  . PVC (premature ventricular contraction)   . Statin intolerance   . STD (sexually transmitted disease)    CHL 20 yrs ago  . Thyroid  nodule 2010   "precancerous; thyroid treated with radiation"    Patient Active Problem List   Diagnosis Date Noted  . Palpitations 12/28/2015  . CAD (coronary artery disease), native coronary artery 12/06/2015  . Chest pain 12/04/2015  . Epigastric abdominal pain 12/04/2015  . Bladder pain 05/14/2015  . Incomplete bladder emptying 05/14/2015  . Occipital neuralgia of left side 11/14/2013  . Aspiration into respiratory tract 04/03/2012  . Cough due to bronchospasm 04/03/2012  . Chest pain, atypical 04/03/2012  . HYPERGLYCEMIA 06/16/2010  . BACK PAIN, LUMBAR 05/27/2009  . THYROID NODULE, RIGHT 05/04/2009  . HYPERTHYROIDISM 05/04/2009  . Hyperlipidemia with target LDL less than 70 12/31/2008  . H/O acute pancreatitis 12/31/2008  . LEG PAIN, BILATERAL 12/31/2008  . PARESTHESIA 12/31/2008  . EDEMA 12/31/2008  . RLQ abdominal pain 10/02/2007  . GERD 09/17/2007  . DYSPNEA 08/28/2007  . Dysphagia 08/28/2007  . Essential hypertension 04/24/2007    Past Surgical History:  Procedure Laterality Date  . CARDIAC CATHETERIZATION     unable to do cardiac cath but does stress echo 11/22/11  . CARDIAC CATHETERIZATION N/A 12/04/2015   Procedure: Left Heart Cath and Coronary Angiography;  Surgeon: Adrian Prows, MD;  Location: [facility];  Service: Cardiovascular;  Laterality: N/A;  . CARDIAC CATHETERIZATION N/A 12/04/2015   Procedure: Intravascular Pressure Wire/FFR Study;  Surgeon: Adrian Prows, MD;  Location:  [facility];  Service: Cardiovascular;  Laterality: N/A;  . CARDIAC CATHETERIZATION N/A 12/07/2015   Procedure: Coronary Stent Intervention;  Surgeon: Adrian Prows, MD;  Location: [facility];  Service: Cardiovascular;  Laterality: N/A;  . EYE MUSCLE SURGERY Left 1969  . LAPAROSCOPIC CHOLECYSTECTOMY  2008  . TUBAL LIGATION Bilateral 1995  . UPPER GASTROINTESTINAL ENDOSCOPY    . VENTRICULAR ABLATION SURGERY  2006,2010   times 2, follow-up for v-tack    OB History     Gravida Para Term Preterm AB Living   4 4 4     4    SAB TAB Ectopic Multiple Live Births           4       Home Medications    Prior to Admission medications   Medication Sig Start Date End Date Taking? Authorizing Provider  amLODipine (NORVASC) 5 MG tablet Take 1 tablet (5 mg total) by mouth daily. 12/08/15  Yes Neldon Labella, NP  aspirin EC 81 MG tablet Take 81 mg by mouth daily.   Yes Historical Provider, MD  atenolol (TENORMIN) 25 MG tablet Take 25 mg by mouth daily.   Yes Historical Provider, MD  Cholecalciferol (VITAMIN D) 2000 units tablet Take 2,000 Units by mouth daily.   Yes Historical Provider, MD  lisinopril (PRINIVIL,ZESTRIL) 40 MG tablet Take 40 mg by mouth daily.   Yes Historical Provider, MD  nitroGLYCERIN (NITROSTAT) 0.4 MG SL tablet Place 1 tablet (0.4 mg total) under the tongue every 5 (five) minutes as needed for chest pain. 12/08/15  Yes Neldon Labella, NP  Pitavastatin Calcium (LIVALO) 2 MG TABS Take 1 tablet (2 mg total) by mouth daily. Patient taking differently: Take 1 tablet by mouth daily at 6 PM.  12/21/15  Yes Janith Lima, MD  Polyethyl Glycol-Propyl Glycol (SYSTANE OP) Place 1 drop into both eyes daily as needed (dry eyes).   Yes Historical Provider, MD  prasugrel (EFFIENT) 10 MG TABS tablet Take 1 tablet (10 mg total) by mouth daily. 12/08/15  Yes Neldon Labella, NP  aspirin 81 MG chewable tablet Chew 1 tablet (81 mg total) by mouth daily. Patient not taking: Reported on 05/22/2016 12/08/15   Neldon Labella, NP  docusate sodium (COLACE) 100 MG capsule Take 1 capsule (100 mg total) by mouth 2 (two) times daily as needed for mild constipation. Patient not taking: Reported on 05/22/2016 12/08/15   Neldon Labella, NP  levothyroxine (SYNTHROID, LEVOTHROID) 25 MCG tablet Take 1 tablet (25 mcg total) by mouth daily before breakfast. Patient not taking: Reported on 05/22/2016 12/28/15   Renato Shin, MD  pantoprazole (PROTONIX) 40 MG tablet Take 1 tablet (40  mg total) by mouth daily at 12 noon. Patient not taking: Reported on 05/22/2016 12/08/15   Neldon Labella, NP    Family History Family History  Problem Relation Age of Onset  . Hypertension Father   . Prostate cancer Father   . Heart attack Father   . Colon cancer Father 23  . Hypertension Mother   . Diabetes Mother   . Crohn's disease Daughter   . Heart attack Cousin   . Diabetes Maternal Grandmother   . Cancer Sister     ovarian  . Diabetes Brother   . Esophageal cancer Neg Hx   . Rectal cancer Neg Hx   . Stomach cancer Neg Hx     Social History Social History  Substance Use Topics  . Smoking status: Never Smoker  . Smokeless tobacco: Never  Used  . Alcohol use No     Allergies   Crestor [rosuvastatin calcium] and Lipitor [atorvastatin]   Review of Systems Review of Systems  Constitutional: Negative for chills and fever.  HENT: Negative for congestion and sore throat.   Eyes: Negative for visual disturbance.  Respiratory: Negative for cough and shortness of breath.   Cardiovascular: Negative for chest pain.  Gastrointestinal: Negative for abdominal pain, blood in stool, nausea and vomiting.  Genitourinary: Negative for dysuria.  Musculoskeletal: Positive for arthralgias and myalgias. Negative for back pain and neck pain.  Skin: Positive for color change. Negative for rash.  Neurological: Negative for light-headedness and headaches.     Physical Exam Updated Vital Signs BP 138/99   Pulse (!) 52   Temp 98.1 F (36.7 C) (Oral)   Resp 16   Ht 5\' 2"  (1.575 m)   Wt 77.1 kg   LMP 05/15/2016 (Approximate)   SpO2 100%   BMI 31.07 kg/m   Physical Exam  Constitutional: She is oriented to person, place, and time. She appears well-developed and well-nourished. No distress.  Nontoxic appearing.  HENT:  Head: Normocephalic and atraumatic.  Mouth/Throat: Oropharynx is clear and moist.  Eyes: Conjunctivae are normal. Pupils are equal, round, and reactive to  light. Right eye exhibits no discharge. Left eye exhibits no discharge.  Neck: Normal range of motion. Neck supple. No JVD present. No tracheal deviation present.  Cardiovascular: Normal rate, regular rhythm, normal heart sounds and intact distal pulses.  Exam reveals no gallop and no friction rub.   No murmur heard. Bilateral radial, posterior tibialis and dorsalis pedis pulses are intact.    Pulmonary/Chest: Effort normal and breath sounds normal. No stridor. No respiratory distress. She has no wheezes. She has no rales.  Lungs CTA bilaterally.   Abdominal: Soft. There is no tenderness.  Musculoskeletal: She exhibits edema and tenderness.  Patient has soft tissue swelling with overlying ecchymosis to her left forearm on the extensor surface.  there are two distinct areas about 3-4 cm in size. They are TTP. No fluctuance, discharge, induration, erythema or abscess. No left hand edema noted. No left elbow, wrist or hand TTP.   Lymphadenopathy:    She has no cervical adenopathy.  Neurological: She is alert and oriented to person, place, and time. Coordination normal.  Sensation is intact in her bilateral upper and lower extremities. Good grip strengths bilaterally.  Skin: Skin is warm and dry. Capillary refill takes less than 2 seconds. No rash noted. She is not diaphoretic. No erythema. No pallor.  Psychiatric: She has a normal mood and affect. Her behavior is normal.  Nursing note and vitals reviewed.    ED Treatments / Results  Labs (all labs ordered are listed, but only abnormal results are displayed) Labs Reviewed  COMPREHENSIVE METABOLIC PANEL - Abnormal; Notable for the following:       Result Value   Sodium 134 (*)    All other components within normal limits  CBC WITH DIFFERENTIAL/PLATELET - Abnormal; Notable for the following:    Hemoglobin 10.1 (*)    HCT 32.6 (*)    MCH 25.8 (*)    RDW 15.9 (*)    All other components within normal limits  PROTIME-INR    EKG  EKG  Interpretation None       Radiology No results found.  Procedures Procedures (including critical care time)  Medications Ordered in ED Medications - No data to display   Initial Impression / Assessment and Plan /  ED Course  I have reviewed the triage vital signs and the nursing notes.  Pertinent labs & imaging results that were available during my care of the patient were reviewed by me and considered in my medical decision making (see chart for details).  Clinical Course   Patient presented to the emergency department complaining of 2 painful areas of swelling to her left forearm since yesterday. She denies any known injury or trauma. She denies easy bruising or easy bleeding. She denies gum bleeding, nosebleeds or hematochezia. She is on Effient.  On exam the patient is afebrile and nontoxic-appearing. She is neurovascularly intact. Good radial pulses. Good left brachial pulse. Patient has 2 areas of what appear to be bruising to her left forearm on the extensor surface. There is mild edema with overlying ecchymosis. No petechiae, erythema, induration, fluctuance or abscess. I do not see any hand or upper arm edema.  CMP is unremarkable. CBC reveals a hemoglobin of 10.1. Patient denies any lightheadedness or dizziness. No shortness of breath. Platelet count is normal at 300. INR is 1.15. Patient is not anticoagulated on coumadin.  Dr. Colvin Caroli evaluated the patient and thinks this is bruising and not concerning for DVT. The area is not overlying concerning vessels. No other arm swelling noted.  I agree with this assessment. No [place] at this time in the ED. Blood work is reassuring. Normal platelet count. I encouraged her to use ice to the area and follow-up closely with primary care. I discussed that if the bruising or swelling becomes worse or she has swelling of her arm or areas of redness she needs to be reevaluated in the emergency department immediately. I advised the patient to  follow-up with their primary care provider this week. I advised the patient to return to the emergency department with new or worsening symptoms or new concerns. The patient verbalized understanding and agreement with plan.     Final Clinical Impressions(s) / ED Diagnoses   Final diagnoses:  Hematoma of arm, left, initial encounter    New Prescriptions Discharge Medication List as of 05/22/2016 11:21 PM       Waynetta Pean, PA-C 05/22/16 2343    Charlesetta Shanks, MD 06/14/16 [phone]

## 2016-05-22 NOTE — Discharge Instructions (Signed)
You appear to have area of bruising to your arm. We are not concerned about a blood clot today. Please follow up with your doctor. If your symptoms worsen or change please return to the ER like we discussed.

## 2016-05-24 ENCOUNTER — Telehealth: Payer: Self-pay | Admitting: Internal Medicine

## 2016-05-24 NOTE — Telephone Encounter (Signed)
Pt request to speak to the assistant concern about her health condition. Please call her back

## 2016-05-24 NOTE — Telephone Encounter (Signed)
I called pt- she states she has scheduled a OV with PCP for tomorrow 05/25/16.

## 2016-05-25 ENCOUNTER — Other Ambulatory Visit: Payer: Commercial Managed Care - PPO

## 2016-05-25 ENCOUNTER — Ambulatory Visit (INDEPENDENT_AMBULATORY_CARE_PROVIDER_SITE_OTHER): Payer: Commercial Managed Care - PPO | Admitting: Nurse Practitioner

## 2016-05-25 ENCOUNTER — Encounter: Payer: Self-pay | Admitting: Nurse Practitioner

## 2016-05-25 VITALS — BP 132/4 | HR 62 | Temp 98.0°F | Ht 62.0 in | Wt 165.0 lb

## 2016-05-25 DIAGNOSIS — R1031 Right lower quadrant pain: Secondary | ICD-10-CM | POA: Diagnosis not present

## 2016-05-25 DIAGNOSIS — K219 Gastro-esophageal reflux disease without esophagitis: Secondary | ICD-10-CM

## 2016-05-25 DIAGNOSIS — R195 Other fecal abnormalities: Secondary | ICD-10-CM

## 2016-05-25 DIAGNOSIS — D649 Anemia, unspecified: Secondary | ICD-10-CM | POA: Diagnosis not present

## 2016-05-25 DIAGNOSIS — D500 Iron deficiency anemia secondary to blood loss (chronic): Secondary | ICD-10-CM

## 2016-05-25 LAB — POCT URINALYSIS DIPSTICK
BILIRUBIN UA: NEGATIVE
Glucose, UA: NEGATIVE
NITRITE UA: NEGATIVE
PH UA: 7
RBC UA: NEGATIVE
Spec Grav, UA: 1.015
Urobilinogen, UA: 0.2

## 2016-05-25 LAB — IFOBT (OCCULT BLOOD): IFOBT: POSITIVE

## 2016-05-25 NOTE — Patient Instructions (Addendum)
Left fore arm bruise looks stable. It is healing and does not need any additional imaging. Return to office if develops worsening arm pain, swelling redness or numbness/tingling.  CT ABD indicated possible chronic gastric inflammation which is related to gastritis. 2016 endoscopy was negative for H/pylori. Go to lab for blood draw. Will get call from GI for f/up  Schedule appt with GYN as discussed for fibroids and endometriosis.

## 2016-05-25 NOTE — Progress Notes (Signed)
Subjective:  Patient ID: [name], female    DOB: 12/06/1965  Age: 50 y.o. MRN: [id number]  CC: Follow-up (was told that she may be lossing blood "somewhere" at recent ER visit) and Abdominal Pain (RLQ x month)  HPI She was seen in ED 3days ago for left forearm bruising and swelling, which have now improved. She is concerned about worsening anemia per CBC results 3days ago.  Abdominal Pain  This is a new problem. The current episode started more than 1 month ago. The onset quality is gradual. The problem occurs constantly. The problem has been gradually worsening. The pain is located in the RLQ. The pain is moderate. The quality of the pain is colicky. The abdominal pain radiates to the back. Pertinent negatives include no anorexia, belching, constipation, diarrhea, dysuria, fever, frequency, hematochezia, hematuria, melena, nausea, vomiting or weight loss. Nothing aggravates the pain. The pain is relieved by nothing. She has tried proton pump inhibitors for the symptoms. Prior diagnostic workup includes CT scan and upper endoscopy (CT ABD/pelvis 03/2016 indicates gastritis and bladder wall inflammation, Colonoscopy 2016  was normal with benign polyps.). Her past medical history is significant for GERD. fibriods and endometriosis (followed by GYN)  Still has menstrual cycle (irregular but not heavy, no blood clots)  Outpatient Medications Prior to Visit  Medication Sig Dispense Refill  . amLODipine (NORVASC) 5 MG tablet Take 1 tablet (5 mg total) by mouth daily. 30 tablet 1  . aspirin 81 MG chewable tablet Chew 1 tablet (81 mg total) by mouth daily. 30 tablet 1  . atenolol (TENORMIN) 25 MG tablet Take 25 mg by mouth daily.    . Cholecalciferol (VITAMIN D) 2000 units tablet Take 2,000 Units by mouth daily.    Marland Kitchen lisinopril (PRINIVIL,ZESTRIL) 40 MG tablet Take 40 mg by mouth daily.    . nitroGLYCERIN (NITROSTAT) 0.4 MG SL tablet Place 1 tablet (0.4 mg total) under the tongue every 5 (five)  minutes as needed for chest pain. 25 tablet 1  . Pitavastatin Calcium (LIVALO) 2 MG TABS Take 1 tablet (2 mg total) by mouth daily. (Patient taking differently: Take 1 tablet by mouth daily at 6 PM. ) 30 tablet 11  . Polyethyl Glycol-Propyl Glycol (SYSTANE OP) Place 1 drop into both eyes daily as needed (dry eyes).    . prasugrel (EFFIENT) 10 MG TABS tablet Take 1 tablet (10 mg total) by mouth daily. 30 tablet 1  . aspirin EC 81 MG tablet Take 81 mg by mouth daily.    Marland Kitchen docusate sodium (COLACE) 100 MG capsule Take 1 capsule (100 mg total) by mouth 2 (two) times daily as needed for mild constipation. (Patient not taking: Reported on 05/22/2016) 10 capsule 0  . levothyroxine (SYNTHROID, LEVOTHROID) 25 MCG tablet Take 1 tablet (25 mcg total) by mouth daily before breakfast. (Patient not taking: Reported on 05/22/2016) 30 tablet 5  . pantoprazole (PROTONIX) 40 MG tablet Take 1 tablet (40 mg total) by mouth daily at 12 noon. (Patient not taking: Reported on 05/22/2016) 30 tablet 0   No facility-administered medications prior to visit.     ROS See HPI  Objective:  BP (!) 132/4 (BP Location: Left Arm, Patient Position: Sitting, Cuff Size: Normal)   Pulse 62   Temp 98 F (36.7 C) (Oral)   Ht 5\' 2"  (1.575 m)   Wt 165 lb (74.8 kg)   LMP 05/15/2016 (Approximate)   SpO2 97%   BMI 30.18 kg/m   BP Readings from Last  3 Encounters:  05/25/16 (!) 132/4  05/22/16 138/99  02/17/16 130/82    Wt Readings from Last 3 Encounters:  05/25/16 165 lb (74.8 kg)  05/22/16 169 lb 14 oz (77.1 kg)  02/17/16 182 lb (82.6 kg)    Physical Exam  Neck: Normal range of motion. Neck supple.  Cardiovascular: Normal rate.   Pulmonary/Chest: Effort normal.  Abdominal: Soft. Bowel sounds are normal. She exhibits no distension.  Genitourinary: Rectal exam shows external hemorrhoid and guaiac positive stool. Rectal exam shows no internal hemorrhoid and no tenderness.  Musculoskeletal:       Left elbow: Normal.        Left wrist: Normal.       Right forearm: She exhibits no tenderness, no bony tenderness and no swelling.  Skin: Ecchymosis noted.  Healing ecchymosis on left fore arm.  Vitals reviewed.   Lab Results  Component Value Date   WBC 7.9 05/22/2016   HGB 10.1 (L) 05/22/2016   HCT 32.6 (L) 05/22/2016   PLT 300 05/22/2016   GLUCOSE 80 05/22/2016   CHOL 212 (H) 12/04/2015   TRIG 89 12/04/2015   HDL 55 12/04/2015   LDLCALC 139 (H) 12/04/2015   ALT 14 05/22/2016   AST 17 05/22/2016   NA 134 (L) 05/22/2016   K 4.1 05/22/2016   CL 104 05/22/2016   CREATININE 0.84 05/22/2016   BUN 10 05/22/2016   CO2 25 05/22/2016   TSH 5.11 (H) 12/28/2015   INR 1.15 05/22/2016   HGBA1C 5.6 12/04/2015    No results found.  Assessment & Plan:   [name] was seen today for follow-up and abdominal pain.  Diagnoses and all orders for this visit:  Anemia, unspecified anemia type -     IFOBT POC (occult bld, rslt in office) -     Anemia panel; Future -     Ambulatory referral to Gastroenterology  RLQ abdominal pain -     POCT Urinalysis Dipstick -     Urine culture; Future  Gastroesophageal reflux disease without esophagitis -     Ambulatory referral to Gastroenterology  Occult blood positive stool -     Ambulatory referral to Gastroenterology   I am having Ms. [name] maintain her atenolol, lisinopril, amLODipine, aspirin, docusate sodium, nitroGLYCERIN, pantoprazole, prasugrel, Pitavastatin Calcium, levothyroxine, aspirin EC, Vitamin D, and Polyethyl Glycol-Propyl Glycol (SYSTANE OP).  No orders of the defined types were placed in this encounter.   Follow-up: Return if symptoms worsen or fail to improve.  Elizabeth Lacy, NP

## 2016-05-25 NOTE — Progress Notes (Signed)
Pre visit review using our clinic review tool, if applicable. No additional management support is needed unless otherwise documented below in the visit note. 

## 2016-05-26 LAB — ANEMIA PANEL
%SAT: 7 % — ABNORMAL LOW (ref 11–50)
ABS RETIC: 49920 {cells}/uL (ref 20000–80000)
FERRITIN: 6 ng/mL — AB (ref 10–232)
Folate: 16.3 ng/mL (ref >5.4)
IRON: 29 ug/dL — AB (ref 45–160)
RBC.: 4.16 MIL/uL (ref 3.80–5.10)
RETIC CT PCT: 1.2 %
TIBC: 412 ug/dL (ref 250–450)
UIBC: 383 ug/dL (ref 125–400)
Vitamin B-12: 395 pg/mL (ref 200–1100)

## 2016-05-26 MED ORDER — FERROUS SULFATE 325 (65 FE) MG PO TABS: 325.0000 mg | ORAL_TABLET | Freq: Every day | ORAL | 3 refills | Status: DC

## 2016-05-27 LAB — URINE CULTURE

## 2016-06-03 ENCOUNTER — Telehealth: Payer: Self-pay | Admitting: Certified Nurse Midwife

## 2016-06-03 NOTE — Telephone Encounter (Signed)
Patient sent a message via MyChart.:  Appointment Request From: [name]    With Provider: Gay Filler [place]    Preferred Date Range: From 06/08/2016 To 06/09/2016    Preferred Times: Any    Reason for visit: Office Visit    Comments:  My primary doctor wants me to make an appointment because my hemoglobin has dropped 14 to 10 and test need to be done to locate where I'm bleeding      I scheduled her 06/08/16@ 11:00am with Melvia Heaps, CNM.

## 2016-06-06 NOTE — Telephone Encounter (Signed)
Spoke with patient. Patient states that she is not currenly experiencing any bleeding. Reports she was advised by her PCP that she needs to be seen for evaluation to rule out GYN source for decrease in hemoglobin level. Reports her hemoglobin went from 14 to 10. Has a history of fibroids so PCP is recommending she be seen for further evaluation. Patient has seen Dr.Silva in the past. Advised I recommend she be seen with Dr.Silva for evaluation. She is agreeable. Appointment switch to 06/08/2016 at 11 am with Dr.Silva. She is agreeable to date and time.  Routing to provider for final review.

## 2016-06-06 NOTE — Telephone Encounter (Signed)
I am happy to see the patient back.  Encounter closed.

## 2016-06-08 ENCOUNTER — Ambulatory Visit: Payer: Commercial Managed Care - PPO | Admitting: Certified Nurse Midwife

## 2016-06-08 ENCOUNTER — Ambulatory Visit (INDEPENDENT_AMBULATORY_CARE_PROVIDER_SITE_OTHER): Payer: Commercial Managed Care - PPO | Admitting: Obstetrics and Gynecology

## 2016-06-08 ENCOUNTER — Encounter: Payer: Self-pay | Admitting: Obstetrics and Gynecology

## 2016-06-08 VITALS — BP 120/80 | HR 60 | Resp 12 | Ht 62.0 in | Wt 165.6 lb

## 2016-06-08 DIAGNOSIS — D259 Leiomyoma of uterus, unspecified: Secondary | ICD-10-CM

## 2016-06-08 DIAGNOSIS — D649 Anemia, unspecified: Secondary | ICD-10-CM | POA: Diagnosis not present

## 2016-06-08 NOTE — Progress Notes (Signed)
GYNECOLOGY  VISIT   HPI: 50 y.o.   Divorced  [place] American  female   [phone] with Patient's last menstrual period was 05/28/2016.   here referred by PCP-Dr. Plotnikov to see why Hgb is low. Patient has history of fibroids. Patient states is not bleeding today. Wants to r/o any GYN issues.    Hgb with PCP 2 week ago:  10.1.  Iron 29, ferritin 6. States her diet is rich in iron.  Also taking iron supplement.   Menses monthly. Menses last 6 days.   First day normal with pad change every 3 - 4 hours. Second day has pad change every 3 hours.  Remainer of cycle is really light and uses panty liners. No intermenstrual bleeding.   No blood in urine.   Noted to have blood in her stool with stool card kit with PCP.  Will see Dr. Fuller Plan, GI, in October for further evaluation.   CT scan done in 2016 due to pelvic pain.  Noted to have bladder wall thickening, multiple uterine fibroids, and possible gastric outlet problem.   Had pelvic ultrasound in follow up on 04/09/15 and uterus had 3 fibroids - 0.63 - 3.17 cm.  EMS 11.63 mm.  Ovaries were normal.  EMB then showed benign secretory endometrium.    Saw urology for evaluation of bladder and was treated with Flomax to improved emptying.  Had normal cystoscopy.  Records in [place].   Hgb today - 11.2.  GYNECOLOGIC HISTORY: Patient's last menstrual period was 05/28/2016. Contraception: BTL Menopausal hormone therapy:  none Last mammogram:  2016-normal per patient.  Will schedule at [facility].  Last pap smear: 02/08/13 neg HR HPV neg        OB History    Gravida Para Term Preterm AB Living   _0 SAB TAB Ectopic Multiple Live Births           4         Patient Active Problem List   Diagnosis Date Noted  . Palpitations 12/28/2015  . CAD (coronary artery disease), native coronary artery 12/06/2015  . Chest pain 12/04/2015  . Epigastric abdominal pain 12/04/2015  . Bladder pain 05/14/2015  . Incomplete  bladder emptying 05/14/2015  . Occipital neuralgia of left side 11/14/2013  . Aspiration into respiratory tract 04/03/2012  . Cough due to bronchospasm 04/03/2012  . Chest pain, atypical 04/03/2012  . HYPERGLYCEMIA 06/16/2010  . BACK PAIN, LUMBAR 05/27/2009  . THYROID NODULE, RIGHT 05/04/2009  . HYPERTHYROIDISM 05/04/2009  . Hyperlipidemia with target LDL less than 70 12/31/2008  . H/O acute pancreatitis 12/31/2008  . LEG PAIN, BILATERAL 12/31/2008  . PARESTHESIA 12/31/2008  . EDEMA 12/31/2008  . RLQ abdominal pain 10/02/2007  . GERD 09/17/2007  . DYSPNEA 08/28/2007  . Dysphagia 08/28/2007  . Essential hypertension 04/24/2007    Past Medical History:  Diagnosis Date  . Acute pancreatitis 2011  . Allergy   . Anemia   . CAD (coronary artery disease) 2009   Dr Olegario Shearer -[facility], Heart Cath  . Family history of adverse reaction to anesthesia    "mother and brother have difficulty waking up out from it"  . GERD (gastroesophageal reflux disease)   . Hyperlipidemia   . Hypertension   . Hypothyroidism    "recently dx'd" (12/07/2015)  . PVC (premature ventricular contraction)   . Statin intolerance   . STD (sexually transmitted disease)    CHL 20 yrs ago  .  Thyroid nodule 2010   "precancerous; thyroid treated with radiation"    Past Surgical History:  Procedure Laterality Date  . CARDIAC CATHETERIZATION     unable to do cardiac cath but does stress echo 11/22/11  . CARDIAC CATHETERIZATION N/A 12/04/2015   Procedure: Left Heart Cath and Coronary Angiography;  Surgeon: Adrian Prows, MD;  Location: [place] [facility];  Service: Cardiovascular;  Laterality: N/A;  . CARDIAC CATHETERIZATION N/A 12/04/2015   Procedure: Intravascular Pressure Wire/FFR Study;  Surgeon: Adrian Prows, MD;  Location: [place];  Service: Cardiovascular;  Laterality: N/A;  . CARDIAC CATHETERIZATION N/A 12/07/2015   Procedure: Coronary Stent Intervention;  Surgeon: Adrian Prows, MD;  Location: [place]  [facility];  Service: Cardiovascular;  Laterality: N/A;  . EYE MUSCLE SURGERY Left 1969  . LAPAROSCOPIC CHOLECYSTECTOMY  2008  . TUBAL LIGATION Bilateral 1995  . UPPER GASTROINTESTINAL ENDOSCOPY    . VENTRICULAR ABLATION SURGERY  2006,2010   times 2, follow-up for v-tack    Current Outpatient Prescriptions  Medication Sig Dispense Refill  . amLODipine (NORVASC) 5 MG tablet Take 1 tablet (5 mg total) by mouth daily. 30 tablet 1  . aspirin EC 81 MG tablet Take 81 mg by mouth daily.    Marland Kitchen atenolol (TENORMIN) 25 MG tablet Take 25 mg by mouth daily.    . Cholecalciferol (VITAMIN D) 2000 units tablet Take 2,000 Units by mouth daily.    . ferrous sulfate 325 (65 FE) MG tablet Take 1 tablet (325 mg total) by mouth daily with breakfast. Take with food 30 tablet 3  . lisinopril (PRINIVIL,ZESTRIL) 40 MG tablet Take 40 mg by mouth daily.    . nitroGLYCERIN (NITROSTAT) 0.4 MG SL tablet Place 1 tablet (0.4 mg total) under the tongue every 5 (five) minutes as needed for chest pain. 25 tablet 1  . pantoprazole (PROTONIX) 40 MG tablet Take 1 tablet (40 mg total) by mouth daily at 12 noon. 30 tablet 0  . Pitavastatin Calcium (LIVALO) 2 MG TABS Take 1 tablet (2 mg total) by mouth daily. (Patient taking differently: Take 1 tablet by mouth daily at 6 PM. ) 30 tablet 11  . Polyethyl Glycol-Propyl Glycol (SYSTANE OP) Place 1 drop into both eyes daily as needed (dry eyes).    . prasugrel (EFFIENT) 10 MG TABS tablet Take 1 tablet (10 mg total) by mouth daily. 30 tablet 1   No current facility-administered medications for this visit.      ALLERGIES: Crestor [rosuvastatin calcium] and Lipitor [atorvastatin]  Family History  Problem Relation Age of Onset  . Hypertension Father   . Prostate cancer Father   . Heart attack Father   . Colon cancer Father 17  . Hypertension Mother   . Diabetes Mother   . Crohn's disease Daughter   . Heart attack Cousin   . Diabetes Maternal Grandmother   . Cancer Sister      ovarian  . Diabetes Brother   . Esophageal cancer Neg Hx   . Rectal cancer Neg Hx   . Stomach cancer Neg Hx     Social History   Social History  . Marital status: Divorced    Spouse name: N/A  . Number of children: 4  . 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 Smoking status: Never Smoker  . Smokeless tobacco: Never Used  . Alcohol  use No  . Drug use: No  . Sexual activity: Not Currently    Partners: Male    Birth control/ protection: Surgical, Abstinence     Comment: BTL   Other Topics Concern  . Not on file   Social History Narrative   FAMILY HISTORY   History of hypertension   History of prostate cancer 1st degree relative <50   D, S ovar. CA      Regular exercise - YES      GYN Dr Posey Pronto - [place]    ROS:  Pertinent items are noted in HPI.  PHYSICAL EXAMINATION:    BP 120/80 (BP Location: Right Arm, Patient Position: Sitting, Cuff Size: Normal)   Pulse 60   Resp 12   Ht _0  (1.575 m)   Wt 165 lb 9.6 oz (75.1 kg)   LMP 05/28/2016   BMI 30.29 kg/m     General appearance: alert, cooperative and appears stated age   Pelvic: External genitalia:  no lesions              Urethra:  normal appearing urethra with no masses, tenderness or lesions              Bartholins and Skenes: normal                 Vagina: normal appearing vagina with normal color and discharge, no lesions              Cervix: no lesions                Bimanual Exam:  Uterus:  normal size, contour, position, consistency, mobility, non-tender              Adnexa: no mass, fullness, tenderness              Rectal exam: Yes.  .  Confirms.              Anus:  normal sphincter tone, no lesions  Chaperone was present for exam.  ASSESSMENT  Anemia.  Iron deficiency.  Fibroids. Not enlarging.  Asymptomatic at this time. Positive blood in the stool. Hx CAD. Patient is on blood thinner  Effient.  FH of ovarian and colon cancer. Hx migraines on chart review.  PLAN  I reviewed patient's care for evaluation of the fibroids and bladder thickening noted last year on CT scan. She has had a negative endometrial biopsy.  Discussion of natural hx of fibroids and their benign nature.  We discussed tx options for fibroids - hormonal contraception (estrogen free), Mirena IUD, endometrial ablation, uterine artery embolization, hysterectomy.  Patient prefers to do observation at this time, and I think that this is reasonable as they do not appear to be the immediate source of her anemia.  Proceed with GI consultation.  Return for annual exam and prn.    An After Visit Summary was printed and given to the patient.  _25_____ minutes face to face time of which over 50% was spent in counseling.

## 2016-07-06 ENCOUNTER — Ambulatory Visit: Payer: Commercial Managed Care - PPO | Admitting: Certified Nurse Midwife

## 2016-07-06 ENCOUNTER — Encounter: Payer: Self-pay | Admitting: Certified Nurse Midwife

## 2016-07-06 NOTE — Progress Notes (Deleted)
50 y.o. [id number] Divorced  {Race/ethnicity:17218} Fe here for annual exam.    Patient's last menstrual period was 05/28/2016.          Sexually active: {yes no:314532}  The current method of family planning is tubal ligation.    Exercising: {yes no:314532}  {types:19826} Smoker:  {YES NO:22349}  Health Maintenance: Pap:  02-09-12 neg HPV HR neg MMG:  2016 neg per patient Colonoscopy:  2016 BMD:   *** TDaP:  2013 Shingles: *** Pneumonia: *** Hep C and HIV: *** Labs: ***   reports that she has never smoked. She has never used smokeless tobacco. She reports that she does not drink alcohol or use drugs.  Past Medical History:  Diagnosis Date  . Acute pancreatitis 2011  . Allergy   . Anemia   . CAD (coronary artery disease) 2009   Dr Olegario Shearer -[facility], Heart Cath  . Family history of adverse reaction to anesthesia    "mother and brother have difficulty waking up out from it"  . GERD (gastroesophageal reflux disease)   . Hyperlipidemia   . Hypertension   . Hypothyroidism    "recently dx'd" (12/07/2015)  . PVC (premature ventricular contraction)   . Statin intolerance   . STD (sexually transmitted disease)    CHL 20 yrs ago  . Thyroid nodule 2010   "precancerous; thyroid treated with radiation"  . Tubular adenoma of colon 06/2015    Past Surgical History:  Procedure Laterality Date  . CARDIAC CATHETERIZATION     unable to do cardiac cath but does stress echo 11/22/11  . CARDIAC CATHETERIZATION N/A 12/04/2015   Procedure: Left Heart Cath and Coronary Angiography;  Surgeon: Adrian Prows, MD;  Location: [facility];  Service: Cardiovascular;  Laterality: N/A;  . CARDIAC CATHETERIZATION N/A 12/04/2015   Procedure: Intravascular Pressure Wire/FFR Study;  Surgeon: Adrian Prows, MD;  Location: [facility] [facility];  Service: Cardiovascular;  Laterality: N/A;  . CARDIAC CATHETERIZATION N/A 12/07/2015   Procedure: Coronary Stent Intervention;  Surgeon: Adrian Prows, MD;  Location: [facility];  Service: Cardiovascular;  Laterality: N/A;  . EYE MUSCLE SURGERY Left 1969  . LAPAROSCOPIC CHOLECYSTECTOMY  2008  . TUBAL LIGATION Bilateral 1995  . UPPER GASTROINTESTINAL ENDOSCOPY    . VENTRICULAR ABLATION SURGERY  2006,2010   times 2, follow-up for v-tack    Current Outpatient Prescriptions  Medication Sig Dispense Refill  . amLODipine (NORVASC) 5 MG tablet Take 1 tablet (5 mg total) by mouth daily. 30 tablet 1  . aspirin EC 81 MG tablet Take 81 mg by mouth daily.    Marland Kitchen atenolol (TENORMIN) 25 MG tablet Take 25 mg by mouth daily.    . Cholecalciferol (VITAMIN D) 2000 units tablet Take 2,000 Units by mouth daily.    . ferrous sulfate 325 (65 FE) MG tablet Take 1 tablet (325 mg total) by mouth daily with breakfast. Take with food 30 tablet 3  . lisinopril (PRINIVIL,ZESTRIL) 40 MG tablet Take 40 mg by mouth daily.    . nitroGLYCERIN (NITROSTAT) 0.4 MG SL tablet Place 1 tablet (0.4 mg total) under the tongue every 5 (five) minutes as needed for chest pain. 25 tablet 1  . pantoprazole (PROTONIX) 40 MG tablet Take 1 tablet (40 mg total) by mouth daily at 12 noon. 30 tablet 0  . Pitavastatin Calcium (LIVALO) 2 MG TABS Take 1 tablet (2 mg total) by mouth daily. (Patient taking differently: Take 1 tablet by mouth daily at 6 PM. ) 30  tablet 11  . Polyethyl Glycol-Propyl Glycol (SYSTANE OP) Place 1 drop into both eyes daily as needed (dry eyes).    . prasugrel (EFFIENT) 10 MG TABS tablet Take 1 tablet (10 mg total) by mouth daily. 30 tablet 1   No current facility-administered medications for this visit.     Family History  Problem Relation Age of Onset  . Hypertension Father   . Prostate cancer Father   . Heart attack Father   . Colon cancer Father 7  . Hypertension Mother   . Diabetes Mother   . Crohn's disease Daughter   . Heart attack Cousin   . Diabetes Maternal Grandmother   . Cancer Sister     ovarian  . Diabetes Brother   . Esophageal cancer Neg Hx   . Rectal  cancer Neg Hx   . Stomach cancer Neg Hx     ROS:  Pertinent items are noted in HPI.  Otherwise, a comprehensive ROS was negative.  Exam:   LMP 05/28/2016    Ht Readings from Last 3 Encounters:  06/08/16 5\' 2"  (1.575 m)  05/25/16 5\' 2"  (1.575 m)  05/22/16 5\' 2"  (1.575 m)    General appearance: alert, cooperative and appears stated age Head: Normocephalic, without obvious abnormality, atraumatic Neck: no adenopathy, supple, symmetrical, trachea midline and thyroid {EXAM; THYROID:18604} Lungs: clear to auscultation bilaterally Breasts: {Exam; breast:13139::"normal appearance, no masses or tenderness"} Heart: regular rate and rhythm Abdomen: soft, non-tender; no masses,  no organomegaly Extremities: extremities normal, atraumatic, no cyanosis or edema Skin: Skin color, texture, turgor normal. No rashes or lesions Lymph nodes: Cervical, supraclavicular, and axillary nodes normal. No abnormal inguinal nodes palpated Neurologic: Grossly normal   Pelvic: External genitalia:  no lesions              Urethra:  normal appearing urethra with no masses, tenderness or lesions              Bartholin's and Skene's: normal                 Vagina: normal appearing vagina with normal color and discharge, no lesions              Cervix: {exam; cervix:14595}              Pap taken: {yes no:314532} Bimanual Exam:  Uterus:  {exam; uterus:12215}              Adnexa: {exam; adnexa:12223}               Rectovaginal: Confirms               Anus:  normal sphincter tone, no lesions  Chaperone present: ***  A:  Well Woman with normal exam  P:   Reviewed health and wellness pertinent to exam  Pap smear as above  {plan; gyn:5269::"mammogram","pap smear","return annually or prn"}  An After Visit Summary was printed and given to the patient.

## 2016-07-12 ENCOUNTER — Ambulatory Visit: Payer: Commercial Managed Care - PPO | Admitting: Gastroenterology

## 2016-09-07 ENCOUNTER — Encounter: Payer: Self-pay | Admitting: Obstetrics and Gynecology

## 2016-10-03 ENCOUNTER — Ambulatory Visit (INDEPENDENT_AMBULATORY_CARE_PROVIDER_SITE_OTHER): Payer: Commercial Managed Care - PPO | Admitting: Medical

## 2016-10-03 ENCOUNTER — Encounter: Payer: Self-pay | Admitting: Medical

## 2016-10-03 VITALS — BP 117/73 | HR 84 | Temp 99.4°F | Resp 16 | Ht 62.0 in | Wt 165.0 lb

## 2016-10-03 DIAGNOSIS — R05 Cough: Secondary | ICD-10-CM

## 2016-10-03 DIAGNOSIS — R059 Cough, unspecified: Secondary | ICD-10-CM

## 2016-10-03 DIAGNOSIS — J0100 Acute maxillary sinusitis, unspecified: Secondary | ICD-10-CM | POA: Diagnosis not present

## 2016-10-03 DIAGNOSIS — H6692 Otitis media, unspecified, left ear: Secondary | ICD-10-CM

## 2016-10-03 DIAGNOSIS — M791 Myalgia, unspecified site: Secondary | ICD-10-CM

## 2016-10-03 MED ORDER — AMOXICILLIN-POT CLAVULANATE 875-125 MG PO TABS: 1.0000 | ORAL_TABLET | Freq: Two times a day (BID) | ORAL | 0 refills | Status: DC

## 2016-10-03 MED ORDER — BENZONATATE 100 MG PO CAPS: 100.0000 mg | ORAL_CAPSULE | Freq: Three times a day (TID) | ORAL | 0 refills | Status: DC | PRN

## 2016-10-03 MED ORDER — OSELTAMIVIR PHOSPHATE 75 MG PO CAPS: 75.0000 mg | ORAL_CAPSULE | Freq: Two times a day (BID) | ORAL | 0 refills | Status: DC

## 2016-10-03 MED ORDER — FLUTICASONE PROPIONATE 50 MCG/ACT NA SUSP: 2.0000 | Freq: Every day | NASAL | 1 refills | Status: DC

## 2016-10-03 NOTE — Progress Notes (Signed)
Pre visit review using our clinic review tool, if applicable. No additional management support is needed unless otherwise documented below in the visit note/SLS  

## 2016-10-03 NOTE — Patient Instructions (Addendum)
  You appear to have a sinus infection and lt OM. I am prescribing antibiotic augmentin for the infection. To help with the nasal congestion I prescribed flonase nasal steroid. For your associated cough, I prescribed cough medicine benzonatate.  For recent bodyaches and flu like onset this weekend rx tamiflu. I don't have flu test in office today but think best to start tamiflu.  Rest, hydrate, tylenol for fever.  Follow up in 7 days or as needed.

## 2016-10-03 NOTE — Progress Notes (Signed)
Subjective:    Patient ID: [name], female    DOB: 11/27/1965, 51 y.o.   MRN: [id number]  HPI  Pt in for head/nasal congestion, ear pain ,st,  and productive cough for 3 days. Saturday had fever and bodyaches(back, shoulders and legs). Pt has felt fevers and chills.  Pt has been taking tylenol.  Pt states chest congestion. She has stents but denies chest type pain. Declines ekg offer.On discussion she states has chest congestion and is sure not chest pain from heart as she states she has had before.   Pt works Designer, fashion/clothing at [employer].   Review of Systems  Constitutional: Positive for chills, fatigue and fever.  HENT: Positive for congestion, ear pain, sinus pain and sinus pressure. Negative for postnasal drip.   Respiratory: Positive for cough. Negative for chest tightness, shortness of breath and wheezing.   Cardiovascular: Negative for chest pain and palpitations.  Gastrointestinal: Negative for abdominal pain.  Musculoskeletal: Positive for myalgias.  Skin: Negative for rash.  Neurological: Negative for dizziness and light-headedness.  Hematological: Negative for adenopathy. Does not bruise/bleed easily.  Psychiatric/Behavioral: Negative for behavioral problems and confusion. The patient is not nervous/anxious.     Past Medical History:  Diagnosis Date  . Acute pancreatitis 2011  . Allergy   . Anemia   . CAD (coronary artery disease) 2009   Dr Olegario Shearer -[facility], Heart Cath  . Family history of adverse reaction to anesthesia    "mother and brother have difficulty waking up out from it"  . GERD (gastroesophageal reflux disease)   . Hyperlipidemia   . Hypertension   . Hypothyroidism    "recently dx'd" (12/07/2015)  . PVC (premature ventricular contraction)   . Statin intolerance   . STD (sexually transmitted disease)    CHL 20 yrs ago  . Thyroid nodule 2010   "precancerous; thyroid treated with radiation"  . Tubular adenoma of colon 06/2015       Social History   Social History  . Marital status: Divorced    Spouse name: N/A  . Number of children: 4  . 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 Smoking status: Never Smoker  . Smokeless tobacco: Never Used  . Alcohol use No  . Drug use: No  . Sexual activity: Not Currently    Partners: Male    Birth control/ protection: Surgical, Abstinence     Comment: BTL   Other Topics Concern  . Not on file   Social History Narrative   FAMILY HISTORY   History of hypertension   History of prostate cancer 1st degree relative <50   D, S ovar. CA      Regular exercise - YES      GYN Dr Posey Pronto - [place]    Past Surgical History:  Procedure Laterality Date  . CARDIAC CATHETERIZATION     unable to do cardiac cath but does stress echo 11/22/11  . CARDIAC CATHETERIZATION N/A 12/04/2015   Procedure: Left Heart Cath and Coronary Angiography;  Surgeon: Adrian Prows, MD;  Location: [facility];  Service: Cardiovascular;  Laterality: N/A;  . CARDIAC CATHETERIZATION N/A 12/04/2015   Procedure: Intravascular Pressure Wire/FFR Study;  Surgeon: Adrian Prows, MD;  Location: [facility];  Service: Cardiovascular;  Laterality: N/A;  . CARDIAC CATHETERIZATION N/A 12/07/2015   Procedure: Coronary Stent Intervention;  Surgeon: Adrian Prows, MD;  Location: [facility];  Service: Cardiovascular;  Laterality: N/A;  . EYE MUSCLE SURGERY Left 1969  . LAPAROSCOPIC CHOLECYSTECTOMY  2008  . TUBAL LIGATION Bilateral 1995  . UPPER GASTROINTESTINAL ENDOSCOPY    . VENTRICULAR ABLATION SURGERY  2006,2010   times 2, follow-up for v-tack    Family History  Problem Relation Age of Onset  . Hypertension Father   . Prostate cancer Father   . Heart attack Father   . Colon cancer Father 50  . Hypertension Mother   . Diabetes Mother   . Crohn's disease Daughter   . Heart attack Cousin   .  Diabetes Maternal Grandmother   . Cancer Sister     ovarian  . Diabetes Brother   . Esophageal cancer Neg Hx   . Rectal cancer Neg Hx   . Stomach cancer Neg Hx     Allergies  Allergen Reactions  . Crestor [Rosuvastatin Calcium] Other (See Comments)    Muscle aches and pains  . Lipitor [Atorvastatin] Other (See Comments)    Muscle aches and pains    Current Outpatient Prescriptions on File Prior to Visit  Medication Sig Dispense Refill  . amLODipine (NORVASC) 5 MG tablet Take 1 tablet (5 mg total) by mouth daily. 30 tablet 1  . aspirin EC 81 MG tablet Take 81 mg by mouth daily.    Marland Kitchen atenolol (TENORMIN) 25 MG tablet Take 25 mg by mouth daily.    . Cholecalciferol (VITAMIN D) 2000 units tablet Take 2,000 Units by mouth daily.    . ferrous sulfate 325 (65 FE) MG tablet Take 1 tablet (325 mg total) by mouth daily with breakfast. Take with food 30 tablet 3  . lisinopril (PRINIVIL,ZESTRIL) 40 MG tablet Take 40 mg by mouth daily.    . nitroGLYCERIN (NITROSTAT) 0.4 MG SL tablet Place 1 tablet (0.4 mg total) under the tongue every 5 (five) minutes as needed for chest pain. 25 tablet 1  . pantoprazole (PROTONIX) 40 MG tablet Take 1 tablet (40 mg total) by mouth daily at 12 noon. 30 tablet 0  . Pitavastatin Calcium (LIVALO) 2 MG TABS Take 1 tablet (2 mg total) by mouth daily. (Patient taking differently: Take 1 tablet by mouth daily at 6 PM. ) 30 tablet 11  . Polyethyl Glycol-Propyl Glycol (SYSTANE OP) Place 1 drop into both eyes daily as needed (dry eyes).    . prasugrel (EFFIENT) 10 MG TABS tablet Take 1 tablet (10 mg total) by mouth daily. 30 tablet 1   No current facility-administered medications on file prior to visit.     BP 117/73 (BP Location: Right Arm, Patient Position: Sitting, Cuff Size: Large)   Pulse 84   Temp 99.4 F (37.4 C) (Oral)   Resp 16   Ht 5\' 2"  (1.575 m)   Wt 165 lb (74.8 kg)   LMP 10/22/2015   SpO2 100%   BMI 30.18 kg/m       Objective:   Physical  Exam  General  Mental Status - Alert. General Appearance - Well groomed. Not in acute distress.  Skin Rashes- No Rashes.  HEENT Head- Normal. Ear Auditory Canal - Left- Normal. Right - Normal.Tympanic Membrane- Left- Normal. Right- Normal. Eye Sclera/Conjunctiva- Left- Normal. Right- Normal. Nose & Sinuses Nasal Mucosa- Left-  Boggy and Congested. Right-  Boggy and  Congested.Bilateral maxillary and frontal sinus pressure. Mouth & Throat Lips: Upper Lip- Normal: no dryness, cracking, pallor, cyanosis, or vesicular eruption. Lower  Lip-Normal: no dryness, cracking, pallor, cyanosis or vesicular eruption. Buccal Mucosa- Bilateral- No Aphthous ulcers. Oropharynx- No Discharge or Erythema. Tonsils: Characteristics- Bilateral- No Erythema or Congestion. Size/Enlargement- Bilateral- No enlargement. Discharge- bilateral-None.  Neck Neck- Supple. No Masses.   Chest and Lung Exam Auscultation: Breath Sounds:-Clear even and unlabored.  Cardiovascular Auscultation:Rythm- Regular, rate and rhythm. Murmurs & Other Heart Sounds:Ausculatation of the heart reveal- No Murmurs.  Lymphatic Head & Neck General Head & Neck Lymphatics: Bilateral: Description- No Localized lymphadenopathy.       Assessment & Plan:  You appear to have a sinus infection and lt OM. I am prescribing antibiotic augmentin for the infection. To help with the nasal congestion I prescribed flonase nasal steroid. For your associated cough, I prescribed cough medicine benzonatate.  For recent bodyaches and flu like onset this weekend rx tamiflu. I don't have flu test in office today but think best to start tamiflu.  Rest, hydrate, tylenol for fever.  Pt declined ekg. Offered today due to history of stent.  Follow up in 7 days or as needed.

## 2016-10-27 ENCOUNTER — Ambulatory Visit: Payer: Commercial Managed Care - PPO | Admitting: Medical

## 2016-10-28 ENCOUNTER — Ambulatory Visit (INDEPENDENT_AMBULATORY_CARE_PROVIDER_SITE_OTHER): Payer: Commercial Managed Care - PPO | Admitting: Medical

## 2016-10-28 ENCOUNTER — Other Ambulatory Visit: Payer: Self-pay | Admitting: Medical

## 2016-10-28 ENCOUNTER — Other Ambulatory Visit (HOSPITAL_COMMUNITY)
Admission: RE | Admit: 2016-10-28 | Discharge: 2016-10-28 | Disposition: A | Discharge status: Home / Self Care | Payer: Commercial Managed Care - PPO | Source: Ambulatory Visit | Attending: Medical | Admitting: Medical

## 2016-10-28 ENCOUNTER — Encounter: Payer: Self-pay | Admitting: Medical

## 2016-10-28 VITALS — BP 110/65 | HR 57 | Temp 97.9°F | Resp 16 | Ht 62.0 in | Wt 165.2 lb

## 2016-10-28 DIAGNOSIS — N898 Other specified noninflammatory disorders of vagina: Secondary | ICD-10-CM

## 2016-10-28 DIAGNOSIS — L298 Other pruritus: Secondary | ICD-10-CM

## 2016-10-28 DIAGNOSIS — R102 Pelvic and perineal pain: Secondary | ICD-10-CM

## 2016-10-28 DIAGNOSIS — R399 Unspecified symptoms and signs involving the genitourinary system: Secondary | ICD-10-CM

## 2016-10-28 DIAGNOSIS — L299 Pruritus, unspecified: Secondary | ICD-10-CM | POA: Insufficient documentation

## 2016-10-28 LAB — POC URINALSYSI DIPSTICK (AUTOMATED)
BILIRUBIN UA: NEGATIVE
Blood, UA: POSITIVE
Glucose, UA: NEGATIVE
KETONES UA: NEGATIVE
Nitrite, UA: POSITIVE
Protein, UA: POSITIVE
Spec Grav, UA: >=1.03
Urobilinogen, UA: 1
pH, UA: 6

## 2016-10-28 MED ORDER — FLUCONAZOLE 150 MG PO TABS: ORAL_TABLET | ORAL | 0 refills | Status: DC

## 2016-10-28 MED ORDER — CIPROFLOXACIN HCL 500 MG PO TABS: 500.0000 mg | ORAL_TABLET | Freq: Two times a day (BID) | ORAL | 0 refills | Status: DC

## 2016-10-28 NOTE — Patient Instructions (Addendum)
You do have signs and symptom of uti and your urine looks suspicious for infection. Possible yeast infection as well since recent use antibiotic and mild itch.  Will get urine culture and ancillary studies.   Will rx cipro and diflucan since if yeast infection present then antibiotic would worsen. Use probiotic.  We will notify you urine results as they come in.  Follow up in 7 days or as needed  Also remember to hydrate well.  If back pain worsens, with worse uti symptoms then ED evaluation.

## 2016-10-28 NOTE — Progress Notes (Signed)
Subjective:    Patient ID: [name], female    DOB: 1965/12/05, 51 y.o.   MRN: [id number]  HPI  Pt in with some symptoms of burning and itching initially about a week ago. Pt was treated with some antibiotics recently for sinus infection. Pt describes some faint left lower back pain 2-3 days ago. Tylenol helps with pain. Some increased frequency. No fever, chills, or sweats. Pt not using any probiotic.   LMP- October 17, 2016.    Review of Systems  Constitutional: Negative for chills, fatigue and fever.  HENT: Negative for sinus pain and sinus pressure.   Respiratory: Negative for chest tightness, shortness of breath and wheezing.   Cardiovascular: Negative for chest pain and palpitations.  Gastrointestinal: Negative for abdominal pain.  Genitourinary: Positive for dysuria and frequency. Negative for decreased urine volume, difficulty urinating, urgency and vaginal pain.       Maybe white dc. Cloudy urine as well.  Mild vaginal itch.  Musculoskeletal: Positive for back pain.       Faint lt lower back pain/cva area.  Skin: Negative for rash.  Hematological: Negative for adenopathy. Does not bruise/bleed easily.  Psychiatric/Behavioral: Negative for confusion.    Past Medical History:  Diagnosis Date  . Acute pancreatitis 2011  . Allergy   . Anemia   . CAD (coronary artery disease) 2009   Dr Olegario Shearer -[facility], Heart Cath  . Family history of adverse reaction to anesthesia    "mother and brother have difficulty waking up out from it"  . GERD (gastroesophageal reflux disease)   . Hyperlipidemia   . Hypertension   . Hypothyroidism    "recently dx'd" (12/07/2015)  . PVC (premature ventricular contraction)   . Statin intolerance   . STD (sexually transmitted disease)    CHL 20 yrs ago  . Thyroid nodule 2010   "precancerous; thyroid treated with radiation"  . Tubular adenoma of colon 06/2015     Social History   Social History  . Marital status: Divorced    Spouse name: N/A  . Number of children: 4  . Years of education: N/A   Occupational History  . DISPATCHER [employer]  . Student     F/T   Social History Main Topics  . Smoking status: Never Smoker  . Smokeless tobacco: Never Used  . Alcohol use No  . Drug use: No  . Sexual activity: Not Currently    Partners: Male    Birth control/ protection: Surgical, Abstinence     Comment: BTL   Other Topics Concern  . Not on file   Social History Narrative   FAMILY HISTORY   History of hypertension   History of prostate cancer 1st degree relative <50   D, S ovar. CA      Regular exercise - YES      GYN Dr Posey Pronto - [place]    Past Surgical History:  Procedure Laterality Date  . CARDIAC CATHETERIZATION     unable to do cardiac cath but does stress echo 11/22/11  . CARDIAC CATHETERIZATION N/A 12/04/2015   Procedure: Left Heart Cath and Coronary Angiography;  Surgeon: Adrian Prows, MD;  Location: [facility];  Service: Cardiovascular;  Laterality: N/A;  . CARDIAC CATHETERIZATION N/A 12/04/2015   Procedure: Intravascular Pressure Wire/FFR Study;  Surgeon: Adrian Prows, MD;  Location: [facility];  Service: Cardiovascular;  Laterality: N/A;  . CARDIAC CATHETERIZATION N/A 12/07/2015   Procedure: Coronary Stent Intervention;  Surgeon: Ulice Dash  Einar Gip, MD;  Location: [facility];  Service: Cardiovascular;  Laterality: N/A;  . EYE MUSCLE SURGERY Left 1969  . LAPAROSCOPIC CHOLECYSTECTOMY  2008  . TUBAL LIGATION Bilateral 1995  . UPPER GASTROINTESTINAL ENDOSCOPY    . VENTRICULAR ABLATION SURGERY  2006,2010   times 2, follow-up for v-tack    Family History  Problem Relation Age of Onset  . Hypertension Father   . Prostate cancer Father   . Heart attack Father   . Colon cancer Father 10  . Hypertension Mother   . Diabetes Mother   . Crohn's disease Daughter   . Heart attack Cousin   . Diabetes Maternal Grandmother   . Cancer Sister     ovarian  . Diabetes  Brother   . Esophageal cancer Neg Hx   . Rectal cancer Neg Hx   . Stomach cancer Neg Hx     Allergies  Allergen Reactions  . Crestor [Rosuvastatin Calcium] Other (See Comments)    Muscle aches and pains  . Lipitor [Atorvastatin] Other (See Comments)    Muscle aches and pains    Current Outpatient Prescriptions on File Prior to Visit  Medication Sig Dispense Refill  . amLODipine (NORVASC) 5 MG tablet Take 1 tablet (5 mg total) by mouth daily. 30 tablet 1  . atenolol (TENORMIN) 25 MG tablet Take 25 mg by mouth daily.    . Cholecalciferol (VITAMIN D) 2000 units tablet Take 2,000 Units by mouth daily.    . fluticasone (FLONASE) 50 MCG/ACT nasal spray Place 2 sprays into both nostrils daily. (Patient taking differently: Place 2 sprays into both nostrils daily as needed. ) 16 g 1  . lisinopril (PRINIVIL,ZESTRIL) 40 MG tablet Take 40 mg by mouth daily.    . nitroGLYCERIN (NITROSTAT) 0.4 MG SL tablet Place 1 tablet (0.4 mg total) under the tongue every 5 (five) minutes as needed for chest pain. 25 tablet 1  . pantoprazole (PROTONIX) 40 MG tablet Take 1 tablet (40 mg total) by mouth daily at 12 noon. 30 tablet 0  . Pitavastatin Calcium (LIVALO) 2 MG TABS Take 1 tablet (2 mg total) by mouth daily. (Patient taking differently: Take 1 tablet by mouth daily at 6 PM. ) 30 tablet 11  . Polyethyl Glycol-Propyl Glycol (SYSTANE OP) Place 1 drop into both eyes daily as needed (dry eyes).    . prasugrel (EFFIENT) 10 MG TABS tablet Take 1 tablet (10 mg total) by mouth daily. 30 tablet 1  . aspirin EC 81 MG tablet Take 81 mg by mouth daily.    . ferrous sulfate 325 (65 FE) MG tablet Take 1 tablet (325 mg total) by mouth daily with breakfast. Take with food (Patient not taking: Reported on 10/28/2016) 30 tablet 3   No current facility-administered medications on file prior to visit.     BP 110/65 (BP Location: Right Arm, Patient Position: Sitting, Cuff Size: Large)   Pulse (!) 57   Temp 97.9 F (36.6 C)  (Oral)   Resp 16   Ht 5\' 2"  (1.575 m)   Wt 165 lb 4 oz (75 kg)   LMP 10/17/2016   SpO2 100%   BMI 30.22 kg/m       Objective:   Physical Exam  General Appearance- Not in acute distress.  HEENT Eyes- Scleraeral/Conjuntiva-bilat- Not Yellow. Mouth & Throat- Normal.  Chest and Lung Exam Auscultation: Breath sounds:-Normal. Adventitious sounds:- No Adventitious sounds.  Cardiovascular Auscultation:Rythm - Regular. Heart Sounds -Normal heart sounds.  Abdomen Inspection:-Inspection Normal.  Palpation/Perucssion: Palpation and Percussion of the abdomen reveal- faint suprapubic Tender, No Rebound tenderness, No rigidity(Guarding) and No Palpable abdominal masses.  Liver:-Normal.  Spleen:- Normal.   Back- mild lt cva tenderness        Assessment & Plan:  ou do have signs and symptom of uti and your urine looks suspicious for infection. Possible yeast infection as well since recent use antibiotic and mild itch.  Will get urine culture and ancillary studies.   Will rx cipro and diflucan since if yeast infection present  Then antibiotic would worsen. Use probiotic.  We will notify you urine results as they come in.  Follow up in 7 days or as needed  Also remember to hydrate well.  If back pain worsens, with worse uti symptoms then ED evaluation.  Kawthar Ennen, Percell Miller, PA-C

## 2016-10-28 NOTE — Progress Notes (Signed)
Pre visit review using our clinic review tool, if applicable. No additional management support is needed unless otherwise documented below in the visit note/SLS  

## 2016-10-29 LAB — NEISSERIA GONORRHOEAE, PROBE AMP: GC Probe RNA: NOT DETECTED

## 2016-10-30 LAB — CULTURE, URINE COMPREHENSIVE

## 2016-10-31 LAB — URINE CYTOLOGY ANCILLARY ONLY: Trichomonas: NEGATIVE

## 2016-11-01 LAB — URINE CYTOLOGY ANCILLARY ONLY
Bacterial vaginitis: NEGATIVE
CANDIDA VAGINITIS: NEGATIVE

## 2016-11-02 ENCOUNTER — Encounter: Payer: Self-pay | Admitting: *Deleted

## 2016-11-02 NOTE — Progress Notes (Signed)
Normal results; letter mailed/SLS 02/07

## 2016-11-05 LAB — CHLAMYDIA TRACHOMATIS, PROBE AMP: CT PROBE, AMP APTIMA: NOT DETECTED

## 2017-03-07 IMAGING — DX DG CHEST 2V
2 series · 2 of 2 positions shown · non-contrast
Comparison: 04/03/2012

CLINICAL DATA: Left-sided chest pain today. Shortness of breath,
dizziness, tachycardia.

EXAM:
CHEST  2 VIEW

[chest pa]
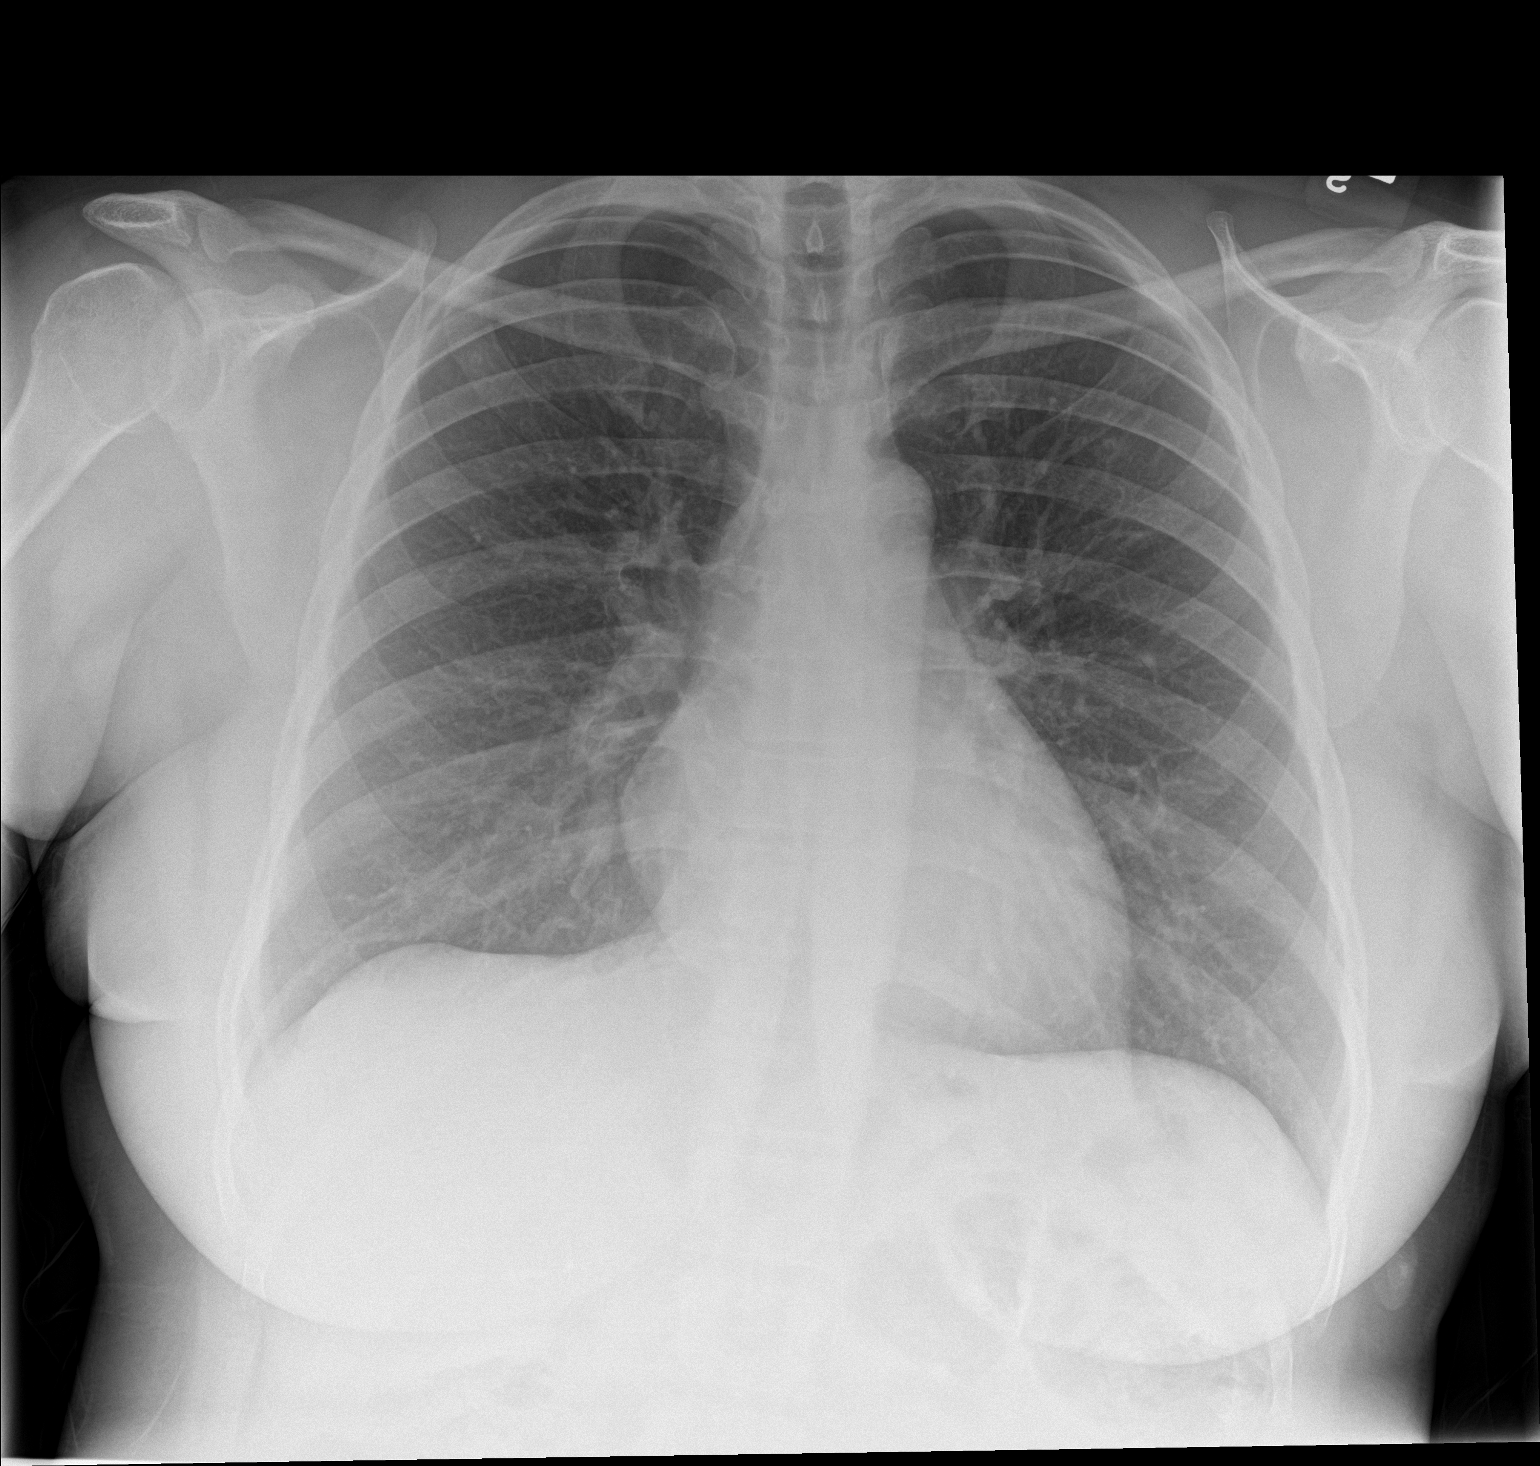

[chest lat]
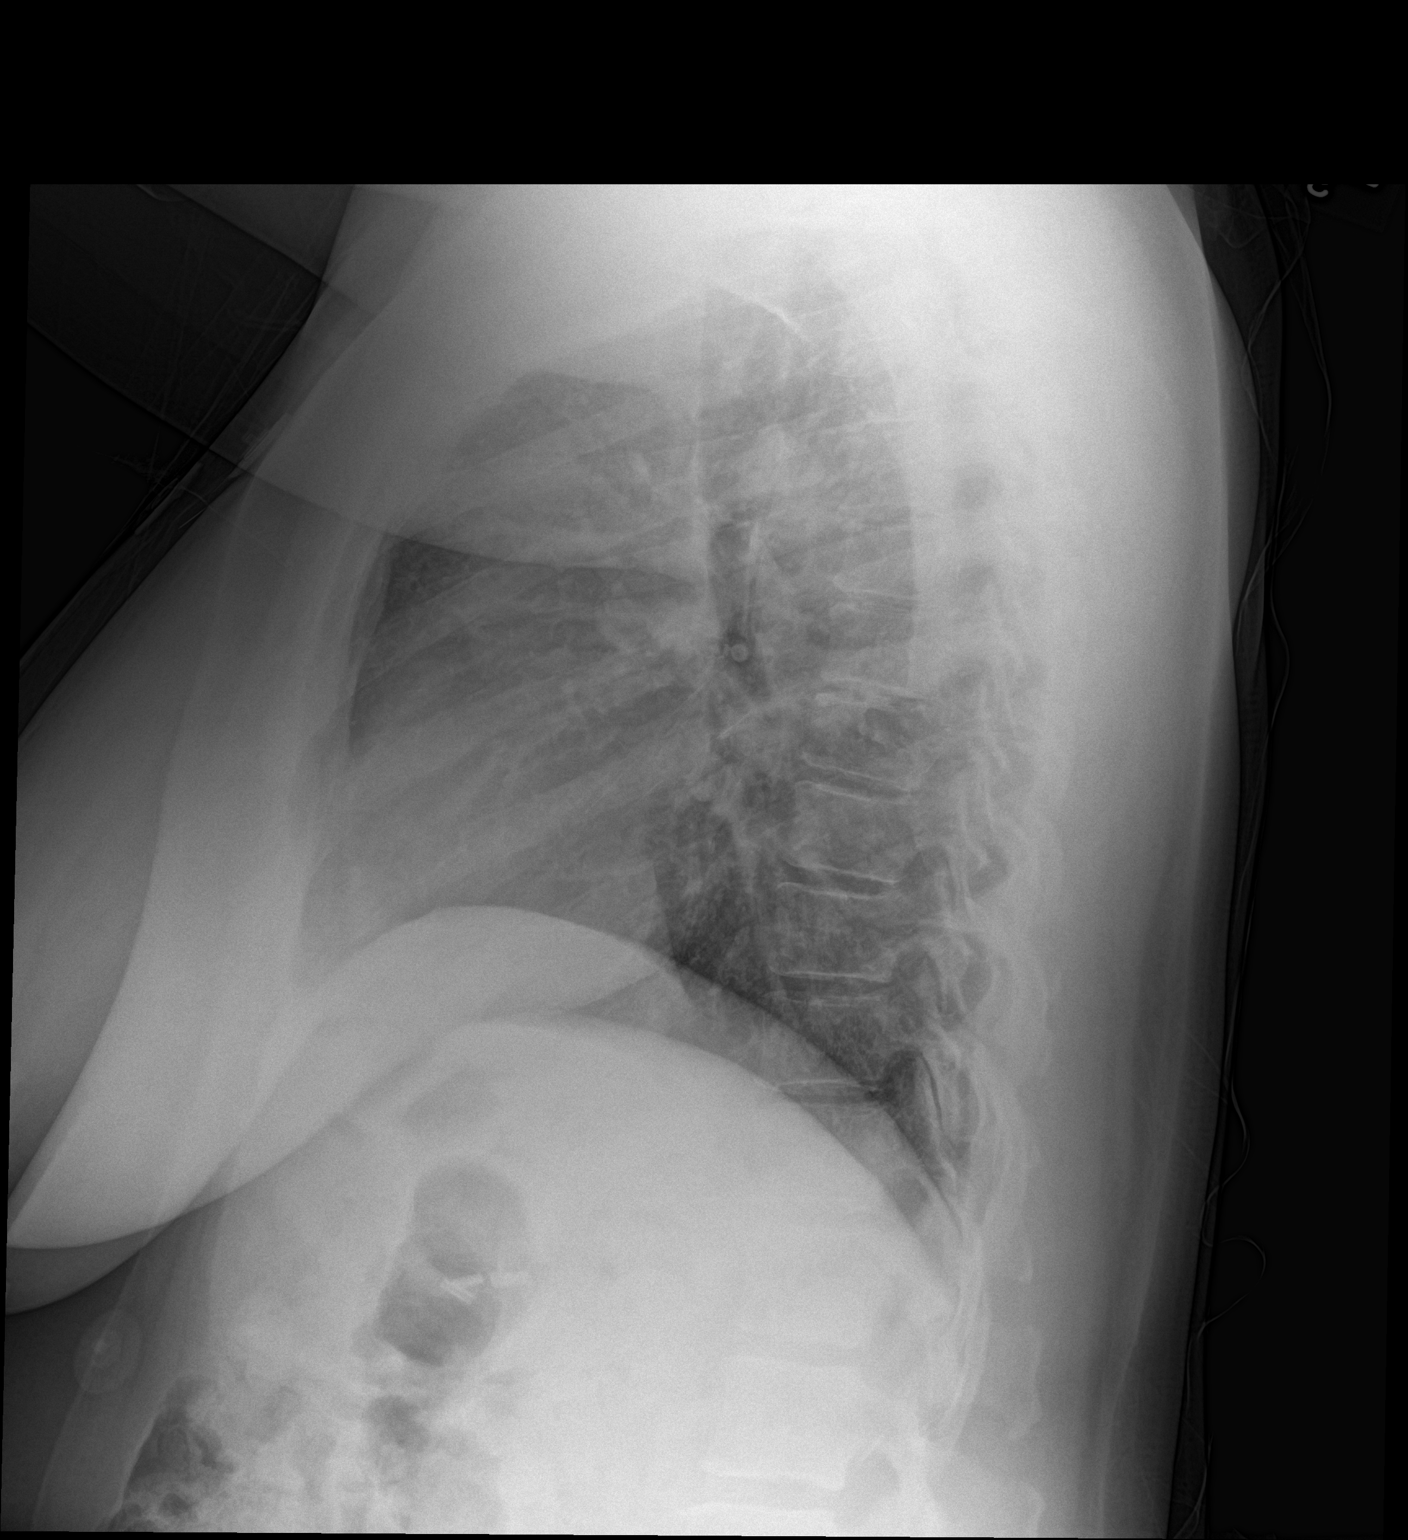

[2 of 2 positions shown; findings below may reference images not displayed]

FINDINGS: The cardiomediastinal contours are normal. The lungs are clear.
Pulmonary vasculature is normal. No consolidation, pleural effusion,
or pneumothorax. No acute osseous abnormalities are seen.
IMPRESSION: No acute pulmonary process.

## 2017-04-04 ENCOUNTER — Telehealth: Payer: Self-pay | Admitting: Internal Medicine

## 2017-04-04 ENCOUNTER — Telehealth: Payer: Self-pay

## 2017-04-04 NOTE — Telephone Encounter (Signed)
Pt called back regarding her refill of this med, can it be sent in for her since Inez Catalina is gone for the day? Thanks

## 2017-04-04 NOTE — Telephone Encounter (Signed)
Pt called in and said that she needs a refill on her lisinopril (PRINIVIL,ZESTRIL) 40 MG tablet [[id number]]    Pharmacy - on file  Pt has an appt on the 16th

## 2017-04-04 NOTE — Telephone Encounter (Signed)
error 

## 2017-04-04 NOTE — Telephone Encounter (Signed)
Routing to dr plotnikov---i'm not showing you as prescriber for this med----please advise, thanks

## 2017-04-04 NOTE — Telephone Encounter (Signed)
Ok x 12 mo Thx 

## 2017-04-05 MED ORDER — LISINOPRIL 40 MG PO TABS: 40.0000 mg | ORAL_TABLET | Freq: Every day | ORAL | 3 refills | Status: DC

## 2017-04-05 NOTE — Telephone Encounter (Signed)
RX sent

## 2017-04-10 ENCOUNTER — Encounter: Payer: Self-pay | Admitting: Internal Medicine

## 2017-04-10 ENCOUNTER — Ambulatory Visit (INDEPENDENT_AMBULATORY_CARE_PROVIDER_SITE_OTHER): Payer: Commercial Managed Care - PPO | Admitting: Internal Medicine

## 2017-04-10 VITALS — BP 130/84 | HR 67 | Temp 97.7°F | Ht 62.0 in | Wt 169.0 lb

## 2017-04-10 DIAGNOSIS — E785 Hyperlipidemia, unspecified: Secondary | ICD-10-CM | POA: Diagnosis not present

## 2017-04-10 DIAGNOSIS — R002 Palpitations: Secondary | ICD-10-CM | POA: Diagnosis not present

## 2017-04-10 DIAGNOSIS — Z23 Encounter for immunization: Secondary | ICD-10-CM

## 2017-04-10 DIAGNOSIS — I10 Essential (primary) hypertension: Secondary | ICD-10-CM | POA: Diagnosis not present

## 2017-04-10 DIAGNOSIS — I2510 Atherosclerotic heart disease of native coronary artery without angina pectoris: Secondary | ICD-10-CM

## 2017-04-10 MED ORDER — AMLODIPINE BESYLATE 5 MG PO TABS: 5.0000 mg | ORAL_TABLET | Freq: Every day | ORAL | 11 refills | Status: DC

## 2017-04-10 MED ORDER — PITAVASTATIN CALCIUM 2 MG PO TABS: 1.0000 | ORAL_TABLET | Freq: Every day | ORAL | 11 refills | Status: DC

## 2017-04-10 NOTE — Assessment & Plan Note (Signed)
Restart Norvasc

## 2017-04-10 NOTE — Assessment & Plan Note (Signed)
LAD PTCA/STENT by Dr Einar Gip 11/2015 ASA ASA, Effient, Livalo

## 2017-04-10 NOTE — Addendum Note (Signed)
Addended by: Karren Cobble on: 04/10/2017 04:13 PM   Modules accepted: Orders

## 2017-04-10 NOTE — Progress Notes (Signed)
Subjective:  Patient ID: [name], female    DOB: [birth year]  Age: 51 y.o. MRN: [id number]  CC: No chief complaint on file.   HPI [name] presents for HTN, dyslipidemia, CAD f/u. She had an LAD PTCA/STENT by Dr Einar Gip 11/2015 C/o elevated BP  - ran out of Amlodipine   Outpatient Medications Prior to Visit  Medication Sig Dispense Refill  . amLODipine (NORVASC) 5 MG tablet Take 1 tablet (5 mg total) by mouth daily. 30 tablet 1  . aspirin EC 81 MG tablet Take 81 mg by mouth daily.    Marland Kitchen atenolol (TENORMIN) 25 MG tablet Take 25 mg by mouth daily.    . Cholecalciferol (VITAMIN D) 2000 units tablet Take 2,000 Units by mouth daily.    Marland Kitchen lisinopril (PRINIVIL,ZESTRIL) 40 MG tablet Take 1 tablet (40 mg total) by mouth daily. 90 tablet 3  . nitroGLYCERIN (NITROSTAT) 0.4 MG SL tablet Place 1 tablet (0.4 mg total) under the tongue every 5 (five) minutes as needed for chest pain. 25 tablet 1  . Pitavastatin Calcium (LIVALO) 2 MG TABS Take 1 tablet (2 mg total) by mouth daily. (Patient taking differently: Take 1 tablet by mouth daily at 6 PM. ) 30 tablet 11  . Polyethyl Glycol-Propyl Glycol (SYSTANE OP) Place 1 drop into both eyes daily as needed (dry eyes).    . prasugrel (EFFIENT) 10 MG TABS tablet Take 1 tablet (10 mg total) by mouth daily. 30 tablet 1  . ciprofloxacin (CIPRO) 500 MG tablet Take 1 tablet (500 mg total) by mouth 2 (two) times daily. 14 tablet 0  . ferrous sulfate 325 (65 FE) MG tablet Take 1 tablet (325 mg total) by mouth daily with breakfast. Take with food (Patient not taking: Reported on 10/28/2016) 30 tablet 3  . fluconazole (DIFLUCAN) 150 MG tablet 1 tab po day 1, 2nd tab po day 4, 3rd tab po day 7. 3 tablet 0  . fluticasone (FLONASE) 50 MCG/ACT nasal spray Place 2 sprays into both nostrils daily. (Patient taking differently: Place 2 sprays into both nostrils daily as needed. ) 16 g 1  . pantoprazole (PROTONIX) 40 MG tablet Take 1 tablet (40 mg total) by mouth daily  at 12 noon. 30 tablet 0   No facility-administered medications prior to visit.     ROS Review of Systems  Constitutional: Negative for activity change, appetite change, chills, fatigue and unexpected weight change.  HENT: Negative for congestion, mouth sores and sinus pressure.   Eyes: Negative for visual disturbance.  Respiratory: Negative for cough and chest tightness.   Gastrointestinal: Negative for abdominal pain and nausea.  Genitourinary: Negative for difficulty urinating, frequency and vaginal pain.  Musculoskeletal: Negative for back pain and gait problem.  Skin: Negative for pallor and rash.  Neurological: Positive for dizziness. Negative for tremors, weakness, numbness and headaches.  Psychiatric/Behavioral: Negative for confusion and sleep disturbance.    Objective:  BP 130/84 (BP Location: Right Arm, Patient Position: Sitting, Cuff Size: Large)   Pulse 67   Temp 97.7 F (36.5 C) (Oral)   Ht 5\' 2"  (1.575 m)   Wt 169 lb (76.7 kg)   SpO2 99%   BMI 30.91 kg/m   BP Readings from Last 3 Encounters:  04/10/17 130/84  10/28/16 110/65  10/03/16 117/73    Wt Readings from Last 3 Encounters:  04/10/17 169 lb (76.7 kg)  10/28/16 165 lb 4 oz (75 kg)  10/03/16 165 lb (74.8 kg)    Physical  Exam  Constitutional: She appears well-developed. No distress.  HENT:  Head: Normocephalic.  Right Ear: External ear normal.  Left Ear: External ear normal.  Nose: Nose normal.  Mouth/Throat: Oropharynx is clear and moist.  Eyes: Pupils are equal, round, and reactive to light. Conjunctivae are normal. Right eye exhibits no discharge. Left eye exhibits no discharge.  Neck: Normal range of motion. Neck supple. No JVD present. No tracheal deviation present. No thyromegaly present.  Cardiovascular: Normal rate, regular rhythm and normal heart sounds.   Pulmonary/Chest: No stridor. No respiratory distress. She has no wheezes.  Abdominal: Soft. Bowel sounds are normal. She exhibits no  distension and no mass. There is no tenderness. There is no rebound and no guarding.  Musculoskeletal: She exhibits no edema or tenderness.  Lymphadenopathy:    She has no cervical adenopathy.  Neurological: She displays normal reflexes. No cranial nerve deficit. She exhibits normal muscle tone. Coordination normal.  Skin: No rash noted. No erythema.  Psychiatric: She has a normal mood and affect. Her behavior is normal. Judgment and thought content normal.    Lab Results  Component Value Date   WBC 7.9 05/22/2016   HGB 10.1 (L) 05/22/2016   HCT 32.6 (L) 05/22/2016   PLT 300 05/22/2016   GLUCOSE 80 05/22/2016   CHOL 212 (H) 12/04/2015   TRIG 89 12/04/2015   HDL 55 12/04/2015   LDLCALC 139 (H) 12/04/2015   ALT 14 05/22/2016   AST 17 05/22/2016   NA 134 (L) 05/22/2016   K 4.1 05/22/2016   CL 104 05/22/2016   CREATININE 0.84 05/22/2016   BUN 10 05/22/2016   CO2 25 05/22/2016   TSH 5.11 (H) 12/28/2015   INR 1.15 05/22/2016   HGBA1C 5.6 12/04/2015    No results found.  Assessment & Plan:   There are no diagnoses linked to this encounter. I have discontinued [name]'s pantoprazole, ferrous sulfate, fluticasone, ciprofloxacin, and fluconazole. I am also having her maintain her atenolol, amLODipine, nitroGLYCERIN, prasugrel, Pitavastatin Calcium, aspirin EC, Vitamin D, Polyethyl Glycol-Propyl Glycol (SYSTANE OP), and lisinopril.  No orders of the defined types were placed in this encounter.    Follow-up: No Follow-up on file.  Walker Kehr, MD

## 2017-04-11 ENCOUNTER — Telehealth: Payer: Self-pay | Admitting: Internal Medicine

## 2017-04-11 NOTE — Telephone Encounter (Signed)
Pt called stating that the prescription for Pitavastatin Calcium (LIVALO) 2 MG TABS is requiring a prior authorization. Fax is in Dr [employer] box.

## 2017-04-13 ENCOUNTER — Telehealth: Payer: Self-pay

## 2017-04-13 NOTE — Telephone Encounter (Signed)
PA started KEY: [facility]

## 2017-04-13 NOTE — Telephone Encounter (Signed)
Pa started 

## 2017-04-14 NOTE — Telephone Encounter (Signed)
PA approved and pharmacy contacted

## 2017-04-21 ENCOUNTER — Other Ambulatory Visit (INDEPENDENT_AMBULATORY_CARE_PROVIDER_SITE_OTHER): Payer: Commercial Managed Care - PPO

## 2017-04-21 DIAGNOSIS — I10 Essential (primary) hypertension: Secondary | ICD-10-CM

## 2017-04-21 DIAGNOSIS — I2510 Atherosclerotic heart disease of native coronary artery without angina pectoris: Secondary | ICD-10-CM | POA: Diagnosis not present

## 2017-04-21 DIAGNOSIS — R002 Palpitations: Secondary | ICD-10-CM | POA: Diagnosis not present

## 2017-04-21 LAB — HEPATIC FUNCTION PANEL
ALBUMIN: 3.7 g/dL (ref 3.5–5.2)
ALK PHOS: 38 U/L — AB (ref 39–117)
ALT: 8 U/L (ref 0–35)
AST: 11 U/L (ref 0–37)
BILIRUBIN DIRECT: 0.1 mg/dL (ref 0.0–0.3)
TOTAL PROTEIN: 7.2 g/dL (ref 6.0–8.3)
Total Bilirubin: 0.4 mg/dL (ref 0.2–1.2)

## 2017-04-21 LAB — CBC WITH DIFFERENTIAL/PLATELET
Basophils Absolute: 0 10*3/uL (ref 0.0–0.1)
Basophils Relative: 0.7 % (ref 0.0–3.0)
EOS ABS: 0.1 10*3/uL (ref 0.0–0.7)
EOS PCT: 2.1 % (ref 0.0–5.0)
HCT: 37.4 % (ref 36.0–46.0)
HEMOGLOBIN: 12.1 g/dL (ref 12.0–15.0)
LYMPHS ABS: 2.1 10*3/uL (ref 0.7–4.0)
Lymphocytes Relative: 34.5 % (ref 12.0–46.0)
MCHC: 32.5 g/dL (ref 30.0–36.0)
MCV: 93.4 fl (ref 78.0–100.0)
MONO ABS: 0.5 10*3/uL (ref 0.1–1.0)
Monocytes Relative: 9 % (ref 3.0–12.0)
NEUTROS PCT: 53.7 % (ref 43.0–77.0)
Neutro Abs: 3.2 10*3/uL (ref 1.4–7.7)
Platelets: 269 10*3/uL (ref 150.0–400.0)
RBC: 4 Mil/uL (ref 3.87–5.11)
RDW: 13.8 % (ref 11.5–15.5)
WBC: 6 10*3/uL (ref 4.0–10.5)

## 2017-04-21 LAB — TSH: TSH: 8.16 u[IU]/mL — AB (ref 0.35–4.50)

## 2017-04-21 LAB — BASIC METABOLIC PANEL
BUN: 10 mg/dL (ref 6–23)
CALCIUM: 9 mg/dL (ref 8.4–10.5)
CO2: 25 mEq/L (ref 19–32)
CREATININE: 0.81 mg/dL (ref 0.40–1.20)
Chloride: 107 mEq/L (ref 96–112)
GFR: 95.85 mL/min (ref >60.00)
Glucose, Bld: 104 mg/dL — ABNORMAL HIGH (ref 70–99)
Potassium: 4.6 mEq/L (ref 3.5–5.1)
Sodium: 138 mEq/L (ref 135–145)

## 2017-04-21 LAB — LIPID PANEL
CHOLESTEROL: 147 mg/dL (ref 0–200)
HDL: 57.8 mg/dL (ref >39.00)
LDL CALC: 75 mg/dL (ref 0–99)
NonHDL: 89.01
TRIGLYCERIDES: 71 mg/dL (ref 0.0–149.0)
Total CHOL/HDL Ratio: 3
VLDL: 14.2 mg/dL (ref 0.0–40.0)

## 2017-04-21 LAB — T4, FREE: Free T4: 0.66 ng/dL (ref 0.60–1.60)

## 2017-05-23 ENCOUNTER — Telehealth: Payer: Self-pay | Admitting: Internal Medicine

## 2017-05-23 MED ORDER — ATENOLOL 25 MG PO TABS: 25.0000 mg | ORAL_TABLET | Freq: Every day | ORAL | 0 refills | Status: DC

## 2017-05-23 NOTE — Telephone Encounter (Signed)
Pt called requesting a refill on her atenolol (TENORMIN) 25 MG tablet to [facility].

## 2017-05-23 NOTE — Telephone Encounter (Signed)
Reviewed chart pt is up-to-date sent refills to [facility],,,/[facility]

## 2017-08-18 ENCOUNTER — Other Ambulatory Visit: Payer: Self-pay | Admitting: Internal Medicine

## 2017-11-10 ENCOUNTER — Ambulatory Visit: Payer: Commercial Managed Care - PPO | Admitting: Internal Medicine

## 2017-11-14 ENCOUNTER — Ambulatory Visit: Payer: Self-pay | Admitting: Internal Medicine

## 2017-11-14 DIAGNOSIS — Z0289 Encounter for other administrative examinations: Secondary | ICD-10-CM

## 2017-11-24 ENCOUNTER — Emergency Department (HOSPITAL_COMMUNITY): Payer: PRIVATE HEALTH INSURANCE

## 2017-11-24 ENCOUNTER — Other Ambulatory Visit: Payer: Self-pay

## 2017-11-24 ENCOUNTER — Observation Stay (HOSPITAL_COMMUNITY)
Admission: EM | Admit: 2017-11-24 | Discharge: 2017-11-24 | Disposition: A | Discharge status: Home / Self Care | Payer: PRIVATE HEALTH INSURANCE | Attending: Cardiology | Admitting: Cardiology

## 2017-11-24 ENCOUNTER — Encounter (HOSPITAL_COMMUNITY)
Admission: EM | Disposition: A | Discharge status: Home / Self Care | Payer: Self-pay | Source: Home / Self Care | Attending: Emergency Medicine

## 2017-11-24 ENCOUNTER — Encounter (HOSPITAL_COMMUNITY): Payer: Self-pay

## 2017-11-24 DIAGNOSIS — I10 Essential (primary) hypertension: Secondary | ICD-10-CM | POA: Insufficient documentation

## 2017-11-24 DIAGNOSIS — Z7982 Long term (current) use of aspirin: Secondary | ICD-10-CM | POA: Diagnosis not present

## 2017-11-24 DIAGNOSIS — I200 Unstable angina: Secondary | ICD-10-CM | POA: Diagnosis present

## 2017-11-24 DIAGNOSIS — E785 Hyperlipidemia, unspecified: Secondary | ICD-10-CM | POA: Diagnosis not present

## 2017-11-24 DIAGNOSIS — I25110 Atherosclerotic heart disease of native coronary artery with unstable angina pectoris: Secondary | ICD-10-CM | POA: Diagnosis present

## 2017-11-24 DIAGNOSIS — E039 Hypothyroidism, unspecified: Secondary | ICD-10-CM | POA: Diagnosis not present

## 2017-11-24 DIAGNOSIS — Z955 Presence of coronary angioplasty implant and graft: Secondary | ICD-10-CM | POA: Diagnosis not present

## 2017-11-24 DIAGNOSIS — Z79899 Other long term (current) drug therapy: Secondary | ICD-10-CM | POA: Diagnosis not present

## 2017-11-24 DIAGNOSIS — R0789 Other chest pain: Secondary | ICD-10-CM | POA: Diagnosis present

## 2017-11-24 HISTORY — PX: LEFT HEART CATH AND CORONARY ANGIOGRAPHY: CATH118249

## 2017-11-24 LAB — BASIC METABOLIC PANEL
ANION GAP: 11 (ref 5–15)
BUN: 8 mg/dL (ref 6–20)
CALCIUM: 9.4 mg/dL (ref 8.9–10.3)
CHLORIDE: 105 mmol/L (ref 101–111)
CO2: 23 mmol/L (ref 22–32)
Creatinine, Ser: 0.78 mg/dL (ref 0.44–1.00)
GFR calc Af Amer: >60 mL/min (ref >60)
GFR calc non Af Amer: >60 mL/min (ref >60)
GLUCOSE: 74 mg/dL (ref 65–99)
Potassium: 4.2 mmol/L (ref 3.5–5.1)
Sodium: 139 mmol/L (ref 135–145)

## 2017-11-24 LAB — CBC
HCT: 38 % (ref 36.0–46.0)
HEMOGLOBIN: 12.2 g/dL (ref 12.0–15.0)
MCH: 29.4 pg (ref 26.0–34.0)
MCHC: 32.1 g/dL (ref 30.0–36.0)
MCV: 91.6 fL (ref 78.0–100.0)
Platelets: 300 10*3/uL (ref 150–400)
RBC: 4.15 MIL/uL (ref 3.87–5.11)
RDW: 14.7 % (ref 11.5–15.5)
WBC: 6.1 10*3/uL (ref 4.0–10.5)

## 2017-11-24 LAB — TROPONIN I

## 2017-11-24 LAB — I-STAT TROPONIN, ED
Troponin i, poc: 0 ng/mL (ref 0.00–0.08)
Troponin i, poc: 0 ng/mL (ref 0.00–0.08)

## 2017-11-24 LAB — D-DIMER, QUANTITATIVE (NOT AT ARMC): D DIMER QUANT: 0.74 ug{FEU}/mL — AB (ref 0.00–0.50)

## 2017-11-24 SURGERY — LEFT HEART CATH AND CORONARY ANGIOGRAPHY
Anesthesia: LOCAL

## 2017-11-24 MED ORDER — SODIUM CHLORIDE 0.9 % IV SOLN: 250.0000 mL | INTRAVENOUS | Status: DC | PRN

## 2017-11-24 MED ORDER — HEPARIN (PORCINE) IN NACL 100-0.45 UNIT/ML-% IJ SOLN
800.0000 [IU]/h | INTRAMUSCULAR | Status: DC
  Administered 2017-11-24: 800 [IU]/h via INTRAVENOUS
  Filled 2017-11-24: qty 250

## 2017-11-24 MED ORDER — LIDOCAINE HCL (PF) 1 % IJ SOLN
INTRAMUSCULAR | Status: AC
  Filled 2017-11-24: qty 30

## 2017-11-24 MED ORDER — NITROGLYCERIN IN D5W 200-5 MCG/ML-% IV SOLN
0.0000 ug/min | Freq: Once | INTRAVENOUS | Status: AC
  Administered 2017-11-24: 5 ug/min via INTRAVENOUS
  Filled 2017-11-24: qty 250

## 2017-11-24 MED ORDER — ASPIRIN 81 MG PO CHEW: 324.0000 mg | CHEWABLE_TABLET | ORAL | Status: DC

## 2017-11-24 MED ORDER — SODIUM CHLORIDE 0.9 % IV SOLN
INTRAVENOUS | Status: AC
  Administered 2017-11-24: 18:00:00 via INTRAVENOUS

## 2017-11-24 MED ORDER — MIDAZOLAM HCL 2 MG/2ML IJ SOLN
INTRAMUSCULAR | Status: DC | PRN
  Administered 2017-11-24: 1 mg via INTRAVENOUS

## 2017-11-24 MED ORDER — NITROGLYCERIN 0.4 MG SL SUBL: 0.4000 mg | SUBLINGUAL_TABLET | SUBLINGUAL | Status: DC | PRN

## 2017-11-24 MED ORDER — NITROGLYCERIN 2 % TD OINT: 1.0000 [in_us] | TOPICAL_OINTMENT | Freq: Once | TRANSDERMAL | Status: DC

## 2017-11-24 MED ORDER — HEPARIN SODIUM (PORCINE) 1000 UNIT/ML IJ SOLN
INTRAMUSCULAR | Status: AC
  Filled 2017-11-24: qty 1

## 2017-11-24 MED ORDER — LIDOCAINE HCL (PF) 1 % IJ SOLN
INTRAMUSCULAR | Status: DC | PRN
  Administered 2017-11-24: 2 mL

## 2017-11-24 MED ORDER — SODIUM CHLORIDE 0.9% FLUSH: 3.0000 mL | INTRAVENOUS | Status: DC | PRN

## 2017-11-24 MED ORDER — GI COCKTAIL ~~LOC~~
30.0000 mL | Freq: Once | ORAL | Status: AC
  Administered 2017-11-24: 30 mL via ORAL
  Filled 2017-11-24: qty 30

## 2017-11-24 MED ORDER — ONDANSETRON HCL 4 MG/2ML IJ SOLN: 4.0000 mg | Freq: Four times a day (QID) | INTRAMUSCULAR | Status: DC | PRN

## 2017-11-24 MED ORDER — ONDANSETRON 4 MG PO TBDP
4.0000 mg | ORAL_TABLET | Freq: Once | ORAL | Status: AC
  Administered 2017-11-24: 4 mg via ORAL
  Filled 2017-11-24: qty 1

## 2017-11-24 MED ORDER — VERAPAMIL HCL 2.5 MG/ML IV SOLN
INTRAVENOUS | Status: AC
  Filled 2017-11-24: qty 2

## 2017-11-24 MED ORDER — ASPIRIN EC 81 MG PO TBEC: 81.0000 mg | DELAYED_RELEASE_TABLET | Freq: Every day | ORAL | Status: DC

## 2017-11-24 MED ORDER — ACETAMINOPHEN 325 MG PO TABS: 650.0000 mg | ORAL_TABLET | ORAL | Status: DC | PRN

## 2017-11-24 MED ORDER — GI COCKTAIL ~~LOC~~
30.0000 mL | Freq: Once | ORAL | Status: AC
Start: 2017-11-24 — End: 2017-11-24
  Administered 2017-11-24: 30 mL via ORAL
  Filled 2017-11-24: qty 30

## 2017-11-24 MED ORDER — HEPARIN (PORCINE) IN NACL 2-0.9 UNIT/ML-% IJ SOLN
INTRAMUSCULAR | Status: AC | PRN
  Administered 2017-11-24 (×2): 500 mL via INTRA_ARTERIAL

## 2017-11-24 MED ORDER — VERAPAMIL HCL 2.5 MG/ML IV SOLN
INTRAVENOUS | Status: DC | PRN
  Administered 2017-11-24: 17:00:00 via INTRA_ARTERIAL

## 2017-11-24 MED ORDER — MIDAZOLAM HCL 2 MG/2ML IJ SOLN
INTRAMUSCULAR | Status: AC
  Filled 2017-11-24: qty 2

## 2017-11-24 MED ORDER — IOPAMIDOL (ISOVUE-370) INJECTION 76%
INTRAVENOUS | Status: DC | PRN
  Administered 2017-11-24: 70 mL via INTRA_ARTERIAL

## 2017-11-24 MED ORDER — IOPAMIDOL (ISOVUE-370) INJECTION 76%
INTRAVENOUS | Status: AC
  Filled 2017-11-24: qty 100

## 2017-11-24 MED ORDER — FENTANYL CITRATE (PF) 100 MCG/2ML IJ SOLN
INTRAMUSCULAR | Status: AC
Start: 2017-11-24 — End: ?
  Filled 2017-11-24: qty 2

## 2017-11-24 MED ORDER — ASPIRIN 81 MG PO CHEW: 81.0000 mg | CHEWABLE_TABLET | ORAL | Status: DC

## 2017-11-24 MED ORDER — HEPARIN (PORCINE) IN NACL 2-0.9 UNIT/ML-% IJ SOLN
INTRAMUSCULAR | Status: AC
  Filled 2017-11-24: qty 500

## 2017-11-24 MED ORDER — ASPIRIN 81 MG PO CHEW
243.0000 mg | CHEWABLE_TABLET | Freq: Once | ORAL | Status: AC
  Administered 2017-11-24: 243 mg via ORAL
  Filled 2017-11-24: qty 3

## 2017-11-24 MED ORDER — SODIUM CHLORIDE 0.9% FLUSH
3.0000 mL | Freq: Two times a day (BID) | INTRAVENOUS | Status: DC
  Administered 2017-11-24: 3 mL via INTRAVENOUS

## 2017-11-24 MED ORDER — HEPARIN BOLUS VIA INFUSION
3000.0000 [IU] | Freq: Once | INTRAVENOUS | Status: AC
Start: 2017-11-24 — End: 2017-11-24
  Administered 2017-11-24: 3000 [IU] via INTRAVENOUS
  Filled 2017-11-24: qty 3000

## 2017-11-24 MED ORDER — SODIUM CHLORIDE 0.9% FLUSH: 3.0000 mL | Freq: Two times a day (BID) | INTRAVENOUS | Status: DC

## 2017-11-24 MED ORDER — SODIUM CHLORIDE 0.9 % IV SOLN: INTRAVENOUS | Status: AC

## 2017-11-24 MED ORDER — HEPARIN SODIUM (PORCINE) 1000 UNIT/ML IJ SOLN
INTRAMUSCULAR | Status: DC | PRN
  Administered 2017-11-24: 3500 [IU] via INTRAVENOUS

## 2017-11-24 MED ORDER — ASPIRIN 300 MG RE SUPP: 300.0000 mg | RECTAL | Status: DC

## 2017-11-24 MED ORDER — FENTANYL CITRATE (PF) 100 MCG/2ML IJ SOLN
INTRAMUSCULAR | Status: DC | PRN
  Administered 2017-11-24: 50 ug via INTRAVENOUS

## 2017-11-24 SURGICAL SUPPLY — 10 items
CATH IMPULSE 5F ANG/FL3.5 (CATHETERS) ×2 IMPLANT
DEVICE RAD COMP TR BAND LRG (VASCULAR PRODUCTS) ×2 IMPLANT
GLIDESHEATH SLEND A-KIT 6F 22G (SHEATH) ×2 IMPLANT
GUIDEWIRE INQWIRE 1.5J.035X260 (WIRE) ×1 IMPLANT
INQWIRE 1.5J .035X260CM (WIRE) ×2
KIT HEART LEFT (KITS) ×2 IMPLANT
PACK CARDIAC CATHETERIZATION (CUSTOM PROCEDURE TRAY) ×2 IMPLANT
SYR MEDRAD MARK V 150ML (SYRINGE) ×4 IMPLANT
TRANSDUCER W/STOPCOCK (MISCELLANEOUS) ×2 IMPLANT
TUBING CIL FLEX 10 FLL-RA (TUBING) ×2 IMPLANT

## 2017-11-24 NOTE — Discharge Instructions (Signed)
Acute Coronary Syndrome °Acute coronary syndrome (ACS) is a serious problem in which there is suddenly not enough blood and oxygen reaching the heart. ACS can result in chest pain or a heart attack. °What are the causes? °This condition may be caused by: °· A buildup of fat and cholesterol inside of the arteries (atherosclerosis). This is the most common cause. The buildup (plaque) can cause the blood vessels in your heart (coronary arteries) to become narrow or blocked. Plaque can also break off to form a clot. °· A coronary spasm. °· A tearing of the coronary artery (spontaneous coronary artery dissection). °· Low blood pressure (hypotension). °· An abnormal heart beat (arrhythmia). °· Using cocaine or methamphetamine. ° °What increases the risk? °The following factors may make you more likely to develop this condition: °· Age. °· History of chest pain, heart attack, or stroke. °· Being female. °· Family history of chest pain, heart disease, or stroke. °· Smoking. °· Inactivity. °· Being overweight. °· High cholesterol. °· High blood pressure (hypertension). °· Diabetes. °· Excessive alcohol use. ° °What are the signs or symptoms? °Common symptoms of this condition include: °· Chest pain. The pain may last long, or may stop and come back (recur). It may feel like: °? Crushing or squeezing. °? Tightness, pressure, fullness, or heaviness. °· Arm, neck, jaw, or back pain. °· Heartburn or indigestion. °· Shortness of breath. °· Nausea. °· Sudden cold sweats. °· Lightheadedness. °· Dizziness. °· Tiredness (fatigue). ° °Sometimes there are no symptoms. °How is this diagnosed? °This condition may be diagnosed through: °· An electrocardiogram (ECG). This test records the impulses of the heart. °· Blood tests. °· A CT scan of the chest. °· A coronary angiogram. This procedure checks for a blockage in the coronary arteries. ° °How is this treated? °Treatment for this condition may include: °· Oxygen. °· Medicines, such  as: °? Antiplatelet medicines and blood-thinning medicines, such as aspirin. These help prevent blood clots. °? Fibrinolytic therapy. This breaks apart a blood clot. °? Blood pressure medicines. °? Nitroglycerin. °? Pain medicine. °? Cholesterol medicine. °· A procedure called coronary angioplasty and stenting. This is done to widen a narrowed artery and keep it open. °· Coronary artery bypass surgery. This allows blood to pass the blockage to reach your heart. °· Cardiac rehabilitation. This is a program that helps improve your health and well-being. It includes exercise training, education, and counseling to help you recover. ° °Follow these instructions at home: °Eating and drinking °· Follow a heart-healthy, low-salt (sodium) diet. °· Use healthy cooking methods such as roasting, grilling, broiling, baking, poaching, steaming, or stir-frying. °· Talk to a dietitian to learn about healthy cooking methods and how to eat less sodium. °Medicines °· Take over-the-counter and prescription medicines only as told by your health care provider. °· Do not take these medicines unless your health care provider approves: °? Nonsteroidal anti-inflammatory drugs (NSAIDs), such as ibuprofen, naproxen, or celecoxib. °? Vitamin supplements that contain vitamin A or vitamin E. °? Hormone replacement therapy that contains estrogen. °Activity °· Join a cardiac rehabilitation program. °· Ask your health care provider: °? What activities and exercises are safe for you. °? If you should follow specific instructions about lifting, driving, or climbing stairs. °· If you are taking aspirin and another blood thinning medicine, avoid activities that are likely to result in an injury. The medicines can increase your risk of bleeding. °Lifestyle °· Do not use any products that contain nicotine or tobacco, such as cigarettes   and e-cigarettes. If you need help quitting, ask your health care provider. °· If you drink alcohol and your health care  provider says it is okay to drink, limit your alcohol intake to no more than 1 drink per day. One drink equals 12 oz of beer, 5 oz of wine, or 1½ oz of hard liquor. °· Maintain a healthy weight. If you need to lose weight, do it in a way that has been approved by your health care provider. °General instructions °· Tell all your health care providers about your heart condition, including your dentist. Some medicines can increase your risk of arrhythmia. °· Manage other health conditions, such as hypertension and diabetes. These conditions affect your heart. °· Learn ways to manage stress. °· Get screened for depression, and seek treatment if needed. °· Monitor your blood pressure if told by your health care provider. °· Keep your vaccinations up to date. Get the annual influenza vaccine. °· Keep all follow-up visits as told by your health care provider. This is important. °Contact a health care provider if: °· You feel overwhelmed or sad. °· You have trouble with your daily activities. °Get help right away if: °· You have pain in your chest, neck, arm, jaw, stomach, or back that recurs, and: °? Lasts more than a few minutes. °? Is not relieved by taking the medicineyour health care provider prescribed. °· You have unexplained: °? Heavy sweating. °? Heartburn or indigestion. °? Shortness of breath. °? Difficulty breathing. °? Nausea or vomiting. °? Fatigue. °? Nervousness or anxiety. °? Weakness. °? Diarrhea. °? Dark stools or blood in the stool. °· You have sudden lightheadedness or dizziness. °· Your blood pressure is higher than 180/120 °· You faint. °· You feel like hurting yourself or think about taking your own life. °These symptoms may represent a serious problem that is an emergency. Do not wait to see if the symptoms will go away. Get medical help right away. Call your local emergency services (911 in the [place] Do not drive yourself to the clinic or hospital. °Summary °· Acute coronary syndrome (ACS) is a  when there is not enough blood and oxygen being supplied to the heart. ACS can result in chest pain or a heart attack. °· Acute coronary syndrome is a medical emergency. If you have any symptoms of this condition, get help right away. °· Treatment includes oxygen, medicines, and procedures to open the blocked arteries and restore blood flow. °This information is not intended to replace advice given to you by your health care provider. Make sure you discuss any questions you have with your health care provider. °Document Released: 09/12/2005 Document Revised: 10/14/2016 Document Reviewed: 10/14/2016 °Elsevier Interactive Patient Education © 2018 [employer] ° °

## 2017-11-24 NOTE — ED Triage Notes (Signed)
Patient complains of CP and epigastric pain since last night. Took 1 SL ntg last night with relief and this am the pain returned. On assessment alert and oriented, VSS. Did not take any NTG today

## 2017-11-24 NOTE — ED Provider Notes (Addendum)
[place] EMERGENCY DEPARTMENT Provider Note   CSN: [id number] Arrival date & time: 11/24/17  1040     History   Chief Complaint Chief Complaint  Patient presents with  . Chest Pain  . Abdominal Pain    HPI  [name] is a 52 y.o. Female with a history of CAD with stent placed in 2017, hypertension, hyperlipidemia, GERD, hypothyroidism, who presents to the ED for evaluation of left-sided chest pain.  Patient reports chest pain initially began last night she describes it as left-sided chest pain and pressure, took one sublingual nitroglycerin last night and pain eased off and she was able to get sleep, but pain returned this morning and has persisted throughout the day, patient came here from work due to worsening of chest pain.  Patient continues to have active chest pain here in the ED, which radiates to the left arm, no radiation to the neck or jaw, mild associated shortness of breath, no lightheadedness or syncope.  She has not taken any additional doses of nitroglycerin, or anything else to treat her symptoms prior to arrival.  He reports pain is worse with exertion, pain is not pleuritic, denies any other aggravating or alleviating factors.  Patient reports some mild epigastric pain and nausea, no episodes of emesis.  Patient reports she is recently been seeing Dr. Percival Spanish and [facility] for cardiology, but her stent was placed by Dr. Einar Gip here, last echo in 2017 showed EF of 56%. Denies exogenous estrogen use, lower extremity pain or swelling, recent travel or immobilization, history of PE or DVT, family or personal history of bleeding or clotting disorders, cough or hemoptysis.      Past Medical History:  Diagnosis Date  . Acute pancreatitis 2011  . Allergy   . Anemia   . CAD (coronary artery disease) 2009   Dr Olegario Shearer -[facility], Heart Cath  . Family history of adverse reaction to anesthesia    "mother and brother have difficulty waking up out  from it"  . GERD (gastroesophageal reflux disease)   . Hyperlipidemia   . Hypertension   . Hypothyroidism    "recently dx'd" (12/07/2015)  . PVC (premature ventricular contraction)   . Statin intolerance   . STD (sexually transmitted disease)    CHL 20 yrs ago  . Thyroid nodule 2010   "precancerous; thyroid treated with radiation"  . Tubular adenoma of colon 06/2015    Patient Active Problem List   Diagnosis Date Noted  . Palpitations 12/28/2015  . CAD (coronary artery disease), native coronary artery 12/06/2015  . Chest pain 12/04/2015  . Epigastric abdominal pain 12/04/2015  . Bladder pain 05/14/2015  . Incomplete bladder emptying 05/14/2015  . Occipital neuralgia of left side 11/14/2013  . Aspiration into respiratory tract 04/03/2012  . Cough due to bronchospasm 04/03/2012  . Chest pain, atypical 04/03/2012  . HYPERGLYCEMIA 06/16/2010  . BACK PAIN, LUMBAR 05/27/2009  . THYROID NODULE, RIGHT 05/04/2009  . HYPERTHYROIDISM 05/04/2009  . Hyperlipidemia with target LDL less than 70 12/31/2008  . H/O acute pancreatitis 12/31/2008  . LEG PAIN, BILATERAL 12/31/2008  . PARESTHESIA 12/31/2008  . EDEMA 12/31/2008  . RLQ abdominal pain 10/02/2007  . GERD 09/17/2007  . DYSPNEA 08/28/2007  . Dysphagia 08/28/2007  . Essential hypertension 04/24/2007    Past Surgical History:  Procedure Laterality Date  . CARDIAC CATHETERIZATION     unable to do cardiac cath but does stress echo 11/22/11  . CARDIAC CATHETERIZATION N/A 12/04/2015   Procedure:  Left Heart Cath and Coronary Angiography;  Surgeon: Adrian Prows, MD;  Location: [place];  Service: Cardiovascular;  Laterality: N/A;  . CARDIAC CATHETERIZATION N/A 12/04/2015   Procedure: Intravascular Pressure Wire/FFR Study;  Surgeon: Adrian Prows, MD;  Location: [place] [facility];  Service: Cardiovascular;  Laterality: N/A;  . CARDIAC CATHETERIZATION N/A 12/07/2015   Procedure: Coronary Stent Intervention;  Surgeon: Adrian Prows, MD;   Location: [place] [facility];  Service: Cardiovascular;  Laterality: N/A;  . EYE MUSCLE SURGERY Left 1969  . LAPAROSCOPIC CHOLECYSTECTOMY  2008  . TUBAL LIGATION Bilateral 1995  . UPPER GASTROINTESTINAL ENDOSCOPY    . VENTRICULAR ABLATION SURGERY  2006,2010   times 2, follow-up for v-tack    OB History    Gravida Para Term Preterm AB Living   4 4 4     4    SAB TAB Ectopic Multiple Live Births           4       Home Medications    Prior to Admission medications   Medication Sig Start Date End Date Taking? Authorizing Provider  amLODipine (NORVASC) 5 MG tablet Take 1 tablet (5 mg total) by mouth daily. 04/10/17   Plotnikov, Evie Lacks, MD  aspirin EC 81 MG tablet Take 81 mg by mouth daily.    [provider]  atenolol (TENORMIN) 25 MG tablet TAKE 1 TABLET (25 MG TOTAL) BY MOUTH DAILY. 08/20/17   Plotnikov, Evie Lacks, MD  Cholecalciferol (VITAMIN D) 2000 units tablet Take 2,000 Units by mouth daily.    [provider]  lisinopril (PRINIVIL,ZESTRIL) 40 MG tablet Take 1 tablet (40 mg total) by mouth daily. 04/05/17   Plotnikov, Evie Lacks, MD  nitroGLYCERIN (NITROSTAT) 0.4 MG SL tablet Place 1 tablet (0.4 mg total) under the tongue every 5 (five) minutes as needed for chest pain. 12/08/15   Neldon Labella, NP  Pitavastatin Calcium (LIVALO) 2 MG TABS Take 1 tablet (2 mg total) by mouth daily. 04/10/17   Plotnikov, Evie Lacks, MD  Polyethyl Glycol-Propyl Glycol (SYSTANE OP) Place 1 drop into both eyes daily as needed (dry eyes).    [provider]  prasugrel (EFFIENT) 10 MG TABS tablet Take 1 tablet (10 mg total) by mouth daily. 12/08/15   Neldon Labella, NP    Family History Family History  Problem Relation Age of Onset  . Hypertension Father   . Prostate cancer Father   . Heart attack Father   . Colon cancer Father 36  . Hypertension Mother   . Diabetes Mother   . Crohn's disease Daughter   . Heart attack Cousin   . Diabetes Maternal Grandmother   .  Cancer Sister        ovarian  . Diabetes Brother   . Esophageal cancer Neg Hx   . Rectal cancer Neg Hx   . Stomach cancer Neg Hx     Social History Social History   Tobacco Use  . Smoking status: Never Smoker  . Smokeless tobacco: Never Used  Substance Use Topics  . Alcohol use: No  . Drug use: No     Allergies   Crestor [rosuvastatin calcium] and Lipitor [atorvastatin]   Review of Systems Review of Systems  Constitutional: Negative for chills and fever.  HENT: Negative for congestion, rhinorrhea and sore throat.   Eyes: Negative for visual disturbance.  Respiratory: Positive for shortness of breath. Negative for cough and chest tightness.   Cardiovascular: Positive for chest pain. Negative for  palpitations and leg swelling.  Gastrointestinal: Positive for abdominal pain (Epigastric) and nausea. Negative for blood in stool, constipation, diarrhea and vomiting.  Genitourinary: Negative for dysuria and frequency.  Musculoskeletal: Negative for arthralgias and joint swelling.  Skin: Negative for color change and rash.  Neurological: Negative for dizziness and light-headedness.     Physical Exam Updated Vital Signs BP (!) 138/93   Pulse (!) 56   Temp 98 F (36.7 C) (Oral)   Resp 18   LMP 11/17/2017   SpO2 100%   Physical Exam  Constitutional: She appears well-developed and well-nourished. No distress.  HENT:  Head: Normocephalic and atraumatic.  Mouth/Throat: Oropharynx is clear and moist.  Eyes: EOM are normal. Pupils are equal, round, and reactive to light. Right eye exhibits no discharge. Left eye exhibits no discharge.  Neck: Neck supple.  Cardiovascular: Normal rate, regular rhythm, normal heart sounds and intact distal pulses.  Pulmonary/Chest: Effort normal and breath sounds normal. No stridor. No respiratory distress. She has no wheezes. She has no rales.  Respirations equal and unlabored, patient able to speak in full sentences, lungs clear to  auscultation bilaterally, chest pain not reproducible on palpation  Abdominal: Soft. Bowel sounds are normal. She exhibits no distension and no mass. There is tenderness. There is no rebound and no guarding.  Abdomen soft, nondistended, minimal tenderness on palpation of the epigastric region, no guarding, all other quadrants nontender to palpation without guarding, rebound or peritoneal signs  Musculoskeletal: She exhibits no edema or deformity.  No lower extremity edema noted, bilateral lower extremities nontender to palpation  Neurological: She is alert. Coordination normal.  Speech is clear, able to follow commands CN III-XII intact Normal strength in upper and lower extremities bilaterally including dorsiflexion and plantar flexion, strong and equal grip strength Sensation normal to light and sharp touch Moves extremities without ataxia, coordination intact  Skin: Skin is warm and dry. Capillary refill takes less than 2 seconds. She is not diaphoretic.  Psychiatric: She has a normal mood and affect. Her behavior is normal.  Nursing note and vitals reviewed.    ED Treatments / Results  Labs (all labs ordered are listed, but only abnormal results are displayed) Labs Reviewed  BASIC METABOLIC PANEL  CBC  HEPARIN LEVEL (UNFRACTIONATED)  HIV ANTIBODY (ROUTINE TESTING)  TROPONIN I  TROPONIN I  I-STAT TROPONIN, ED  I-STAT TROPONIN, ED    EKG  EKG Interpretation  Date/Time:  Friday November 24 2017 11:10:05 EST Ventricular Rate:  57 PR Interval:  204 QRS Duration: 94 QT Interval:  404 QTC Calculation: 393 R Axis:   71 Text Interpretation:  Sinus bradycardia Nonspecific T wave abnormality Abnormal ECG Confirmed by Milton Ferguson [phone]) on 11/24/2017 3:22:47 PM       Radiology Dg Chest 2 View  Result Date: 11/24/2017 CLINICAL DATA:  Chest pain shortness of breath EXAM: CHEST  2 VIEW COMPARISON:  12/03/2015 FINDINGS: The heart size and mediastinal contours are within normal  limits. Both lungs are clear. The visualized skeletal structures are unremarkable. Remote cholecystectomy noted. Trachea is midline. IMPRESSION: No active cardiopulmonary disease. Electronically Signed   By: Jerilynn Mages.  Shick M.D.   On: 11/24/2017 11:30    Procedures Procedures (including critical care time)  Medications Ordered in ED Medications  heparin ADULT infusion 100 units/mL (25000 units/279mL sodium chloride 0.45%) (800 Units/hr Intravenous New Bag/Given 11/24/17 1506)  ondansetron (ZOFRAN-ODT) disintegrating tablet 4 mg (4 mg Oral Given 11/24/17 1334)  gi cocktail (Maalox,Lidocaine,Donnatal) (30 mLs Oral Given 11/24/17  1334)  aspirin chewable tablet 243 mg (243 mg Oral Given 11/24/17 1334)  nitroGLYCERIN 50 mg in dextrose 5 % 250 mL (0.2 mg/mL) infusion (10 mcg/min Intravenous Rate/Dose Change 11/24/17 1533)  heparin bolus via infusion 3,000 Units (3,000 Units Intravenous Bolus from Bag 11/24/17 1508)     Initial Impression / Assessment and Plan / ED Course  I have reviewed the triage vital signs and the nursing notes.  Pertinent labs & imaging results that were available during my care of the patient were reviewed by me and considered in my medical decision making (see chart for details).  Patient presents for evaluation of left-sided chest pain, initially started last night and relieved with nitroglycerin, but returned today and radiating to the left arm.  Pain has persisted and is still present here in the ED.  She reports some mild shortness of breath, epigastric pain and nausea..  Patient mildly hypertensive, vitals otherwise normal.  Initial EKG with non-specific ST-T waves changes and troponin unremarkable, general labs normal.  Will give Zofran, GI cocktail and aspirin, will reevaluate patient and see if epigastric pain can be eliminated and if chest pain still persists.  2:10 PM reports epigastric pain and nausea has resolved but patient continues to experience chest pain and reports feeling a  bit lightheaded as well, will give nitropaste.  Consult placed to cardiology.  2:31 PM talk with Dr. Einar Gip who recommends going ahead and starting the patient on nitro drip and heparin drip since she is still having active chest pain, and his partner will see the patient.  Chest pain not improved thus far with Nitropaste.  3:59 PM Dr. Virgina Jock evaluated the patient and plans to take the patient to the cath lab at 4:30 today, will admit to cardiology service. After nitro gtt started pt finally chest pain free.  Final Clinical Impressions(s) / ED Diagnoses   Final diagnoses:  Unstable angina ([place])  Coronary artery disease involving native heart with unstable angina pectoris, unspecified vessel or lesion type [facility])    ED Discharge Orders    None          Jacqlyn Larsen, [place] 11/24/17 1631    Milton Ferguson, MD 11/27/17 [phone]

## 2017-11-24 NOTE — Progress Notes (Signed)
ANTICOAGULATION CONSULT NOTE - Initial Consult  Pharmacy Consult:  Heparin Indication: chest pain/ACS  Allergies  Allergen Reactions  . Crestor [Rosuvastatin Calcium] Other (See Comments)    Muscle aches and pains  . Lipitor [Atorvastatin] Other (See Comments)    Muscle aches and pains    Patient Measurements: Height: 5\' 2"  (157.5 cm) Weight: 161 lb (73 kg) IBW/kg (Calculated) : 50.1 Heparin Dosing Weight: 66 kg  Vital Signs: Temp: 98 F (36.7 C) (03/01 1104) Temp Source: Oral (03/01 1104) BP: 129/68 (03/01 1325) Pulse Rate: 49 (03/01 1325)  Labs: Recent Labs    11/24/17 1101  HGB 12.2  HCT 38.0  PLT 300  CREATININE 0.78    Estimated Creatinine Clearance: 77.9 mL/min (by C-G formula based on SCr of 0.78 mg/dL).   Medical History: Past Medical History:  Diagnosis Date  . Acute pancreatitis 2011  . Allergy   . Anemia   . CAD (coronary artery disease) 2009   Dr Olegario Shearer -[facility], Heart Cath  . Family history of adverse reaction to anesthesia    "mother and brother have difficulty waking up out from it"  . GERD (gastroesophageal reflux disease)   . Hyperlipidemia   . Hypertension   . Hypothyroidism    "recently dx'd" (12/07/2015)  . PVC (premature ventricular contraction)   . Statin intolerance   . STD (sexually transmitted disease)    CHL 20 yrs ago  . Thyroid nodule 2010   "precancerous; thyroid treated with radiation"  . Tubular adenoma of colon 06/2015      Assessment: 52 YOM presented with chest pain and Pharmacy consulted to start IV heparin.  Baseline labs reviewed.     Goal of Therapy:  Heparin level 0.3-0.7 units/ml Monitor platelets by anticoagulation protocol: Yes    Plan:  Heparin 3000 units IV bolus x 1, then Heparin gtt at 800 units/hr Check 6 hr heparin level Daily heparin level and CBC   Ivon Roedel D. Mina Marble, PharmD, BCPS Pager:  [phone] 11/24/2017, 2:40 PM

## 2017-11-24 NOTE — Discharge Summary (Addendum)
Physician Discharge Summary  Patient ID: [name] MRN: [id number] DOB/AGE: [birth year] 52 y.o.  Admit date: 11/24/2017 Discharge date: 11/24/2017  Admission Diagnoses: Chest pain, suspected unstable angina  Discharge Diagnoses:  Chest pain, noncardiac  Discharged Condition: good  Hospital Course:   Patient underwent cath for suspected unstable angina. Mild nonobstructive CAD with no critical lesion to explain her symptoms. Mid LAD stent widely patent. LVEDP and LVEF normal. D-dimer mildly elevated, but unlikely due to PE. Pain likely musculoskeletal or related to GERD.  Consults: None  Significant Diagnostic Studies: Labs Results for [name] (MRN [id number]) as of 11/24/2017 17:18  Ref. Range 11/24/2017 11:01  WBC Latest Ref Range: 4.0 - 10.5 K/uL 6.1  RBC Latest Ref Range: 3.87 - 5.11 MIL/uL 4.15  Hemoglobin Latest Ref Range: 12.0 - 15.0 g/dL 12.2  HCT Latest Ref Range: 36.0 - 46.0 % 38.0  MCV Latest Ref Range: 78.0 - 100.0 fL 91.6  MCH Latest Ref Range: 26.0 - 34.0 pg 29.4  MCHC Latest Ref Range: 30.0 - 36.0 g/dL 32.1  RDW Latest Ref Range: 11.5 - 15.5 % 14.7  Platelets Latest Ref Range: 150 - 400 K/uL 300   Results for [name] (MRN [id number]) as of 11/24/2017 17:18  Ref. Range 11/24/2017 11:01 11/24/2017 11:19 11/24/2017 61:60  BASIC METABOLIC PANEL Unknown Rpt    Sodium Latest Ref Range: 135 - 145 mmol/L 139    Potassium Latest Ref Range: 3.5 - 5.1 mmol/L 4.2    Chloride Latest Ref Range: 101 - 111 mmol/L 105    CO2 Latest Ref Range: 22 - 32 mmol/L 23    Glucose Latest Ref Range: 65 - 99 mg/dL 74    BUN Latest Ref Range: 6 - 20 mg/dL 8    Creatinine Latest Ref Range: 0.44 - 1.00 mg/dL 0.78    Calcium Latest Ref Range: 8.9 - 10.3 mg/dL 9.4    Anion gap Latest Ref Range: 5 - 15  11    GFR, Est Non African American Latest Ref Range: >60 mL/min >60    GFR, Est African American Latest Ref Range: >60 mL/min >60    Troponin i, poc Latest Ref Range: 0.00 - 0.08  ng/mL  0.00 0.00   Treatments: None  Discharge Exam: Blood pressure 135/89, pulse (!) 48, temperature 98 F (36.7 C), temperature source Oral, resp. rate 20, height 5\' 2"  (1.575 m), weight 73 kg (161 lb), last menstrual period 11/17/2017, SpO2 100 %. Nursing note and vitals reviewed. Constitutional: She is oriented to person, place, and time. She appears well-developed. No distress.  HENT:  Head: Normocephalic and atraumatic.  Eyes: Conjunctivae are normal. Pupils are equal, round, and reactive to light.  Neck: Normal range of motion. Neck supple. No JVD present.  Cardiovascular: Normal rate, regular rhythm and normal heart sounds.  No murmur heard. Normal distal pulses. No carotid bruit  Right radial site with no bleeding, hematoma. Good capillary refill.  Respiratory: Effort normal and breath sounds normal.  GI: Soft. Bowel sounds are normal. There is no tenderness.  Musculoskeletal: She exhibits no edema.  Lymphadenopathy:    She has no cervical adenopathy  Neurological: She is alert and oriented to person, place, and time. No cranial nerve deficit.  Skin: Skin is warm and dry.  Psychiatric: She has a normal mood and affect.     Disposition: 01-Home or Self Care   Allergies as of 11/24/2017      Reactions   Crestor [rosuvastatin Calcium] Other (See  Comments)   Muscle aches and pains   Lipitor [atorvastatin] Other (See Comments)   Muscle aches and pains      Medication List    STOP taking these medications   prasugrel 10 MG Tabs tablet Commonly known as:  EFFIENT     TAKE these medications   amLODipine 5 MG tablet Commonly known as:  NORVASC Take 1 tablet (5 mg total) by mouth daily.   aspirin EC 81 MG tablet Take 81 mg by mouth daily.   atenolol 25 MG tablet Commonly known as:  TENORMIN TAKE 1 TABLET (25 MG TOTAL) BY MOUTH DAILY.   lisinopril 40 MG tablet Commonly known as:  PRINIVIL,ZESTRIL Take 1 tablet (40 mg total) by mouth daily.   nitroGLYCERIN  0.4 MG SL tablet Commonly known as:  NITROSTAT Place 1 tablet (0.4 mg total) under the tongue every 5 (five) minutes as needed for chest pain.   Pitavastatin Calcium 2 MG Tabs Commonly known as:  LIVALO Take 1 tablet (2 mg total) by mouth daily.   SYSTANE OP Place 1 drop into both eyes daily as needed (dry eyes).   Vitamin D 2000 units tablet Take 2,000 Units by mouth daily.        SignedNigel Mormon 11/24/2017, 5:17 PM   [place], MD [facility] Pager: [phone] Office: [phone] If no answer Cell [phone]

## 2017-11-24 NOTE — H&P (Addendum)
[name] is an 52 y.o. female.   Chief Complaint: Chest pain  HPI:   52 y/o AAF w/CAD s/p LAD PCI 11/2015 3.0 X 15 mm DES, hypertension, hyperlipidemia, here with waxing and waning chest pain and shortness of breath, responding to NTG> EKG shows nonspsecific ST-T changes inferior leads. Stat trop negative.   Past Medical History:  Diagnosis Date  . Acute pancreatitis 2011  . Allergy   . Anemia   . CAD (coronary artery disease) 2009   Dr Olegario Shearer -[facility], Heart Cath  . Family history of adverse reaction to anesthesia    "mother and brother have difficulty waking up out from it"  . GERD (gastroesophageal reflux disease)   . Hyperlipidemia   . Hypertension   . Hypothyroidism    "recently dx'd" (12/07/2015)  . PVC (premature ventricular contraction)   . Statin intolerance   . STD (sexually transmitted disease)    CHL 20 yrs ago  . Thyroid nodule 2010   "precancerous; thyroid treated with radiation"  . Tubular adenoma of colon 06/2015    Past Surgical History:  Procedure Laterality Date  . CARDIAC CATHETERIZATION     unable to do cardiac cath but does stress echo 11/22/11  . CARDIAC CATHETERIZATION N/A 12/04/2015   Procedure: Left Heart Cath and Coronary Angiography;  Surgeon: Adrian Prows, MD;  Location: [facility];  Service: Cardiovascular;  Laterality: N/A;  . CARDIAC CATHETERIZATION N/A 12/04/2015   Procedure: Intravascular Pressure Wire/FFR Study;  Surgeon: Adrian Prows, MD;  Location: [facility];  Service: Cardiovascular;  Laterality: N/A;  . CARDIAC CATHETERIZATION N/A 12/07/2015   Procedure: Coronary Stent Intervention;  Surgeon: Adrian Prows, MD;  Location: [facility];  Service: Cardiovascular;  Laterality: N/A;  . EYE MUSCLE SURGERY Left 1969  . LAPAROSCOPIC CHOLECYSTECTOMY  2008  . TUBAL LIGATION Bilateral 1995  . UPPER GASTROINTESTINAL ENDOSCOPY    . VENTRICULAR ABLATION SURGERY  2006,2010   times 2, follow-up for v-tack    Family History   Problem Relation Age of Onset  . Hypertension Father   . Prostate cancer Father   . Heart attack Father   . Colon cancer Father 63  . Hypertension Mother   . Diabetes Mother   . Crohn's disease Daughter   . Heart attack Cousin   . Diabetes Maternal Grandmother   . Cancer Sister        ovarian  . Diabetes Brother   . Esophageal cancer Neg Hx   . Rectal cancer Neg Hx   . Stomach cancer Neg Hx    Social History:  reports that  has never smoked. she has never used smokeless tobacco. She reports that she does not drink alcohol or use drugs.  Allergies:  Allergies  Allergen Reactions  . Crestor [Rosuvastatin Calcium] Other (See Comments)    Muscle aches and pains  . Lipitor [Atorvastatin] Other (See Comments)    Muscle aches and pains     (Not in a hospital admission)  Results for orders placed or performed during the hospital encounter of 11/24/17 (from the past 48 hour(s))  Basic metabolic panel     Status: None   Collection Time: 11/24/17 11:01 AM  Result Value Ref Range   Sodium 139 135 - 145 mmol/L   Potassium 4.2 3.5 - 5.1 mmol/L   Chloride 105 101 - 111 mmol/L   CO2 23 22 - 32 mmol/L   Glucose, Bld 74 65 - 99 mg/dL   BUN 8  6 - 20 mg/dL   Creatinine, Ser 0.78 0.44 - 1.00 mg/dL   Calcium 9.4 8.9 - 10.3 mg/dL   GFR calc non Af Amer >60 >60 mL/min   GFR calc Af Amer >60 >60 mL/min    Comment: (NOTE) The eGFR has been calculated using the CKD EPI equation. This calculation has not been validated in all clinical situations. eGFR's persistently <60 mL/min signify possible Chronic Kidney Disease.    Anion gap 11 5 - 15    Comment: Performed at [facility] [address] [place], [place] [address]  CBC     Status: None   Collection Time: 11/24/17 11:01 AM  Result Value Ref Range   WBC 6.1 4.0 - 10.5 K/uL   RBC 4.15 3.87 - 5.11 MIL/uL   Hemoglobin 12.2 12.0 - 15.0 g/dL   HCT 38.0 36.0 - 46.0 %   MCV 91.6 78.0 - 100.0 fL   MCH 29.4 26.0 - 34.0 pg   MCHC  32.1 30.0 - 36.0 g/dL   RDW 14.7 11.5 - 15.5 %   Platelets 300 150 - 400 K/uL    Comment: Performed at [facility], [place] [address], [place], [place] [address]  I-stat troponin, ED     Status: None   Collection Time: 11/24/17 11:19 AM  Result Value Ref Range   Troponin i, poc 0.00 0.00 - 0.08 ng/mL   Comment 3            Comment: Due to the release kinetics of cTnI, a negative result within the first hours of the onset of symptoms does not rule out myocardial infarction with certainty. If myocardial infarction is still suspected, repeat the test at appropriate intervals.   I-stat troponin, ED     Status: None   Collection Time: 11/24/17  3:17 PM  Result Value Ref Range   Troponin i, poc 0.00 0.00 - 0.08 ng/mL   Comment 3            Comment: Due to the release kinetics of cTnI, a negative result within the first hours of the onset of symptoms does not rule out myocardial infarction with certainty. If myocardial infarction is still suspected, repeat the test at appropriate intervals.    Dg Chest 2 View  Result Date: 11/24/2017 CLINICAL DATA:  Chest pain shortness of breath EXAM: CHEST  2 VIEW COMPARISON:  12/03/2015 FINDINGS: The heart size and mediastinal contours are within normal limits. Both lungs are clear. The visualized skeletal structures are unremarkable. Remote cholecystectomy noted. Trachea is midline. IMPRESSION: No active cardiopulmonary disease. Electronically Signed   By: Jerilynn Mages.  Shick M.D.   On: 11/24/2017 11:30    Review of Systems  Constitutional: Negative for chills and fever.  HENT: Negative.   Eyes: Negative.   Respiratory: Positive for shortness of breath. Negative for cough.   Cardiovascular: Positive for chest pain. Negative for claudication, leg swelling and PND.  Gastrointestinal: Negative for heartburn.  Genitourinary: Negative.   Musculoskeletal: Negative.   Skin: Negative.   Neurological: Negative for dizziness and loss of consciousness.   Endo/Heme/Allergies: Does not bruise/bleed easily.  Psychiatric/Behavioral: The patient is nervous/anxious.   All other systems reviewed and are negative.   Blood pressure (!) 141/91, pulse (!) 48, temperature 98 F (36.7 C), temperature source Oral, resp. rate 11, height 5' 2"  (1.575 m), weight 73 kg (161 lb), last menstrual period 11/17/2017, SpO2 100 %. Physical Exam  Nursing note and vitals reviewed. Constitutional: She is  oriented to person, place, and time. She appears well-developed. No distress.  HENT:  Head: Normocephalic and atraumatic.  Eyes: Conjunctivae are normal. Pupils are equal, round, and reactive to light.  Neck: Normal range of motion. Neck supple. No JVD present.  Cardiovascular: Normal rate, regular rhythm and normal heart sounds.  No murmur heard. Normal distal pulses. No carotid bruit  Respiratory: Effort normal and breath sounds normal.  GI: Soft. Bowel sounds are normal. There is no tenderness.  Musculoskeletal: She exhibits no edema.  Lymphadenopathy:    She has cervical adenopathy.  Neurological: She is alert and oriented to person, place, and time. No cranial nerve deficit.  Skin: Skin is warm and dry.  Psychiatric: She has a normal mood and affect.     Assessment: Unstable angina CAD s/p LAD PCI 11/2015  Plan: Plan to cath today.  Nigel Mormon, MD 11/24/2017, 3:59 PM  Manish Esther Hardy, MD [facility] [place] Pager: [phone] Office: [phone] If no answer Cell [phone]

## 2017-11-24 NOTE — Progress Notes (Signed)
Please note correction to H&P: No cervical adenopathy  Elizabeth Mormon, MD [facility] [place] Pager: [phone] Office: [phone] If no answer Cell [phone]

## 2017-11-24 NOTE — Progress Notes (Signed)
TR BAND REMOVAL  LOCATION:    right radial  DEFLATED PER PROTOCOL:    Yes.    TIME BAND OFF / DRESSING APPLIED:   2100  SITE UPON ARRIVAL:    Level 0  SITE AFTER BAND REMOVAL:    Level 0  CIRCULATION SENSATION AND MOVEMENT:    Within Normal Limits   Yes.    COMMENTS:   Pt.tolerated well ;Post TRB instructions given

## 2017-11-24 NOTE — Interval H&P Note (Signed)
History and Physical Interval Note:  11/24/2017 4:33 PM  [name]  has presented today for surgery, with the diagnosis of cp  The various methods of treatment have been discussed with the patient and family. After consideration of risks, benefits and other options for treatment, the patient has consented to  Procedure(s): LEFT HEART CATH AND CORONARY ANGIOGRAPHY (N/A) as a surgical intervention .  The patient's history has been reviewed, patient examined, no change in status, stable for surgery.  I have reviewed the patient's chart and labs.  Questions were answered to the patient's satisfaction.    2016 Appropriate Use Criteria for Coronary Revascularization in Patients With Acute Coronary Syndrome NSTEMI/UA Intermediate Risk (TIMI Score 3-4)  NSTEMI/Unstable angina, stabilized patient at Intermediate Risk (TIMI Score 3-4) Link Here: http://dodson-rose.net/ Indication:  Revascularization by PCI or CABG of 1 or more arteries in a patient with NSTEMI or unstable angina with Stabilization after presentation Intermediate risk for clinical events  A (7) Indication: 16; Score 7      [place]

## 2017-11-25 LAB — HIV ANTIBODY (ROUTINE TESTING W REFLEX): HIV Screen 4th Generation wRfx: NONREACTIVE

## 2017-11-27 ENCOUNTER — Encounter (HOSPITAL_COMMUNITY): Payer: Self-pay | Admitting: Cardiology

## 2017-12-01 ENCOUNTER — Other Ambulatory Visit: Payer: Self-pay

## 2017-12-01 ENCOUNTER — Emergency Department (HOSPITAL_COMMUNITY)
Admission: EM | Admit: 2017-12-01 | Discharge: 2017-12-01 | Disposition: A | Discharge status: Home / Self Care | Payer: PRIVATE HEALTH INSURANCE | Attending: Emergency Medicine | Admitting: Emergency Medicine

## 2017-12-01 ENCOUNTER — Encounter (HOSPITAL_COMMUNITY): Payer: Self-pay | Admitting: *Deleted

## 2017-12-01 ENCOUNTER — Emergency Department (HOSPITAL_COMMUNITY): Payer: PRIVATE HEALTH INSURANCE

## 2017-12-01 DIAGNOSIS — R112 Nausea with vomiting, unspecified: Secondary | ICD-10-CM | POA: Diagnosis not present

## 2017-12-01 DIAGNOSIS — E039 Hypothyroidism, unspecified: Secondary | ICD-10-CM | POA: Insufficient documentation

## 2017-12-01 DIAGNOSIS — Z7982 Long term (current) use of aspirin: Secondary | ICD-10-CM | POA: Insufficient documentation

## 2017-12-01 DIAGNOSIS — R300 Dysuria: Secondary | ICD-10-CM | POA: Insufficient documentation

## 2017-12-01 DIAGNOSIS — I10 Essential (primary) hypertension: Secondary | ICD-10-CM | POA: Diagnosis not present

## 2017-12-01 DIAGNOSIS — Z79899 Other long term (current) drug therapy: Secondary | ICD-10-CM | POA: Diagnosis not present

## 2017-12-01 DIAGNOSIS — Z9049 Acquired absence of other specified parts of digestive tract: Secondary | ICD-10-CM | POA: Insufficient documentation

## 2017-12-01 DIAGNOSIS — I2510 Atherosclerotic heart disease of native coronary artery without angina pectoris: Secondary | ICD-10-CM | POA: Diagnosis not present

## 2017-12-01 DIAGNOSIS — R079 Chest pain, unspecified: Secondary | ICD-10-CM | POA: Insufficient documentation

## 2017-12-01 DIAGNOSIS — M545 Low back pain: Secondary | ICD-10-CM | POA: Diagnosis present

## 2017-12-01 LAB — I-STAT BETA HCG BLOOD, ED (MC, WL, AP ONLY): I-stat hCG, quantitative: <5 m[IU]/mL (ref <5)

## 2017-12-01 LAB — BRAIN NATRIURETIC PEPTIDE: B Natriuretic Peptide: 29.6 pg/mL (ref 0.0–100.0)

## 2017-12-01 LAB — URINALYSIS, ROUTINE W REFLEX MICROSCOPIC
Bilirubin Urine: NEGATIVE
GLUCOSE, UA: NEGATIVE mg/dL
HGB URINE DIPSTICK: NEGATIVE
Ketones, ur: NEGATIVE mg/dL
Leukocytes, UA: NEGATIVE
Nitrite: NEGATIVE
PH: 5 (ref 5.0–8.0)
Protein, ur: NEGATIVE mg/dL
SPECIFIC GRAVITY, URINE: 1.018 (ref 1.005–1.030)

## 2017-12-01 LAB — HEPATIC FUNCTION PANEL
ALT: 23 U/L (ref 14–54)
AST: 20 U/L (ref 15–41)
Albumin: 3.8 g/dL (ref 3.5–5.0)
Alkaline Phosphatase: 45 U/L (ref 38–126)
BILIRUBIN DIRECT: 0.1 mg/dL (ref 0.1–0.5)
Indirect Bilirubin: 0.9 mg/dL (ref 0.3–0.9)
Total Bilirubin: 1 mg/dL (ref 0.3–1.2)
Total Protein: 7.5 g/dL (ref 6.5–8.1)

## 2017-12-01 LAB — BASIC METABOLIC PANEL
Anion gap: 10 (ref 5–15)
BUN: 8 mg/dL (ref 6–20)
CO2: 24 mmol/L (ref 22–32)
Calcium: 9.5 mg/dL (ref 8.9–10.3)
Chloride: 106 mmol/L (ref 101–111)
Creatinine, Ser: 0.78 mg/dL (ref 0.44–1.00)
GFR calc Af Amer: >60 mL/min (ref >60)
Glucose, Bld: 92 mg/dL (ref 65–99)
POTASSIUM: 4.8 mmol/L (ref 3.5–5.1)
Sodium: 140 mmol/L (ref 135–145)

## 2017-12-01 LAB — CBC
HEMATOCRIT: 37.9 % (ref 36.0–46.0)
Hemoglobin: 12.5 g/dL (ref 12.0–15.0)
MCH: 29.6 pg (ref 26.0–34.0)
MCHC: 33 g/dL (ref 30.0–36.0)
MCV: 89.6 fL (ref 78.0–100.0)
Platelets: 272 10*3/uL (ref 150–400)
RBC: 4.23 MIL/uL (ref 3.87–5.11)
RDW: 14.2 % (ref 11.5–15.5)
WBC: 7 10*3/uL (ref 4.0–10.5)

## 2017-12-01 LAB — I-STAT CHEM 8, ED
BUN: 9 mg/dL (ref 6–20)
Calcium, Ion: 1.19 mmol/L (ref 1.15–1.40)
Chloride: 102 mmol/L (ref 101–111)
Creatinine, Ser: 0.7 mg/dL (ref 0.44–1.00)
Glucose, Bld: 84 mg/dL (ref 65–99)
HEMATOCRIT: 39 % (ref 36.0–46.0)
HEMOGLOBIN: 13.3 g/dL (ref 12.0–15.0)
Potassium: 3.9 mmol/L (ref 3.5–5.1)
SODIUM: 139 mmol/L (ref 135–145)
TCO2: 26 mmol/L (ref 22–32)

## 2017-12-01 LAB — I-STAT TROPONIN, ED
TROPONIN I, POC: 0 ng/mL (ref 0.00–0.08)
Troponin i, poc: 0 ng/mL (ref 0.00–0.08)

## 2017-12-01 LAB — WET PREP, GENITAL
Clue Cells Wet Prep HPF POC: NONE SEEN
Sperm: NONE SEEN
TRICH WET PREP: NONE SEEN
YEAST WET PREP: NONE SEEN

## 2017-12-01 LAB — LIPASE, BLOOD: Lipase: 28 U/L (ref 11–51)

## 2017-12-01 MED ORDER — ASPIRIN 81 MG PO CHEW
162.0000 mg | CHEWABLE_TABLET | Freq: Once | ORAL | Status: AC
  Administered 2017-12-01: 162 mg via ORAL
  Filled 2017-12-01: qty 2

## 2017-12-01 MED ORDER — SODIUM CHLORIDE 0.9 % IV BOLUS (SEPSIS)
1000.0000 mL | Freq: Once | INTRAVENOUS | Status: AC
  Administered 2017-12-01: 1000 mL via INTRAVENOUS

## 2017-12-01 MED ORDER — SODIUM CHLORIDE 0.9 % IV SOLN: INTRAVENOUS | Status: DC

## 2017-12-01 MED ORDER — NITROGLYCERIN 0.4 MG SL SUBL
0.4000 mg | SUBLINGUAL_TABLET | SUBLINGUAL | Status: DC | PRN
  Administered 2017-12-01: 0.4 mg via SUBLINGUAL
  Filled 2017-12-01: qty 1

## 2017-12-01 NOTE — ED Provider Notes (Signed)
[facility] EMERGENCY DEPARTMENT Provider Note   CSN: [id number] Arrival date & time: 12/01/17  1318     History   Chief Complaint Chief Complaint  Patient presents with  . Chest Pain    HPI [name] is a 52 y.o. female.  HPI  52 year old female history of coronary artery disease/PCI 2016 daily aspirin, prior sexually transmitted infection, hypertension, hyperlipidemia compliant with medications, chronic chest pain had  heart cath times 1 week ago With no obstructive lesions (was admitted for unstable angina),  has been followed by Dr. Duke Salvia cardiologist and recently changed to a new cardiologist with the [facility] system and is pending evaluation this coming Monday with an echocardiogram.  Patient states having some chest pressure since this morning that radiates to the left without shortness of breath, recent fever/illness or trauma.  Patient denies any syncope.    Patient had taken her 81 mg aspirin this morning.  Patient has a separate complaint of 4 days tingling when she urinates with a vaginal discharge with bilateral lower back pain.  Patient denies any history of renal stones.  Past Medical History:  Diagnosis Date  . Acute pancreatitis 2011  . Allergy   . Anemia   . CAD (coronary artery disease) 2009   Dr Olegario Shearer -[facility], Heart Cath  . Family history of adverse reaction to anesthesia    "mother and brother have difficulty waking up out from it"  . GERD (gastroesophageal reflux disease)   . Hyperlipidemia   . Hypertension   . Hypothyroidism    "recently dx'd" (12/07/2015)  . PVC (premature ventricular contraction)   . Statin intolerance   . STD (sexually transmitted disease)    CHL 20 yrs ago  . Thyroid nodule 2010   "precancerous; thyroid treated with radiation"  . Tubular adenoma of colon 06/2015    Patient Active Problem List   Diagnosis Date Noted  . Palpitations 12/28/2015  . CAD (coronary artery disease), native coronary  artery 12/06/2015  . Non-cardiac chest pain 12/04/2015  . Epigastric abdominal pain 12/04/2015  . Bladder pain 05/14/2015  . Incomplete bladder emptying 05/14/2015  . Occipital neuralgia of left side 11/14/2013  . Aspiration into respiratory tract 04/03/2012  . Cough due to bronchospasm 04/03/2012  . Chest pain, atypical 04/03/2012  . HYPERGLYCEMIA 06/16/2010  . BACK PAIN, LUMBAR 05/27/2009  . THYROID NODULE, RIGHT 05/04/2009  . HYPERTHYROIDISM 05/04/2009  . Hyperlipidemia with target LDL less than 70 12/31/2008  . H/O acute pancreatitis 12/31/2008  . LEG PAIN, BILATERAL 12/31/2008  . PARESTHESIA 12/31/2008  . EDEMA 12/31/2008  . RLQ abdominal pain 10/02/2007  . GERD 09/17/2007  . DYSPNEA 08/28/2007  . Dysphagia 08/28/2007  . Essential hypertension 04/24/2007    Past Surgical History:  Procedure Laterality Date  . CARDIAC CATHETERIZATION     unable to do cardiac cath but does stress echo 11/22/11  . CARDIAC CATHETERIZATION N/A 12/04/2015   Procedure: Left Heart Cath and Coronary Angiography;  Surgeon: Adrian Prows, MD;  Location: [facility];  Service: Cardiovascular;  Laterality: N/A;  . CARDIAC CATHETERIZATION N/A 12/04/2015   Procedure: Intravascular Pressure Wire/FFR Study;  Surgeon: Adrian Prows, MD;  Location: [facility];  Service: Cardiovascular;  Laterality: N/A;  . CARDIAC CATHETERIZATION N/A 12/07/2015   Procedure: Coronary Stent Intervention;  Surgeon: Adrian Prows, MD;  Location: [facility];  Service: Cardiovascular;  Laterality: N/A;  . EYE MUSCLE SURGERY Left 1969  . LAPAROSCOPIC CHOLECYSTECTOMY  2008  .  LEFT HEART CATH AND CORONARY ANGIOGRAPHY N/A 11/24/2017   Procedure: LEFT HEART CATH AND CORONARY ANGIOGRAPHY;  Surgeon: Nigel Mormon, MD;  Location: [facility];  Service: Cardiovascular;  Laterality: N/A;  . TUBAL LIGATION Bilateral 1995  . UPPER GASTROINTESTINAL ENDOSCOPY    . VENTRICULAR ABLATION SURGERY  2006,2010   times 2,  follow-up for v-tack    OB History    Gravida Para Term Preterm AB Living   4 4 4     4    SAB TAB Ectopic Multiple Live Births           4       Home Medications    Prior to Admission medications   Medication Sig Start Date End Date Taking? Authorizing Provider  acetaminophen (TYLENOL) 500 MG tablet Take 1,000 mg by mouth every 6 (six) hours as needed for headache (pain).   Yes [provider]  amLODipine (NORVASC) 5 MG tablet Take 1 tablet (5 mg total) by mouth daily. 04/10/17  Yes Plotnikov, Evie Lacks, MD  aspirin EC 81 MG tablet Take 81 mg by mouth daily.   Yes [provider]  atenolol (TENORMIN) 25 MG tablet TAKE 1 TABLET (25 MG TOTAL) BY MOUTH DAILY. 08/20/17  Yes Plotnikov, Evie Lacks, MD  Cholecalciferol (VITAMIN D) 2000 units tablet Take 2,000 Units by mouth daily.   Yes [provider]  lisinopril (PRINIVIL,ZESTRIL) 40 MG tablet Take 1 tablet (40 mg total) by mouth daily. 04/05/17  Yes Plotnikov, Evie Lacks, MD  nitroGLYCERIN (NITROSTAT) 0.4 MG SL tablet Place 1 tablet (0.4 mg total) under the tongue every 5 (five) minutes as needed for chest pain. 12/08/15  Yes Neldon Labella, NP  Pitavastatin Calcium (LIVALO) 2 MG TABS Take 1 tablet (2 mg total) by mouth daily. 04/10/17  Yes Plotnikov, Evie Lacks, MD  Polyethyl Glycol-Propyl Glycol (SYSTANE OP) Place 1 drop into both eyes 5 (five) times daily as needed (dry eyes).    Yes [provider]    Family History Family History  Problem Relation Age of Onset  . Hypertension Father   . Prostate cancer Father   . Heart attack Father   . Colon cancer Father 17  . Hypertension Mother   . Diabetes Mother   . Crohn's disease Daughter   . Heart attack Cousin   . Diabetes Maternal Grandmother   . Cancer Sister        ovarian  . Diabetes Brother   . Esophageal cancer Neg Hx   . Rectal cancer Neg Hx   . Stomach cancer Neg Hx     Social History Social History   Tobacco Use  . Smoking status:  Never Smoker  . Smokeless tobacco: Never Used  Substance Use Topics  . Alcohol use: No  . Drug use: No     Allergies   Crestor [rosuvastatin calcium] and Lipitor [atorvastatin]   Review of Systems Review of Systems  Review of Systems  Constitutional: Negative for fever and chills.  HENT: Negative for ear pain, sore throat and trouble swallowing.   Eyes: Negative for pain and visual disturbance.  Respiratory: Negative for cough and shortness of breath.   Cardiovascular: see HPI Gastrointestinal: Negative for nausea, vomiting, abdominal pain and diarrhea.  Genitourinary: see HPI Musculoskeletal: Negative for back pain and joint swelling.  Skin: Negative for rash and wound.  Neurological: Negative for dizziness, syncope, speech difficulty, weakness and numbness.   Physical Exam Updated Vital Signs BP 121/87   Pulse Marland Kitchen)  49   Temp 97.6 F (36.4 C) (Oral)   Resp 16   LMP 11/17/2017   SpO2 95%   Physical Exam  Physical Exam Vitals:   12/01/17 1730 12/01/17 1900  BP: 113/78 121/87  Pulse: (!) 48 (!) 49  Resp: 12 16  Temp:    SpO2: 100% 95%   Constitutional: Patient is in no acute distress Head: Normocephalic and atraumatic.  Eyes: Extraocular motion intact, no scleral icterus Neck: Supple without meningismus, mass, or overt JVD Respiratory: Effort normal and breath sounds normal. No respiratory distress. CV: Heart regular rate and rhythm, no obvious murmurs.  Pulses +2 and symmetric Abdomen: Soft, non-tender, non-distended GU: neg CVA TTP; vaginal exam with no external lesions, negative vaginal discharge/negative blood in the canal, cervix is closed, negative lesions to the cervix, negative cervical motion tenderness, negative adnexal tenderness. MSK: Extremities are atraumatic without deformity, ROM intact Skin: Warm, dry, intact Neuro: Alert and oriented, no motor deficit noted Psychiatric: Mood and affect are normal.  ED Treatments / Results  Labs (all labs  ordered are listed, but only abnormal results are displayed) Labs Reviewed  WET PREP, GENITAL - Abnormal; Notable for the following components:      Result Value   WBC, Wet Prep HPF POC FEW (*)    All other components within normal limits  URINALYSIS, ROUTINE W REFLEX MICROSCOPIC - Abnormal; Notable for the following components:   APPearance HAZY (*)    All other components within normal limits  BASIC METABOLIC PANEL  CBC  BRAIN NATRIURETIC PEPTIDE  HEPATIC FUNCTION PANEL  LIPASE, BLOOD  I-STAT TROPONIN, ED  I-STAT BETA HCG BLOOD, ED ([facility], [facility], [facility] ONLY)  CBG MONITORING, ED  I-STAT TROPONIN, ED  I-STAT CHEM 8, ED  I-STAT BETA HCG BLOOD, ED ([facility], [facility], [facility] ONLY)  I-STAT TROPONIN, ED  GC/CHLAMYDIA PROBE AMP ([place]) NOT AT [facility]    EKG  EKG Interpretation  Date/Time:  Friday December 01 2017 13:29:44 EST Ventricular Rate:  60 PR Interval:  208 QRS Duration: 92 QT Interval:  390 QTC Calculation: 390 R Axis:   67 Text Interpretation:  Sinus rhythm with Premature supraventricular complexes Nonspecific T wave abnormality Abnormal ECG No significant change since last tracing , PVCs are new Confirmed by Gareth Morgan [phone]) on 12/01/2017 4:10:21 PM       Radiology Dg Chest 2 View  Result Date: 12/01/2017 CLINICAL DATA:  Chest pain off and on for a week, BILATERAL lower back pain, hypertension, coronary artery disease post stenting EXAM: CHEST - 2 VIEW COMPARISON:  11/24/2017 FINDINGS: Upper normal heart size with note of coronary stent. Mediastinal contours and pulmonary vascularity normal. Lungs clear. No pleural effusion or pneumothorax. Bones unremarkable. IMPRESSION: No acute abnormalities. Electronically Signed   By: Lavonia Dana M.D.   On: 12/01/2017 14:05    Procedures Procedures (including critical care time)  Medications Ordered in ED Medications  nitroGLYCERIN (NITROSTAT) SL tablet 0.4 mg (0.4 mg Sublingual Given 12/01/17 1744)  sodium chloride 0.9 % bolus 1,000 mL (0 mLs  Intravenous Stopped 12/01/17 1913)    And  0.9 %  sodium chloride infusion (not administered)  aspirin chewable tablet 162 mg (162 mg Oral Given 12/01/17 1653)     Initial Impression / Assessment and Plan / ED Course  I have reviewed the triage vital signs and the nursing notes.  Pertinent labs & imaging results that were available during my care of the patient were reviewed by me and considered in my medical  decision making (see chart for details).     52 year old female history of coronary artery disease/PCI 2016 daily aspirin, prior sexually transmitted infection, hypertension, hyperlipidemia compliant with medications, chronic chest pain had  heart cath times 1 week ago With no obstructive lesions (was admitted for unstable angina),  has been followed by Dr. Duke Salvia cardiologist and recently changed to a new cardiologist with the [facility] system and is pending evaluation this coming Monday with an echocardiogram.  Patient states having some chest pressure since this morning that radiates to the left without shortness of breath, recent fever/illness or trauma.  Patient denies any syncope.    Patient had taken her 81 mg aspirin this morning.  Patient has a separate complaint of 4 days tingling when she urinates with a vaginal discharge with bilateral lower back pain.  Patient denies any history of renal stones.  Review of the patient's labs shows no evidence of leukocytosis, stable hemoglobin at 12.5, negative electrolyte imbalance, wet prep no evidence concerning for vaginitis, urinalysis not concerning for urinary tract infection, doubt pyelonephritis, low suspicion for STI but vaginal swab GC/chlamydia pending.  Troponin x2- and no findings on EKG concerning for ACS.  It is reassuring patient had a left heart cath times 1 week ago and has close follow-up this coming Monday with cardiologist at [facility] pending an echo.  Review of x-ray shows no findings concerning for acute pathology.    Final  Clinical Impressions(s) / ED Diagnoses   Final diagnoses:  Chest pain, unspecified type  Dysuria    ED Discharge Orders    None      Willette Alma, DO 12/01/17 2043  Gareth Morgan, MD 12/02/17 1304

## 2017-12-01 NOTE — ED Triage Notes (Signed)
Pt in  C/o L sided Cp onset today, pt c/o bil lower back pain, denies SOB, v/d, c/o  Nausea, denies vomiting, A&O x4

## 2017-12-01 NOTE — Discharge Instructions (Signed)
Follow-up with your already scheduled appointment with cardiology next week.  Return to the emergency department for worsening of symptoms or as needed.

## 2017-12-01 NOTE — ED Notes (Signed)
Per edp don't need 3rd troponin

## 2017-12-04 ENCOUNTER — Telehealth: Payer: Self-pay | Admitting: *Deleted

## 2017-12-04 LAB — GC/CHLAMYDIA PROBE AMP (~~LOC~~) NOT AT ARMC
Chlamydia: NEGATIVE
Neisseria Gonorrhea: NEGATIVE

## 2017-12-04 NOTE — Telephone Encounter (Signed)
Copied from [place]. Topic: Appointment Scheduling - Scheduling Inquiry for Clinic >> Dec 04, 2017  8:55 AM [name] B wrote: Reason for CRM: pt requesting lab for her kidneys and pancreas before her 12/15/17 appt with Dr. Alain Marion.   Rec'd CRM this am w/pt requesting labs prior to appt. MD is out of the office will hold until he return to ok order.Marland KitchenJohny Chess

## 2017-12-10 NOTE — Telephone Encounter (Signed)
Looked at the labs from March 8.  They were normal.  It is best to come for an office visit without lab work.  Will get better over deciding what to do after we discussed her issues. Thank you

## 2017-12-11 NOTE — Telephone Encounter (Signed)
Notified pt w/MD response../lm,b 

## 2017-12-13 ENCOUNTER — Other Ambulatory Visit (INDEPENDENT_AMBULATORY_CARE_PROVIDER_SITE_OTHER): Payer: PRIVATE HEALTH INSURANCE

## 2017-12-13 ENCOUNTER — Encounter: Payer: Self-pay | Admitting: Internal Medicine

## 2017-12-13 ENCOUNTER — Ambulatory Visit: Payer: PRIVATE HEALTH INSURANCE | Admitting: Internal Medicine

## 2017-12-13 VITALS — BP 124/76 | HR 63 | Temp 98.4°F | Ht 62.0 in | Wt 176.0 lb

## 2017-12-13 DIAGNOSIS — K219 Gastro-esophageal reflux disease without esophagitis: Secondary | ICD-10-CM | POA: Diagnosis not present

## 2017-12-13 DIAGNOSIS — E0580 Other thyrotoxicosis without thyrotoxic crisis or storm: Secondary | ICD-10-CM | POA: Diagnosis not present

## 2017-12-13 DIAGNOSIS — E034 Atrophy of thyroid (acquired): Secondary | ICD-10-CM

## 2017-12-13 DIAGNOSIS — I2510 Atherosclerotic heart disease of native coronary artery without angina pectoris: Secondary | ICD-10-CM | POA: Diagnosis not present

## 2017-12-13 DIAGNOSIS — I10 Essential (primary) hypertension: Secondary | ICD-10-CM

## 2017-12-13 DIAGNOSIS — E039 Hypothyroidism, unspecified: Secondary | ICD-10-CM | POA: Insufficient documentation

## 2017-12-13 LAB — TSH: TSH: 8.73 u[IU]/mL — AB (ref 0.35–4.50)

## 2017-12-13 LAB — T4, FREE: Free T4: 0.6 ng/dL (ref 0.60–1.60)

## 2017-12-13 MED ORDER — OLMESARTAN MEDOXOMIL 40 MG PO TABS: 40.0000 mg | ORAL_TABLET | Freq: Every day | ORAL | 3 refills | Status: DC

## 2017-12-13 NOTE — Assessment & Plan Note (Signed)
F/u w/Dr Minna Merritts ASA, [facility] Heart cath on 11/24/17 - Mild nonobstructive CAD. Patent mid LAD stent with no restenosis)

## 2017-12-13 NOTE — Assessment & Plan Note (Signed)
D/c Lisinopril - cough/CP

## 2017-12-13 NOTE — Assessment & Plan Note (Signed)
S/p rad Iodine Rx

## 2017-12-13 NOTE — Progress Notes (Signed)
Subjective:  Patient ID: [name], female    DOB: [birth year]  Age: 52 y.o. MRN: [id number]  CC: No chief complaint on file.   HPI [name] presents for CP (Heart cath on 11/24/17 - Mild nonobstructive CAD. Patent mid LAD stent with no restenosis). C/o dry cough. No GERD... F/u hypothyroidism  Outpatient Medications Prior to Visit  Medication Sig Dispense Refill  . acetaminophen (TYLENOL) 500 MG tablet Take 1,000 mg by mouth every 6 (six) hours as needed for headache (pain).    Marland Kitchen amLODipine (NORVASC) 5 MG tablet Take 1 tablet (5 mg total) by mouth daily. 30 tablet 11  . aspirin EC 81 MG tablet Take 81 mg by mouth daily.    Marland Kitchen atenolol (TENORMIN) 25 MG tablet TAKE 1 TABLET (25 MG TOTAL) BY MOUTH DAILY. 90 tablet 3  . Cholecalciferol (VITAMIN D) 2000 units tablet Take 2,000 Units by mouth daily.    Marland Kitchen lisinopril (PRINIVIL,ZESTRIL) 40 MG tablet Take 1 tablet (40 mg total) by mouth daily. 90 tablet 3  . nitroGLYCERIN (NITROSTAT) 0.4 MG SL tablet Place 1 tablet (0.4 mg total) under the tongue every 5 (five) minutes as needed for chest pain. 25 tablet 1  . Pitavastatin Calcium (LIVALO) 2 MG TABS Take 1 tablet (2 mg total) by mouth daily. 30 tablet 11  . Polyethyl Glycol-Propyl Glycol (SYSTANE OP) Place 1 drop into both eyes 5 (five) times daily as needed (dry eyes).      No facility-administered medications prior to visit.     ROS Review of Systems  Constitutional: Negative for activity change, appetite change, chills, fatigue and unexpected weight change.  HENT: Negative for congestion, mouth sores and sinus pressure.   Eyes: Negative for visual disturbance.  Respiratory: Positive for cough, chest tightness and shortness of breath.   Cardiovascular: Positive for chest pain and palpitations. Negative for leg swelling.  Gastrointestinal: Negative for abdominal pain and nausea.  Genitourinary: Negative for difficulty urinating, frequency and vaginal pain.  Musculoskeletal:  Negative for back pain and gait problem.  Skin: Negative for pallor and rash.  Neurological: Negative for dizziness, tremors, weakness, numbness and headaches.  Psychiatric/Behavioral: Negative for confusion and sleep disturbance.    Objective:  BP 124/76 (BP Location: Right Arm, Patient Position: Sitting, Cuff Size: Large)   Pulse 63   Temp 98.4 F (36.9 C) (Oral)   Ht 5\' 2"  (1.575 m)   Wt 176 lb (79.8 kg)   LMP 11/17/2017   SpO2 99%   BMI 32.19 kg/m   BP Readings from Last 3 Encounters:  12/13/17 124/76  12/01/17 121/87  11/24/17 118/71    Wt Readings from Last 3 Encounters:  12/13/17 176 lb (79.8 kg)  11/24/17 173 lb 4.5 oz (78.6 kg)  04/10/17 169 lb (76.7 kg)    Physical Exam  Constitutional: She appears well-developed. No distress.  HENT:  Head: Normocephalic.  Right Ear: External ear normal.  Left Ear: External ear normal.  Nose: Nose normal.  Mouth/Throat: Oropharynx is clear and moist.  Eyes: Conjunctivae are normal. Pupils are equal, round, and reactive to light. Right eye exhibits no discharge. Left eye exhibits no discharge.  Neck: Normal range of motion. Neck supple. No JVD present. No tracheal deviation present. No thyromegaly present.  Cardiovascular: Normal rate, regular rhythm and normal heart sounds.  Pulmonary/Chest: No stridor. No respiratory distress. She has no wheezes.  Abdominal: Soft. Bowel sounds are normal. She exhibits no distension and no mass. There is no tenderness. There  is no rebound and no guarding.  Musculoskeletal: She exhibits no edema or tenderness.  Lymphadenopathy:    She has no cervical adenopathy.  Neurological: She displays normal reflexes. No cranial nerve deficit. She exhibits normal muscle tone. Coordination normal.  Skin: No rash noted. No erythema.  Psychiatric: She has a normal mood and affect. Her behavior is normal. Judgment and thought content normal.    Lab Results  Component Value Date   WBC 7.0 12/01/2017    HGB 13.3 12/01/2017   HCT 39.0 12/01/2017   PLT 272 12/01/2017   GLUCOSE 84 12/01/2017   CHOL 147 04/21/2017   TRIG 71.0 04/21/2017   HDL 57.80 04/21/2017   LDLCALC 75 04/21/2017   ALT 23 12/01/2017   AST 20 12/01/2017   NA 139 12/01/2017   K 3.9 12/01/2017   CL 102 12/01/2017   CREATININE 0.70 12/01/2017   BUN 9 12/01/2017   CO2 24 12/01/2017   TSH 8.16 (H) 04/21/2017   INR 1.15 05/22/2016   HGBA1C 5.6 12/04/2015    Dg Chest 2 View  Result Date: 12/01/2017 CLINICAL DATA:  Chest pain off and on for a week, BILATERAL lower back pain, hypertension, coronary artery disease post stenting EXAM: CHEST - 2 VIEW COMPARISON:  11/24/2017 FINDINGS: Upper normal heart size with note of coronary stent. Mediastinal contours and pulmonary vascularity normal. Lungs clear. No pleural effusion or pneumothorax. Bones unremarkable. IMPRESSION: No acute abnormalities. Electronically Signed   By: [name] M.D.   On: 12/01/2017 14:05    Assessment & Plan:   There are no diagnoses linked to this encounter. I am having [name] maintain her nitroGLYCERIN, aspirin EC, Vitamin D, Polyethyl Glycol-Propyl Glycol (SYSTANE OP), lisinopril, amLODipine, Pitavastatin Calcium, atenolol, and acetaminophen.  No orders of the defined types were placed in this encounter.    Follow-up: No Follow-up on file.  Walker Kehr, MD

## 2017-12-13 NOTE — Patient Instructions (Signed)
Stop Lisinopril.

## 2017-12-13 NOTE — Assessment & Plan Note (Signed)
D/c Lisinopril - cough/CP Star Benicar

## 2017-12-14 ENCOUNTER — Other Ambulatory Visit: Payer: Self-pay | Admitting: Internal Medicine

## 2017-12-14 ENCOUNTER — Telehealth: Payer: Self-pay | Admitting: Internal Medicine

## 2017-12-14 DIAGNOSIS — E0580 Other thyrotoxicosis without thyrotoxic crisis or storm: Secondary | ICD-10-CM

## 2017-12-14 MED ORDER — LEVOTHYROXINE SODIUM 25 MCG PO TABS: 25.0000 ug | ORAL_TABLET | Freq: Every day | ORAL | 11 refills | Status: DC

## 2017-12-14 NOTE — Telephone Encounter (Signed)
Copied from [place] [id number]. Topic: Quick Communication - Rx Refill/Question >> Dec 14, 2017  4:21 PM Neva Seat wrote: Pt needing to discuss her bp that was recently prescribed to her. Pharmacy states it's on national backorder.   Pt wants to know if there is another BP to take that isn't on backorder. Please call pt to discuss asap.

## 2017-12-15 ENCOUNTER — Inpatient Hospital Stay: Payer: PRIVATE HEALTH INSURANCE | Admitting: Internal Medicine

## 2017-12-15 NOTE — Telephone Encounter (Signed)
Please advise 

## 2017-12-17 NOTE — Telephone Encounter (Signed)
What medication? Thanks

## 2017-12-19 ENCOUNTER — Encounter: Payer: Self-pay | Admitting: Endocrinology

## 2017-12-19 ENCOUNTER — Ambulatory Visit: Payer: PRIVATE HEALTH INSURANCE | Admitting: Endocrinology

## 2017-12-19 VITALS — BP 112/70 | HR 65 | Wt 176.0 lb

## 2017-12-19 DIAGNOSIS — E890 Postprocedural hypothyroidism: Secondary | ICD-10-CM

## 2017-12-19 MED ORDER — TELMISARTAN 80 MG PO TABS: 80.0000 mg | ORAL_TABLET | Freq: Every day | ORAL | 3 refills | Status: DC

## 2017-12-19 MED ORDER — LEVOTHYROXINE SODIUM 25 MCG PO TABS: 25.0000 ug | ORAL_TABLET | Freq: Every day | ORAL | 3 refills | Status: DC

## 2017-12-19 NOTE — Telephone Encounter (Signed)
Ok Micardis 80 mg/d instead (12 months) Thx

## 2017-12-19 NOTE — Telephone Encounter (Signed)
RX sent

## 2017-12-19 NOTE — Patient Instructions (Addendum)
Please fill the thyroid prescription, and take 1 per day. Please then recheck the blood test in 1 month. Please come back for a follow-up appointment in 6 months.

## 2017-12-19 NOTE — Progress Notes (Signed)
Subjective:    Patient ID: [name], female    DOB: 1965-10-18, 52 y.o.   MRN: [id number]  HPI Pt returns for f/u of post-RAI hypothyroidism (in 2010, pt had RAI rx for hyperthyroidism; she has never had [place], but nuc med scan showed diffuse uptake; she was rx'ed low-dose synthroid, but she never took; f/u US in 2014 was normal).  She has slight palpitations in the chest, and assoc fatigue.  Past Medical History:  Diagnosis Date  . Acute pancreatitis 2011  . Allergy   . Anemia   . CAD (coronary artery disease) 2009   Dr Olegario Shearer -[facility], Heart Cath  . Family history of adverse reaction to anesthesia    "mother and brother have difficulty waking up out from it"  . GERD (gastroesophageal reflux disease)   . Hyperlipidemia   . Hypertension   . Hypothyroidism    "recently dx'd" (12/07/2015)  . PVC (premature ventricular contraction)   . Statin intolerance   . STD (sexually transmitted disease)    CHL 20 yrs ago  . Thyroid nodule 2010   "precancerous; thyroid treated with radiation"  . Tubular adenoma of colon 06/2015    Past Surgical History:  Procedure Laterality Date  . CARDIAC CATHETERIZATION     unable to do cardiac cath but does stress echo 11/22/11  . CARDIAC CATHETERIZATION N/A 12/04/2015   Procedure: Left Heart Cath and Coronary Angiography;  Surgeon: Adrian Prows, MD;  Location: [facility];  Service: Cardiovascular;  Laterality: N/A;  . CARDIAC CATHETERIZATION N/A 12/04/2015   Procedure: Intravascular Pressure Wire/FFR Study;  Surgeon: Adrian Prows, MD;  Location: [facility];  Service: Cardiovascular;  Laterality: N/A;  . CARDIAC CATHETERIZATION N/A 12/07/2015   Procedure: Coronary Stent Intervention;  Surgeon: Adrian Prows, MD;  Location: [facility];  Service: Cardiovascular;  Laterality: N/A;  . EYE MUSCLE SURGERY Left 1969  . LAPAROSCOPIC CHOLECYSTECTOMY  2008  . LEFT HEART CATH AND CORONARY ANGIOGRAPHY N/A 11/24/2017   Procedure: LEFT HEART CATH  AND CORONARY ANGIOGRAPHY;  Surgeon: Nigel Mormon, MD;  Location: [facility];  Service: Cardiovascular;  Laterality: N/A;  . TUBAL LIGATION Bilateral 1995  . UPPER GASTROINTESTINAL ENDOSCOPY    . VENTRICULAR ABLATION SURGERY  2006,2010   times 2, follow-up for v-tack    Social History   Socioeconomic History  . Marital status: Divorced    Spouse name: Not on file  . Number of children: 4  . Years of education: Not on file  . Highest education level: Not on file  Occupational History  . Occupation: Solicitor: [employer]  . Occupation: Ship broker    Comment: F/T  Social Needs  . Financial resource strain: Not on file  . Food insecurity:    Worry: Not on file    Inability: Not on file  . Transportation needs:    Medical: Not on file    Non-medical: Not on file  Tobacco Use  . Smoking status: Never Smoker  . Smokeless tobacco: Never Used  Substance and Sexual Activity  . Alcohol use: No  . Drug use: No  . Sexual activity: Not Currently    Partners: Male    Birth control/protection: Surgical, Abstinence    Comment: BTL  Lifestyle  . Physical activity:    Days per week: Not on file    Minutes per session: Not on file  . Stress: Not on file  Relationships  .  Social connections:    Talks on phone: Not on file    Gets together: Not on file    Attends religious service: Not on file    Active member of club or organization: Not on file    Attends meetings of clubs or organizations: Not on file    Relationship status: Not on file  . Intimate partner violence:    Fear of current or ex partner: Not on file    Emotionally abused: Not on file    Physically abused: Not on file    Forced sexual activity: Not on file  Other Topics Concern  . Not on file  Social History Narrative   FAMILY HISTORY   History of hypertension   History of prostate cancer 1st degree relative <50   D, S ovar. CA      Regular exercise - YES      GYN Dr  Posey Pronto - [place]    Current Outpatient Medications on File Prior to Visit  Medication Sig Dispense Refill  . acetaminophen (TYLENOL) 500 MG tablet Take 1,000 mg by mouth every 6 (six) hours as needed for headache (pain).    Marland Kitchen amLODipine (NORVASC) 5 MG tablet Take 1 tablet (5 mg total) by mouth daily. 30 tablet 11  . aspirin EC 81 MG tablet Take 81 mg by mouth daily.    Marland Kitchen atenolol (TENORMIN) 25 MG tablet TAKE 1 TABLET (25 MG TOTAL) BY MOUTH DAILY. 90 tablet 3  . Cholecalciferol (VITAMIN D) 2000 units tablet Take 2,000 Units by mouth daily.    . nitroGLYCERIN (NITROSTAT) 0.4 MG SL tablet Place 1 tablet (0.4 mg total) under the tongue every 5 (five) minutes as needed for chest pain. 25 tablet 1  . Pitavastatin Calcium (LIVALO) 2 MG TABS Take 1 tablet (2 mg total) by mouth daily. 30 tablet 11  . Polyethyl Glycol-Propyl Glycol (SYSTANE OP) Place 1 drop into both eyes 5 (five) times daily as needed (dry eyes).     Marland Kitchen telmisartan (MICARDIS) 80 MG tablet Take 1 tablet (80 mg total) by mouth daily. 90 tablet 3   No current facility-administered medications on file prior to visit.     Allergies  Allergen Reactions  . Crestor [Rosuvastatin Calcium] Other (See Comments)    Muscle aches and pains  . Lipitor [Atorvastatin] Other (See Comments)    Muscle aches and pains  . Lisinopril Cough    Family History  Problem Relation Age of Onset  . Hypertension Father   . Prostate cancer Father   . Heart attack Father   . Colon cancer Father 52  . Hypertension Mother   . Diabetes Mother   . Crohn's disease Daughter   . Heart attack Cousin   . Diabetes Maternal Grandmother   . Cancer Sister        ovarian  . Diabetes Brother   . Esophageal cancer Neg Hx   . Rectal cancer Neg Hx   . Stomach cancer Neg Hx     BP 112/70 (BP Location: Left Arm, Patient Position: Sitting, Cuff Size: Normal)   Pulse 65   Wt 176 lb (79.8 kg)   SpO2 98%   BMI 32.19 kg/m    Review of Systems No change in slight  diffuse swelling.  She reports weight gain despite good diet.    Objective:   Physical Exam VITAL SIGNS:  See vs page GENERAL: no distress NECK: There is no palpable thyroid enlargement.  No thyroid nodule is palpable.  No  palpable lymphadenopathy at the anterior neck.   Lab Results  Component Value Date   TSH 8.73 (H) 12/13/2017      Assessment & Plan:  Post-RAI hypothyroidism, mild fatigue, and other sxs.  She should take synthroid despite minimal TSH elevation.   Patient Instructions  Please fill the thyroid prescription, and take 1 per day. Please then recheck the blood test in 1 month. Please come back for a follow-up appointment in 6 months.

## 2017-12-19 NOTE — Telephone Encounter (Signed)
benicar 40

## 2018-01-24 ENCOUNTER — Telehealth: Payer: Self-pay | Admitting: Internal Medicine

## 2018-01-24 DIAGNOSIS — E034 Atrophy of thyroid (acquired): Secondary | ICD-10-CM

## 2018-01-24 NOTE — Telephone Encounter (Signed)
Copied from [place] [id number]. Topic: Referral - Request >> Jan 24, 2018  3:43 PM Margot Ables wrote: Reason for CRM: pt is wanting a referral to [facility] as it is in her network and less expensive for her. She was advised by the office they need referral from PCP. Dr. Alanson Aly [address] [place], [place] [address]  Phone [phone]

## 2018-01-25 NOTE — Telephone Encounter (Signed)
Ok Thx 

## 2018-01-25 NOTE — Telephone Encounter (Signed)
Called pt no answer LMOM MD placed referral for endocrinologist w/Dr. Meredith Pel.Marland KitchenJohny Chess

## 2018-02-13 DIAGNOSIS — I493 Ventricular premature depolarization: Secondary | ICD-10-CM | POA: Insufficient documentation

## 2018-03-15 ENCOUNTER — Ambulatory Visit: Payer: PRIVATE HEALTH INSURANCE | Admitting: Internal Medicine

## 2018-05-18 ENCOUNTER — Other Ambulatory Visit: Payer: Self-pay | Admitting: Internal Medicine

## 2018-05-18 DIAGNOSIS — I2510 Atherosclerotic heart disease of native coronary artery without angina pectoris: Secondary | ICD-10-CM

## 2018-05-18 DIAGNOSIS — E785 Hyperlipidemia, unspecified: Secondary | ICD-10-CM

## 2018-06-08 ENCOUNTER — Encounter: Payer: Self-pay | Admitting: Internal Medicine

## 2018-06-08 ENCOUNTER — Ambulatory Visit: Payer: PRIVATE HEALTH INSURANCE | Admitting: Internal Medicine

## 2018-06-08 VITALS — BP 128/84 | HR 73 | Temp 98.3°F | Ht 62.0 in | Wt 177.0 lb

## 2018-06-08 DIAGNOSIS — J069 Acute upper respiratory infection, unspecified: Secondary | ICD-10-CM

## 2018-06-08 DIAGNOSIS — I2510 Atherosclerotic heart disease of native coronary artery without angina pectoris: Secondary | ICD-10-CM

## 2018-06-08 DIAGNOSIS — I10 Essential (primary) hypertension: Secondary | ICD-10-CM | POA: Diagnosis not present

## 2018-06-08 DIAGNOSIS — R05 Cough: Secondary | ICD-10-CM

## 2018-06-08 DIAGNOSIS — R059 Cough, unspecified: Secondary | ICD-10-CM

## 2018-06-08 LAB — POC INFLUENZA A&B (BINAX/QUICKVUE)
INFLUENZA A, POC: NEGATIVE
Influenza B, POC: NEGATIVE

## 2018-06-08 MED ORDER — AZITHROMYCIN 250 MG PO TABS: ORAL_TABLET | ORAL | 0 refills | Status: DC

## 2018-06-08 MED ORDER — PROMETHAZINE-CODEINE 6.25-10 MG/5ML PO SYRP: 5.0000 mL | ORAL_SOLUTION | ORAL | 0 refills | Status: DC | PRN

## 2018-06-08 NOTE — Addendum Note (Signed)
Addended by: Karren Cobble on: 06/08/2018 11:49 AM   Modules accepted: Orders

## 2018-06-08 NOTE — Progress Notes (Signed)
Subjective:  Patient ID: [name], female    DOB: 18-Oct-1965  Age: 52 y.o. MRN: [id number]  CC: No chief complaint on file.   HPI DALIAH CHAUDOIN presents for URI. Sx's x 2 d. T 102 C/o earache R>L  Outpatient Medications Prior to Visit  Medication Sig Dispense Refill  . acetaminophen (TYLENOL) 500 MG tablet Take 1,000 mg by mouth every 6 (six) hours as needed for headache (pain).    Marland Kitchen amLODipine (NORVASC) 5 MG tablet Take 1 tablet (5 mg total) by mouth daily. 30 tablet 11  . aspirin EC 81 MG tablet Take 81 mg by mouth daily.    Marland Kitchen atenolol (TENORMIN) 25 MG tablet TAKE 1 TABLET (25 MG TOTAL) BY MOUTH DAILY. 90 tablet 3  . Cholecalciferol (VITAMIN D) 2000 units tablet Take 2,000 Units by mouth daily.    Marland Kitchen levothyroxine (SYNTHROID) 25 MCG tablet Take 1 tablet (25 mcg total) by mouth daily before breakfast. 90 tablet 3  . LIVALO 2 MG TABS TAKE 1 TABLET (2 MG TOTAL) BY MOUTH DAILY. 30 tablet 11  . nitroGLYCERIN (NITROSTAT) 0.4 MG SL tablet Place 1 tablet (0.4 mg total) under the tongue every 5 (five) minutes as needed for chest pain. 25 tablet 1  . Polyethyl Glycol-Propyl Glycol (SYSTANE OP) Place 1 drop into both eyes 5 (five) times daily as needed (dry eyes).     Marland Kitchen telmisartan (MICARDIS) 80 MG tablet Take 1 tablet (80 mg total) by mouth daily. 90 tablet 3   No facility-administered medications prior to visit.     ROS: Review of Systems  Constitutional: Positive for chills, fatigue and fever. Negative for activity change, appetite change and unexpected weight change.  HENT: Positive for congestion, sinus pain and sore throat. Negative for mouth sores and sinus pressure.   Eyes: Negative for visual disturbance.  Respiratory: Positive for cough. Negative for chest tightness and shortness of breath.   Gastrointestinal: Negative for abdominal pain and nausea.  Genitourinary: Negative for difficulty urinating, frequency and vaginal pain.  Musculoskeletal: Negative for back pain  and gait problem.  Skin: Negative for pallor and rash.  Neurological: Negative for dizziness, tremors, weakness, numbness and headaches.  Psychiatric/Behavioral: Negative for confusion, sleep disturbance and suicidal ideas.    Objective:  BP 128/84 (BP Location: Left Arm, Patient Position: Sitting, Cuff Size: Large)   Pulse 73   Temp 98.3 F (36.8 C) (Oral)   Ht 5\' 2"  (1.575 m)   Wt 177 lb (80.3 kg)   SpO2 96%   BMI 32.37 kg/m   BP Readings from Last 3 Encounters:  06/08/18 128/84  12/19/17 112/70  12/13/17 124/76    Wt Readings from Last 3 Encounters:  06/08/18 177 lb (80.3 kg)  12/19/17 176 lb (79.8 kg)  12/13/17 176 lb (79.8 kg)    Physical Exam  Constitutional: She appears well-developed. No distress.  HENT:  Head: Normocephalic.  Right Ear: External ear normal.  Left Ear: External ear normal.  Mouth/Throat: Oropharynx is clear and moist.  Eyes: Pupils are equal, round, and reactive to light. Conjunctivae are normal. Right eye exhibits no discharge. Left eye exhibits no discharge.  Neck: Normal range of motion. Neck supple. No JVD present. No tracheal deviation present. No thyromegaly present.  Cardiovascular: Normal rate, regular rhythm and normal heart sounds.  Pulmonary/Chest: No stridor. No respiratory distress. She has no wheezes.  Abdominal: Soft. Bowel sounds are normal. She exhibits no distension and no mass. There is no tenderness. There is  no rebound and no guarding.  Musculoskeletal: She exhibits no edema or tenderness.  Lymphadenopathy:    She has no cervical adenopathy.  Neurological: She displays normal reflexes. No cranial nerve deficit. She exhibits normal muscle tone. Coordination normal.  Skin: No rash noted. No erythema.  Psychiatric: She has a normal mood and affect. Her behavior is normal. Judgment and thought content normal.  eryth thrat R TM red L w/fluid   Lab Results  Component Value Date   WBC 7.0 12/01/2017   HGB 13.3 12/01/2017    HCT 39.0 12/01/2017   PLT 272 12/01/2017   GLUCOSE 84 12/01/2017   CHOL 147 04/21/2017   TRIG 71.0 04/21/2017   HDL 57.80 04/21/2017   LDLCALC 75 04/21/2017   ALT 23 12/01/2017   AST 20 12/01/2017   NA 139 12/01/2017   K 3.9 12/01/2017   CL 102 12/01/2017   CREATININE 0.70 12/01/2017   BUN 9 12/01/2017   CO2 24 12/01/2017   TSH 8.73 (H) 12/13/2017   INR 1.15 05/22/2016   HGBA1C 5.6 12/04/2015    Dg Chest 2 View  Result Date: 12/01/2017 CLINICAL DATA:  Chest pain off and on for a week, BILATERAL lower back pain, hypertension, coronary artery disease post stenting EXAM: CHEST - 2 VIEW COMPARISON:  11/24/2017 FINDINGS: Upper normal heart size with note of coronary stent. Mediastinal contours and pulmonary vascularity normal. Lungs clear. No pleural effusion or pneumothorax. Bones unremarkable. IMPRESSION: No acute abnormalities. Electronically Signed   By: Lavonia Dana M.D.   On: 12/01/2017 14:05    Assessment & Plan:   There are no diagnoses linked to this encounter.   No orders of the defined types were placed in this encounter.    Follow-up: No follow-ups on file.  Walker Kehr, MD

## 2018-06-08 NOTE — Assessment & Plan Note (Signed)
Prom - cod Z pac if the ear is worse

## 2018-06-08 NOTE — Patient Instructions (Signed)
You can use over-the-counter  "cold" medicines  such as "Afrin" nasal spray for nasal congestion as directed. Use " Delsym" or" Robitussin" cough syrup varietis for cough.  You can use plain "Tylenol" or "Advil" for fever, chills and achyness. Use Halls or Ricola cough drops.   "Common cold" symptoms are usually triggered by a virus.  The antibiotics are usually not necessary. On average, a" viral cold" illness would take 4-7 days to resolve.   Please, make an appointment if you are not better or if you're worse.  

## 2018-06-08 NOTE — Assessment & Plan Note (Signed)
Atenolol, Benicar, Norvasc

## 2018-06-08 NOTE — Assessment & Plan Note (Signed)
No angina 

## 2018-09-05 ENCOUNTER — Other Ambulatory Visit: Payer: Self-pay | Admitting: Internal Medicine

## 2018-11-06 ENCOUNTER — Other Ambulatory Visit: Payer: Self-pay | Admitting: Internal Medicine

## 2018-12-05 ENCOUNTER — Other Ambulatory Visit: Payer: Self-pay | Admitting: Internal Medicine

## 2019-01-11 ENCOUNTER — Ambulatory Visit (INDEPENDENT_AMBULATORY_CARE_PROVIDER_SITE_OTHER): Payer: PRIVATE HEALTH INSURANCE | Admitting: Internal Medicine

## 2019-01-11 ENCOUNTER — Encounter: Payer: Self-pay | Admitting: Internal Medicine

## 2019-01-11 ENCOUNTER — Other Ambulatory Visit: Payer: Self-pay

## 2019-01-11 DIAGNOSIS — R0789 Other chest pain: Secondary | ICD-10-CM | POA: Diagnosis not present

## 2019-01-11 MED ORDER — CYCLOBENZAPRINE HCL 10 MG PO TABS: 10.0000 mg | ORAL_TABLET | Freq: Three times a day (TID) | ORAL | 0 refills | Status: DC | PRN

## 2019-01-11 NOTE — Progress Notes (Signed)
Virtual Visit via Video Note  I connected with [name] on 01/11/19 at  1:40 PM EDT by a video enabled telemedicine application and verified that I am speaking with the correct person using two identifiers.   I discussed the limitations of evaluation and management by telemedicine and the availability of in person appointments. The patient expressed understanding and agreed to proceed.  History of Present Illness: The patient is a 53 y.o. female with visit for right sided chest wall pain. Had a fall last week and hit the left shoulder, denies LOC or head injury. Did not seek care. Some soreness in the left shoulder since that time. Overall that is improving gradually. She then got some right sided chest wall/side pain about 2-3 days ago. She denies cough or fevers or chills. Denies SOB. Does have some more pain with stretching and bending and deep breathing. Denies change in weight. Has no external bruising. Denies any other symptoms of coronavirus. Overall it is stable since onset. Has tried tylenol which did not help. She does have CAD and denies this feeling similar to past problems and has not tried nitroglycerin for this. She has had pancreatitis in the past and this also does not feel similar to that.   Observations/Objective: Appearance: normal, breathing appears normal, work grooming, abdomen does not appear distended, throat normal, mental status is A and O times 3  Assessment and Plan: See problem oriented charting  Follow Up Instructions: rx for flexeril and observe symptoms  I discussed the assessment and treatment plan with the patient. The patient was provided an opportunity to ask questions and all were answered. The patient agreed with the plan and demonstrated an understanding of the instructions.   The patient was advised to call back or seek an in-person evaluation if the symptoms worsen or if the condition fails to improve as anticipated.  Hoyt Koch, MD

## 2019-01-11 NOTE — Assessment & Plan Note (Signed)
Likely musculoskeletal from fall last week. Does not sound like rib fracture as no direct impact but we did discuss how this would not impact treatment and chest x-ray is not needed. Discussed rare possibility of early pneumonia and if she develops cough or fever to call the office back. Rx for flexeril.

## 2019-01-16 ENCOUNTER — Other Ambulatory Visit: Payer: Self-pay | Admitting: Internal Medicine

## 2019-02-13 ENCOUNTER — Other Ambulatory Visit: Payer: Self-pay | Admitting: Internal Medicine

## 2019-02-27 IMAGING — DX DG CHEST 2V
2 series · 2 of 2 positions shown · non-contrast
Comparison: 12/03/2015

CLINICAL DATA: Chest pain shortness of breath

EXAM:
CHEST  2 VIEW

[w chest pa]
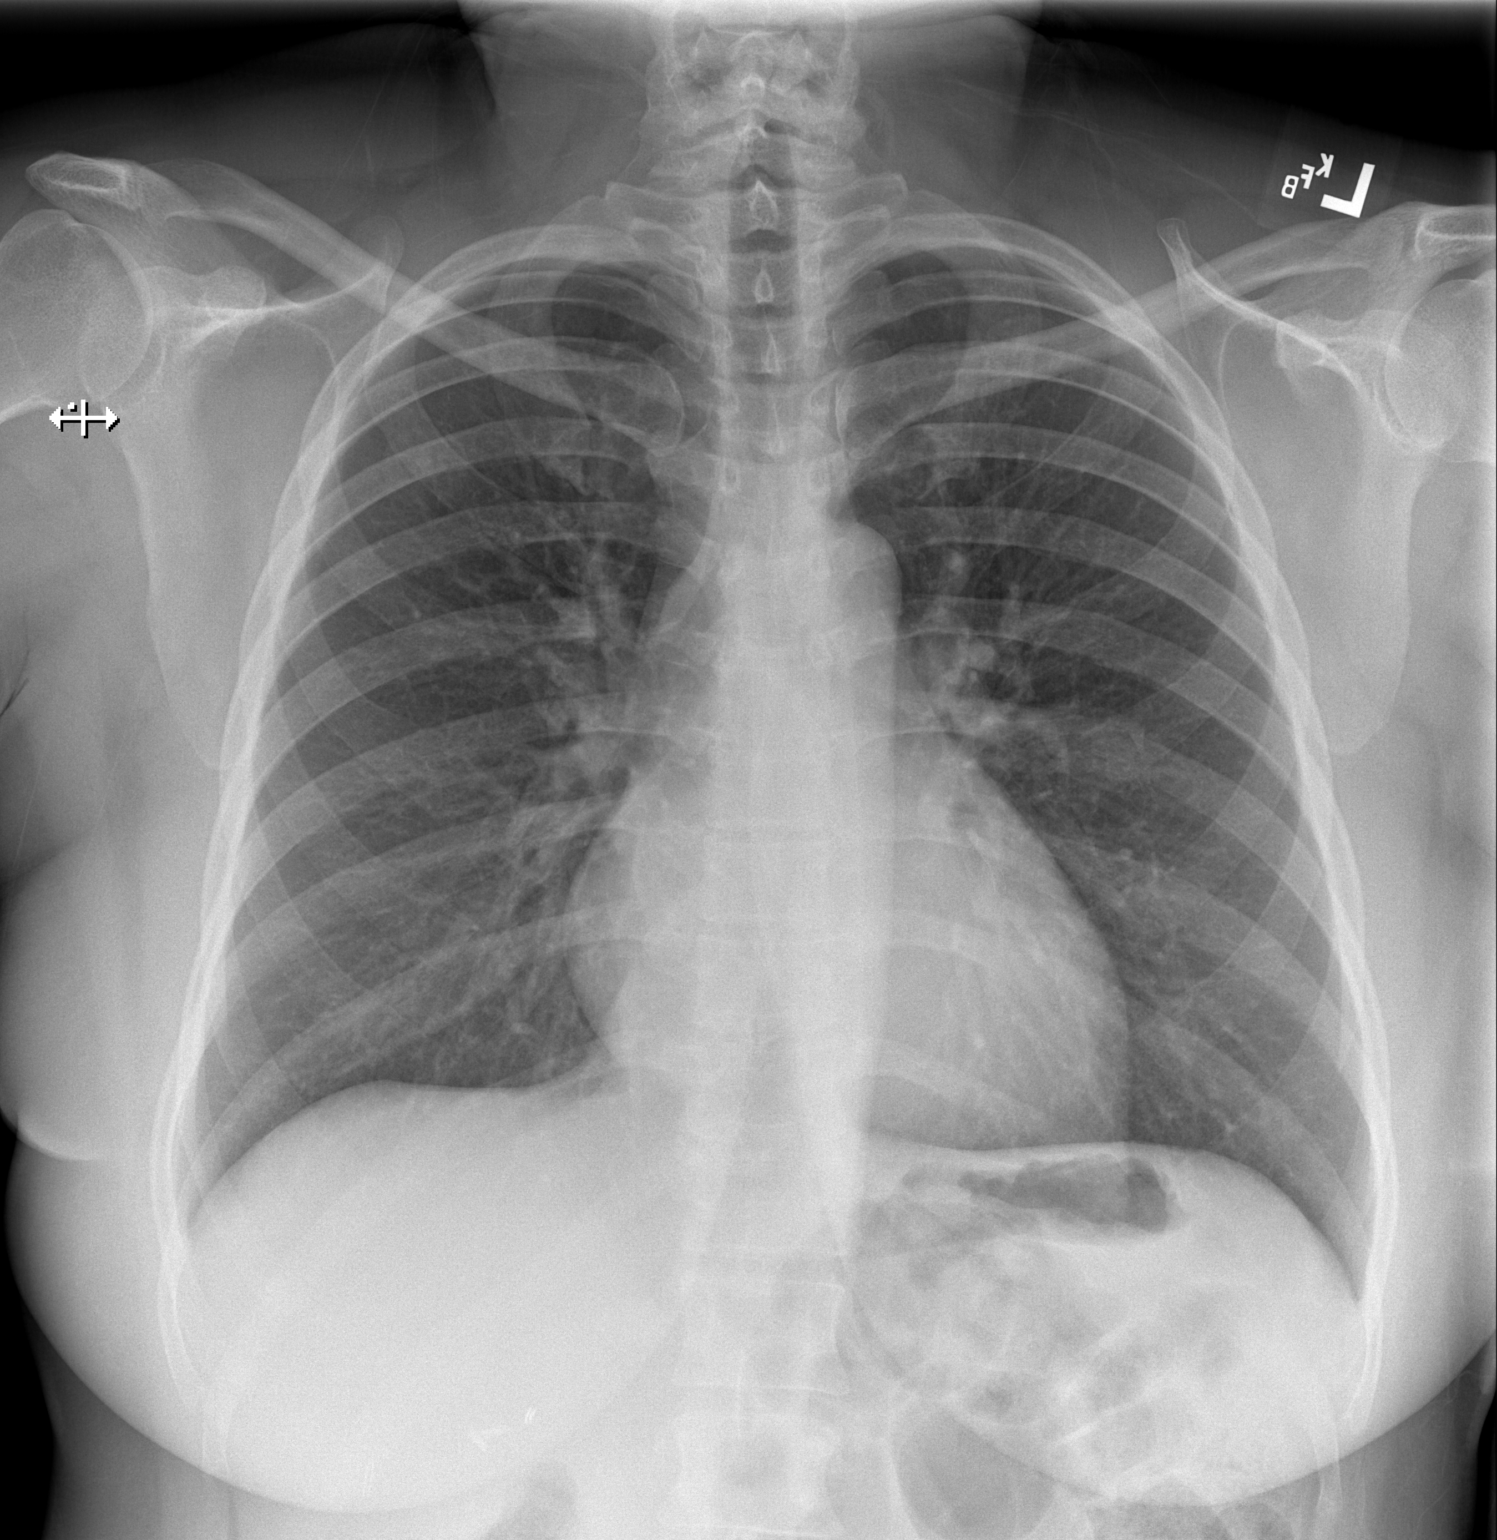

[w chest lat]
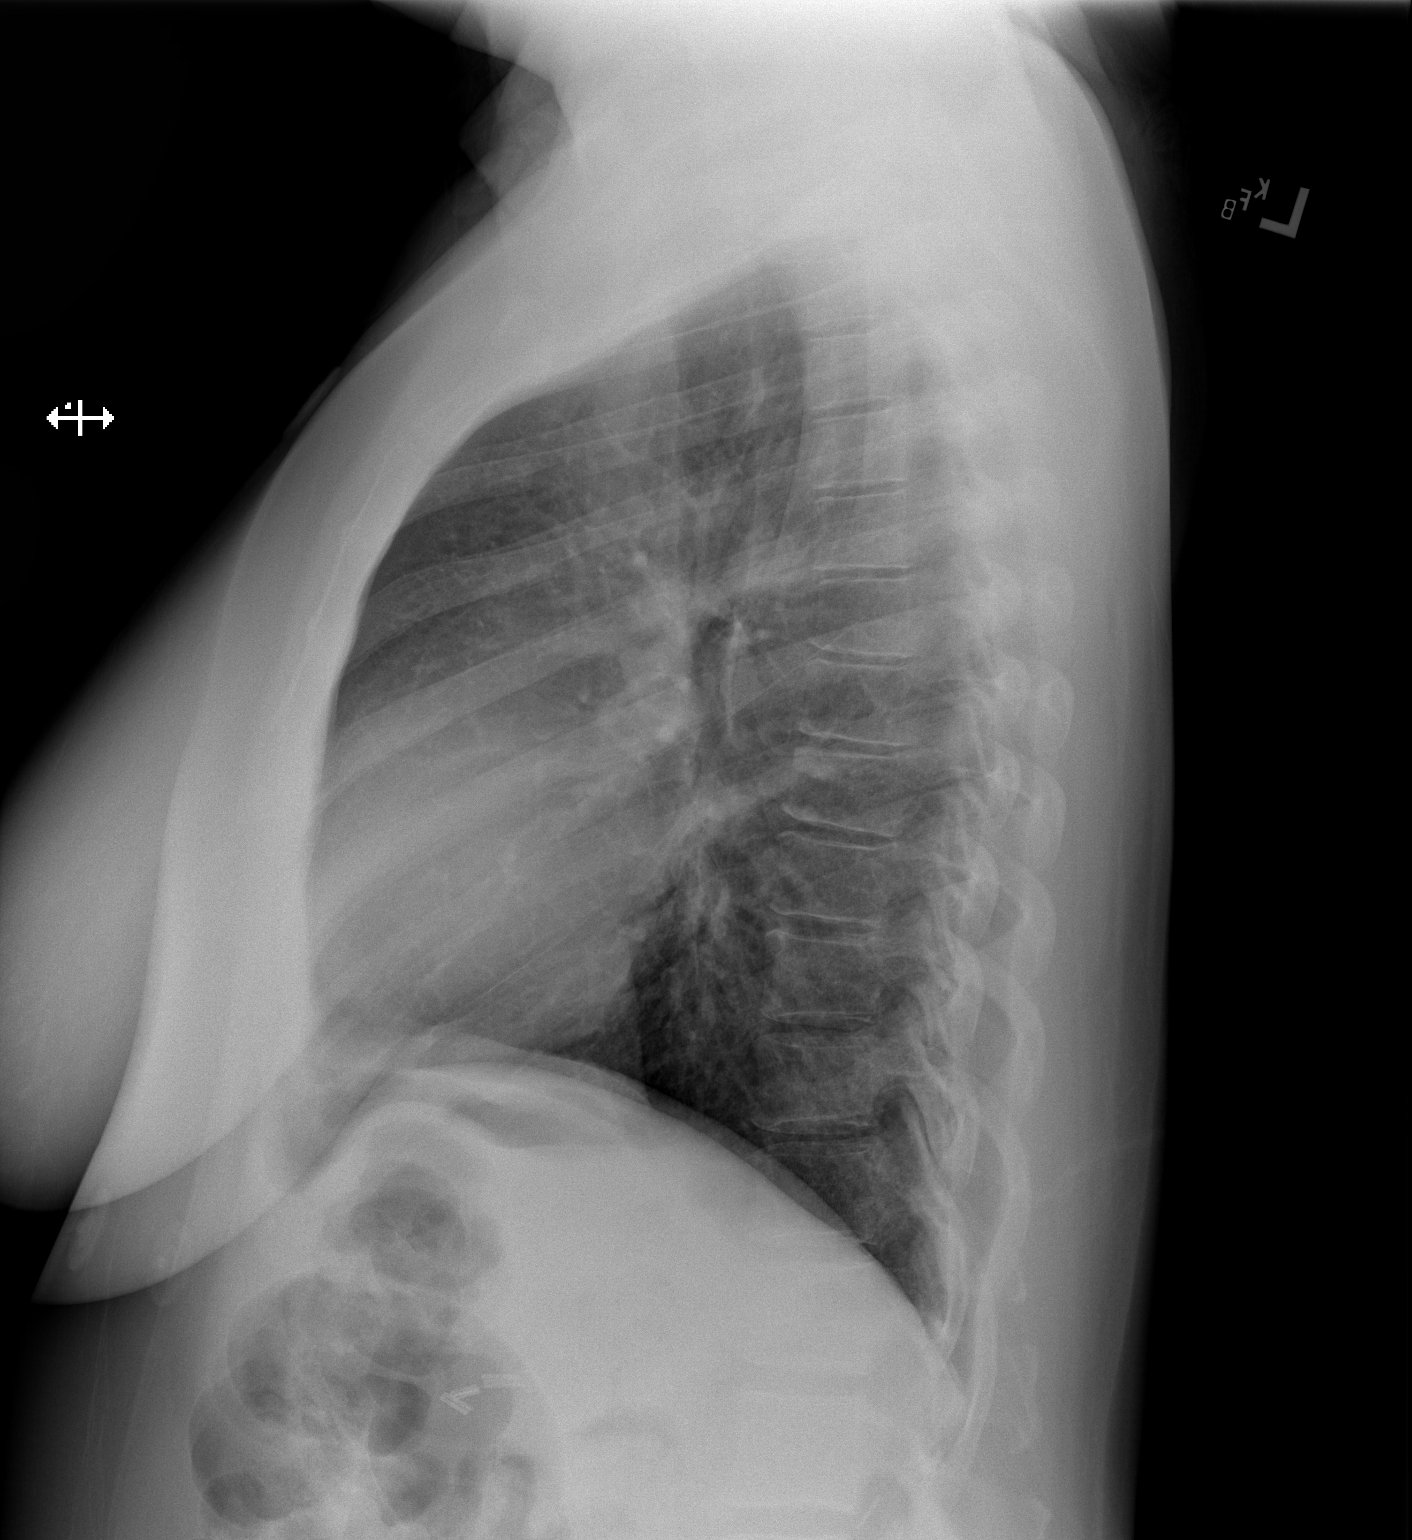

[2 of 2 positions shown; findings below may reference images not displayed]

FINDINGS: The heart size and mediastinal contours are within normal limits.
Both lungs are clear. The visualized skeletal structures are
unremarkable. Remote cholecystectomy noted. Trachea is midline.
IMPRESSION: No active cardiopulmonary disease.

## 2019-03-06 IMAGING — CR DG CHEST 2V
2 series · 2 of 2 positions shown · non-contrast
Comparison: 11/24/2017

CLINICAL DATA: Chest pain off and on for a week, BILATERAL lower
back pain, hypertension, coronary artery disease post stenting

EXAM:
CHEST - 2 VIEW

[chest pa]
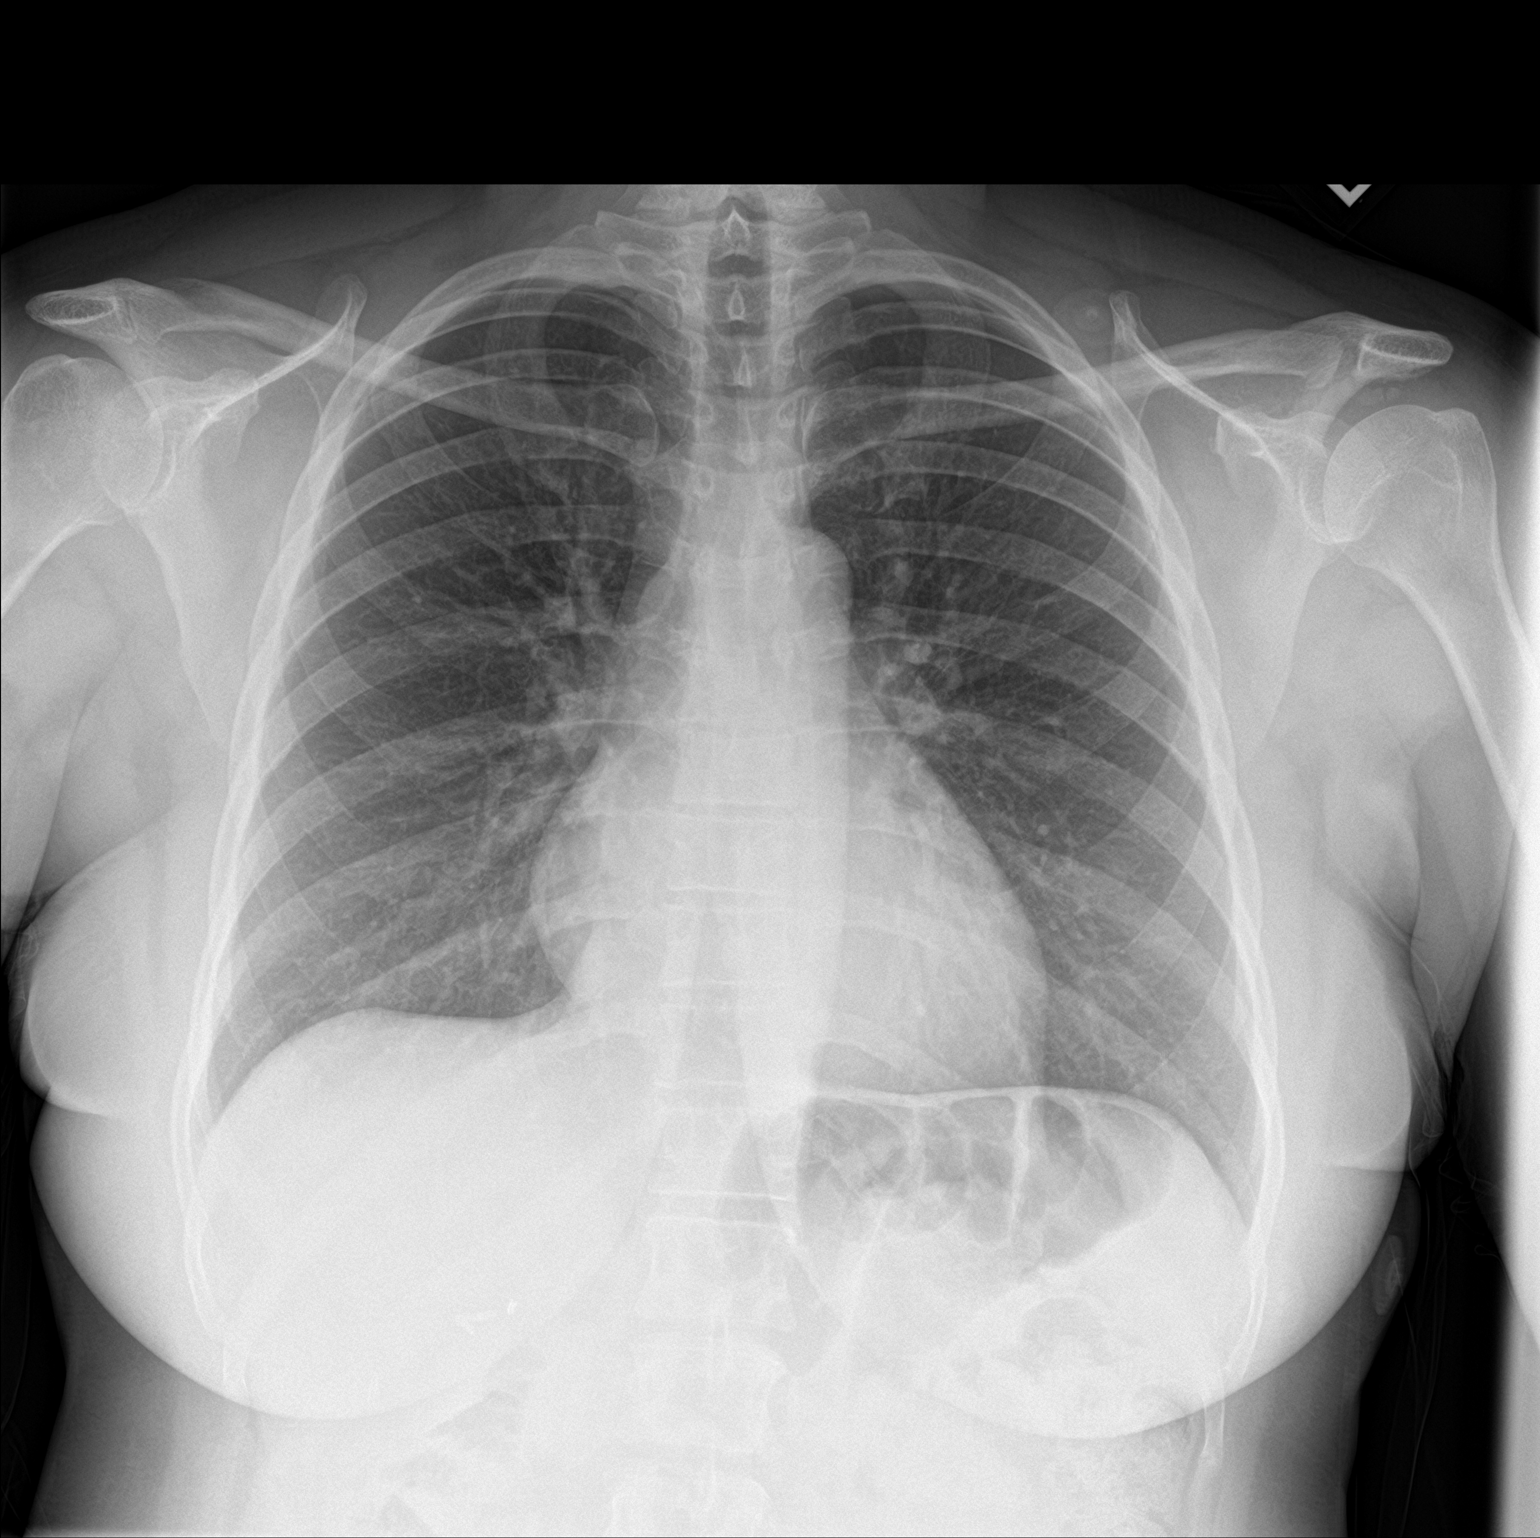

[chest lat]
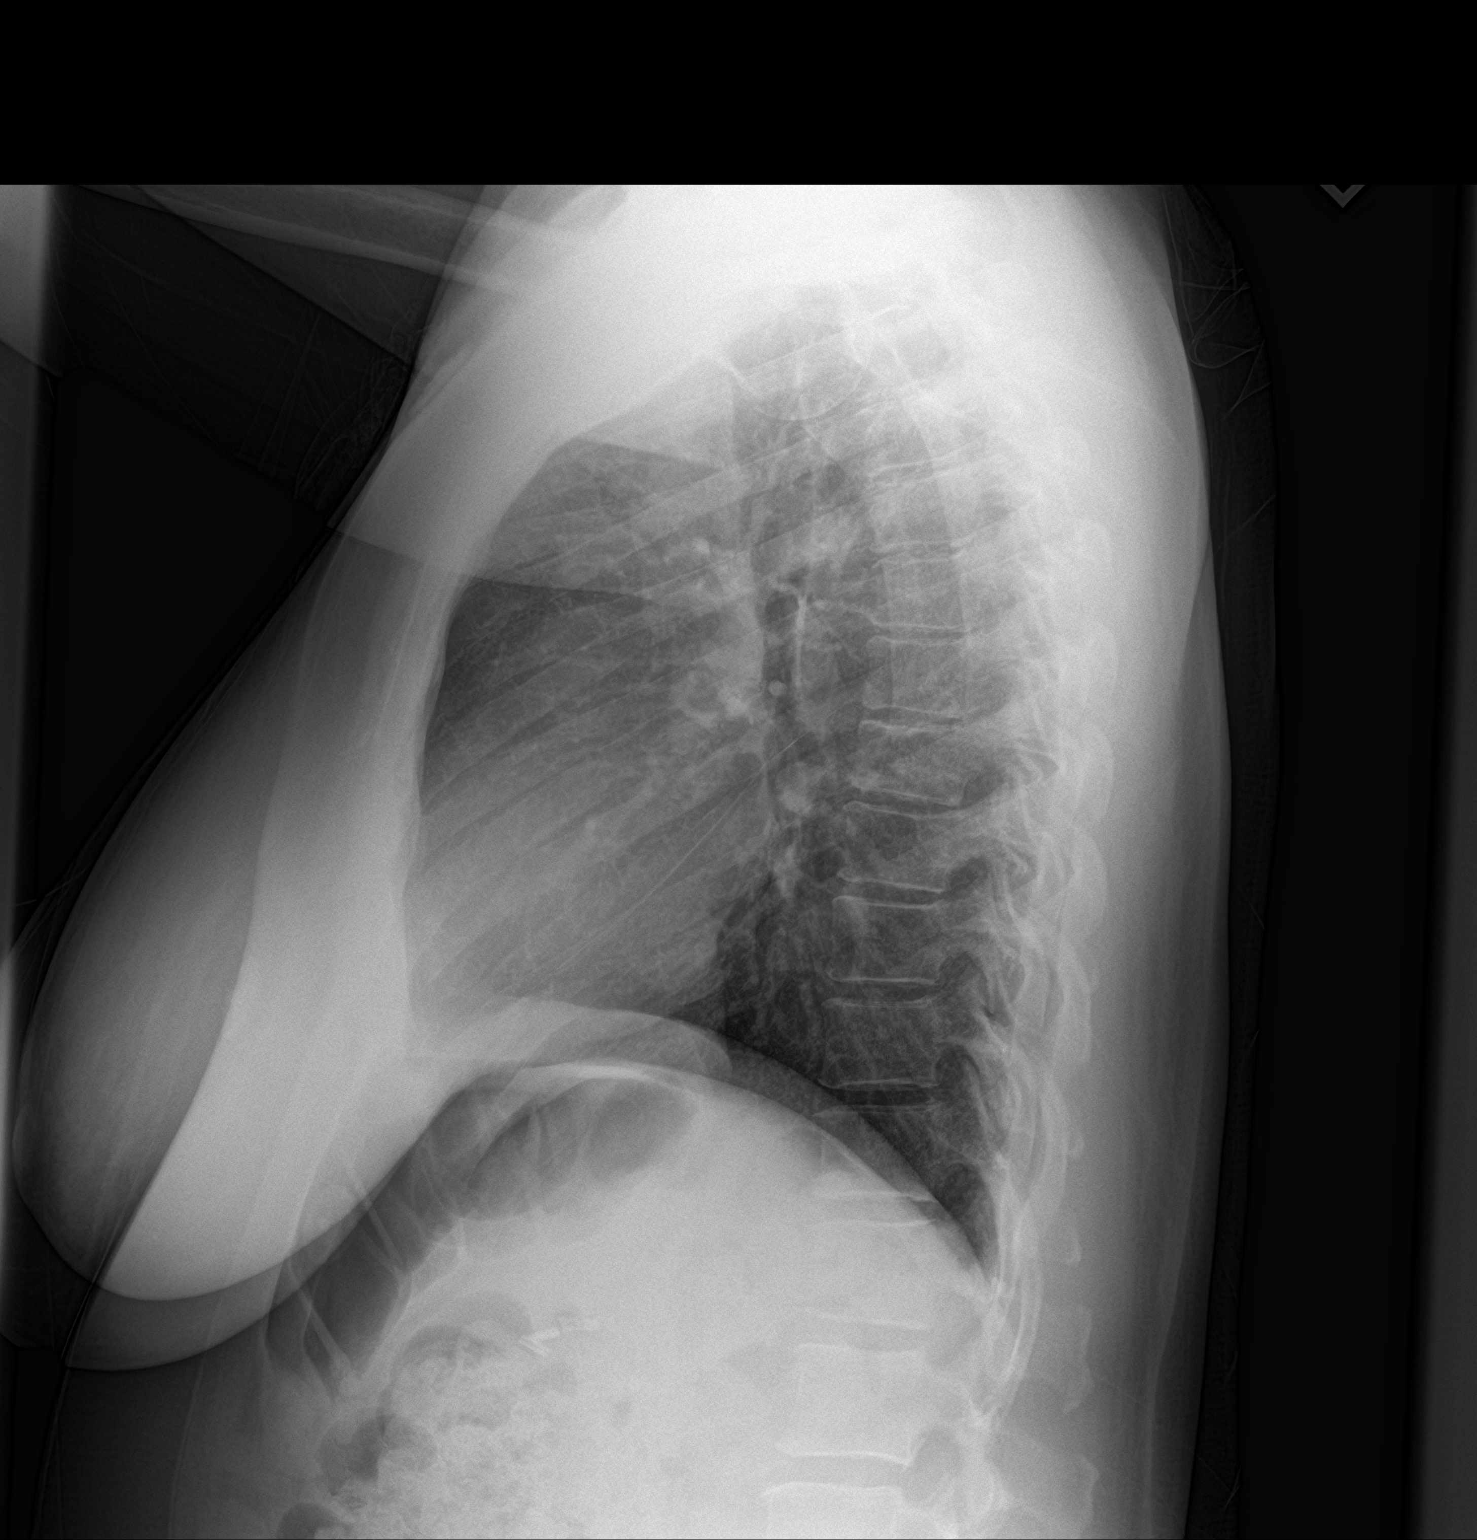

[2 of 2 positions shown; findings below may reference images not displayed]

FINDINGS: Upper normal heart size with note of coronary stent.

Mediastinal contours and pulmonary vascularity normal.

Lungs clear.

No pleural effusion or pneumothorax.

Bones unremarkable.
IMPRESSION: No acute abnormalities.

## 2019-03-11 ENCOUNTER — Other Ambulatory Visit: Payer: Self-pay | Admitting: Internal Medicine

## 2019-03-19 ENCOUNTER — Ambulatory Visit (INDEPENDENT_AMBULATORY_CARE_PROVIDER_SITE_OTHER): Payer: PRIVATE HEALTH INSURANCE | Admitting: Internal Medicine

## 2019-03-19 ENCOUNTER — Encounter: Payer: Self-pay | Admitting: Internal Medicine

## 2019-03-19 ENCOUNTER — Other Ambulatory Visit (INDEPENDENT_AMBULATORY_CARE_PROVIDER_SITE_OTHER): Payer: PRIVATE HEALTH INSURANCE

## 2019-03-19 ENCOUNTER — Other Ambulatory Visit: Payer: Self-pay

## 2019-03-19 DIAGNOSIS — I2510 Atherosclerotic heart disease of native coronary artery without angina pectoris: Secondary | ICD-10-CM

## 2019-03-19 DIAGNOSIS — E890 Postprocedural hypothyroidism: Secondary | ICD-10-CM

## 2019-03-19 DIAGNOSIS — M2550 Pain in unspecified joint: Secondary | ICD-10-CM | POA: Insufficient documentation

## 2019-03-19 LAB — HEPATIC FUNCTION PANEL
ALT: 13 U/L (ref 0–35)
AST: 16 U/L (ref 0–37)
Albumin: 4.1 g/dL (ref 3.5–5.2)
Alkaline Phosphatase: 53 U/L (ref 39–117)
Bilirubin, Direct: 0.1 mg/dL (ref 0.0–0.3)
Total Bilirubin: 0.6 mg/dL (ref 0.2–1.2)
Total Protein: 7.1 g/dL (ref 6.0–8.3)

## 2019-03-19 LAB — URINALYSIS
Bilirubin Urine: NEGATIVE
Hgb urine dipstick: NEGATIVE
Ketones, ur: NEGATIVE
Leukocytes,Ua: NEGATIVE
Nitrite: NEGATIVE
Specific Gravity, Urine: 1.01 (ref 1.000–1.030)
Total Protein, Urine: NEGATIVE
Urine Glucose: NEGATIVE
Urobilinogen, UA: 0.2 (ref 0.0–1.0)
pH: 6.5 (ref 5.0–8.0)

## 2019-03-19 LAB — URIC ACID: Uric Acid, Serum: 6.5 mg/dL (ref 2.4–7.0)

## 2019-03-19 LAB — CBC WITH DIFFERENTIAL/PLATELET
Basophils Absolute: 0 10*3/uL (ref 0.0–0.1)
Basophils Relative: 0.7 % (ref 0.0–3.0)
Eosinophils Absolute: 0.2 10*3/uL (ref 0.0–0.7)
Eosinophils Relative: 4.5 % (ref 0.0–5.0)
HCT: 40.6 % (ref 36.0–46.0)
Hemoglobin: 13.5 g/dL (ref 12.0–15.0)
Lymphocytes Relative: 43.7 % (ref 12.0–46.0)
Lymphs Abs: 2.4 10*3/uL (ref 0.7–4.0)
MCHC: 33.2 g/dL (ref 30.0–36.0)
MCV: 94.2 fl (ref 78.0–100.0)
Monocytes Absolute: 0.3 10*3/uL (ref 0.1–1.0)
Monocytes Relative: 5.4 % (ref 3.0–12.0)
Neutro Abs: 2.5 10*3/uL (ref 1.4–7.7)
Neutrophils Relative %: 45.7 % (ref 43.0–77.0)
Platelets: 288 10*3/uL (ref 150.0–400.0)
RBC: 4.31 Mil/uL (ref 3.87–5.11)
RDW: 13.8 % (ref 11.5–15.5)
WBC: 5.5 10*3/uL (ref 4.0–10.5)

## 2019-03-19 LAB — BASIC METABOLIC PANEL
BUN: 9 mg/dL (ref 6–23)
CO2: 27 mEq/L (ref 19–32)
Calcium: 9.1 mg/dL (ref 8.4–10.5)
Chloride: 105 mEq/L (ref 96–112)
Creatinine, Ser: 0.78 mg/dL (ref 0.40–1.20)
GFR: 93.5 mL/min (ref >60.00)
Glucose, Bld: 97 mg/dL (ref 70–99)
Potassium: 3.8 mEq/L (ref 3.5–5.1)
Sodium: 140 mEq/L (ref 135–145)

## 2019-03-19 LAB — LIPID PANEL
Cholesterol: 171 mg/dL (ref 0–200)
HDL: 58 mg/dL (ref >39.00)
LDL Cholesterol: 93 mg/dL (ref 0–99)
NonHDL: 113.43
Total CHOL/HDL Ratio: 3
Triglycerides: 100 mg/dL (ref 0.0–149.0)
VLDL: 20 mg/dL (ref 0.0–40.0)

## 2019-03-19 LAB — TSH: TSH: 4.11 u[IU]/mL (ref 0.35–4.50)

## 2019-03-19 LAB — VITAMIN D 25 HYDROXY (VIT D DEFICIENCY, FRACTURES): VITD: 63.7 ng/mL (ref 30.00–100.00)

## 2019-03-19 LAB — SEDIMENTATION RATE: Sed Rate: 21 mm/hr (ref 0–30)

## 2019-03-19 LAB — CK: Total CK: 156 U/L (ref 7–177)

## 2019-03-19 MED ORDER — MELOXICAM 15 MG PO TABS: 15.0000 mg | ORAL_TABLET | Freq: Every day | ORAL | 1 refills | Status: DC

## 2019-03-19 NOTE — Progress Notes (Signed)
Subjective:  Patient ID: [name], female    DOB: 11-05-1965  Age: 52 y.o. MRN: [id number]  CC: No chief complaint on file.   HPI [name] presents for hand swelling and pain - B hands and L foot x 3-4 wks, swollen fingers, stiffness all day Took Advil, cherries F/u CAD, hypothyroidism  Outpatient Medications Prior to Visit  Medication Sig Dispense Refill  . acetaminophen (TYLENOL) 500 MG tablet Take 1,000 mg by mouth every 6 (six) hours as needed for headache (pain).    Marland Kitchen amLODipine (NORVASC) 5 MG tablet TAKE 1 TABLET BY MOUTH DAILY. CALL OFFICE [phone] TO SCHEDULE VISIT. 30 tablet 5  . aspirin EC 81 MG tablet Take 81 mg by mouth daily.    Marland Kitchen atenolol (TENORMIN) 25 MG tablet Take 1 tablet (25 mg total) by mouth daily. -- Office visit needed for further refills 90 tablet 0  . azithromycin (ZITHROMAX Z-PAK) 250 MG tablet As directed 6 each 0  . Cholecalciferol (VITAMIN D) 2000 units tablet Take 2,000 Units by mouth daily.    . cyclobenzaprine (FLEXERIL) 10 MG tablet Take 1 tablet (10 mg total) by mouth 3 (three) times daily as needed for muscle spasms. 30 tablet 0  . levothyroxine (SYNTHROID) 25 MCG tablet Take 1 tablet (25 mcg total) by mouth daily before breakfast. 90 tablet 3  . LIVALO 2 MG TABS TAKE 1 TABLET (2 MG TOTAL) BY MOUTH DAILY. 30 tablet 11  . nitroGLYCERIN (NITROSTAT) 0.4 MG SL tablet Place 1 tablet (0.4 mg total) under the tongue every 5 (five) minutes as needed for chest pain. 25 tablet 1  . Polyethyl Glycol-Propyl Glycol (SYSTANE OP) Place 1 drop into both eyes 5 (five) times daily as needed (dry eyes).     Marland Kitchen telmisartan (MICARDIS) 80 MG tablet Take 1 tablet (80 mg total) by mouth daily. 90 tablet 3   No facility-administered medications prior to visit.     ROS: Review of Systems  Constitutional: Negative for activity change, appetite change, chills, fatigue and unexpected weight change.  HENT: Negative for congestion, mouth sores and sinus  pressure.   Eyes: Negative for visual disturbance.  Respiratory: Negative for cough and chest tightness.   Gastrointestinal: Negative for abdominal pain and nausea.  Genitourinary: Negative for difficulty urinating, frequency and vaginal pain.  Musculoskeletal: Positive for arthralgias. Negative for back pain and gait problem.  Skin: Negative for pallor and rash.  Neurological: Negative for dizziness, tremors, weakness, numbness and headaches.  Psychiatric/Behavioral: Negative for confusion, sleep disturbance and suicidal ideas.    Objective:  There were no vitals taken for this visit.  BP Readings from Last 3 Encounters:  06/08/18 128/84  12/19/17 112/70  12/13/17 124/76    Wt Readings from Last 3 Encounters:  06/08/18 177 lb (80.3 kg)  12/19/17 176 lb (79.8 kg)  12/13/17 176 lb (79.8 kg)    Physical Exam Constitutional:      General: She is not in acute distress.    Appearance: She is well-developed.  HENT:     Head: Normocephalic.     Right Ear: External ear normal.     Left Ear: External ear normal.     Nose: Nose normal.  Eyes:     General:        Right eye: No discharge.        Left eye: No discharge.     Conjunctiva/sclera: Conjunctivae normal.     Pupils: Pupils are equal, round, and reactive to light.  Neck:     Musculoskeletal: Normal range of motion and neck supple.     Thyroid: No thyromegaly.     Vascular: No JVD.     Trachea: No tracheal deviation.  Cardiovascular:     Rate and Rhythm: Normal rate and regular rhythm.     Heart sounds: Normal heart sounds.  Pulmonary:     Effort: No respiratory distress.     Breath sounds: No stridor. No wheezing.  Abdominal:     General: Bowel sounds are normal. There is no distension.     Palpations: Abdomen is soft. There is no mass.     Tenderness: There is no abdominal tenderness. There is no guarding or rebound.  Musculoskeletal:        General: No tenderness.  Lymphadenopathy:     Cervical: No cervical  adenopathy.  Skin:    Findings: No erythema or rash.  Neurological:     Cranial Nerves: No cranial nerve deficit.     Motor: No abnormal muscle tone.     Coordination: Coordination normal.     Deep Tendon Reflexes: Reflexes normal.  Psychiatric:        Behavior: Behavior normal.        Thought Content: Thought content normal.        Judgment: Judgment normal.   hands NT obese  Lab Results  Component Value Date   WBC 7.0 12/01/2017   HGB 13.3 12/01/2017   HCT 39.0 12/01/2017   PLT 272 12/01/2017   GLUCOSE 84 12/01/2017   CHOL 147 04/21/2017   TRIG 71.0 04/21/2017   HDL 57.80 04/21/2017   LDLCALC 75 04/21/2017   ALT 23 12/01/2017   AST 20 12/01/2017   NA 139 12/01/2017   K 3.9 12/01/2017   CL 102 12/01/2017   CREATININE 0.70 12/01/2017   BUN 9 12/01/2017   CO2 24 12/01/2017   TSH 8.73 (H) 12/13/2017   INR 1.15 05/22/2016   HGBA1C 5.6 12/04/2015    Dg Chest 2 View  Result Date: 12/01/2017 CLINICAL DATA:  Chest pain off and on for a week, BILATERAL lower back pain, hypertension, coronary artery disease post stenting EXAM: CHEST - 2 VIEW COMPARISON:  11/24/2017 FINDINGS: Upper normal heart size with note of coronary stent. Mediastinal contours and pulmonary vascularity normal. Lungs clear. No pleural effusion or pneumothorax. Bones unremarkable. IMPRESSION: No acute abnormalities. Electronically Signed   By: Lavonia Dana M.D.   On: 12/01/2017 14:05    Assessment & Plan:   There are no diagnoses linked to this encounter.   No orders of the defined types were placed in this encounter.    Follow-up: No follow-ups on file.  Walker Kehr, MD

## 2019-03-19 NOTE — Assessment & Plan Note (Signed)
TSH 

## 2019-03-19 NOTE — Assessment & Plan Note (Addendum)
B hands, L foot x 4 wks ?etiol - Livalo related vs other. M w/RA Labs, incl CK, Vit D, RF Meloxicam

## 2019-03-19 NOTE — Assessment & Plan Note (Signed)
No angina 

## 2019-03-20 LAB — RHEUMATOID FACTOR: Rheumatoid fact SerPl-aCnc: <14 IU/mL (ref <14)

## 2019-12-05 ENCOUNTER — Encounter: Payer: Self-pay | Admitting: Internal Medicine

## 2019-12-05 ENCOUNTER — Other Ambulatory Visit: Payer: Self-pay

## 2019-12-05 ENCOUNTER — Ambulatory Visit: Payer: PRIVATE HEALTH INSURANCE | Admitting: Internal Medicine

## 2019-12-05 VITALS — BP 168/104 | HR 70 | Temp 98.5°F | Ht 62.0 in | Wt 190.0 lb

## 2019-12-05 DIAGNOSIS — R112 Nausea with vomiting, unspecified: Secondary | ICD-10-CM | POA: Diagnosis not present

## 2019-12-05 DIAGNOSIS — E890 Postprocedural hypothyroidism: Secondary | ICD-10-CM

## 2019-12-05 DIAGNOSIS — I10 Essential (primary) hypertension: Secondary | ICD-10-CM

## 2019-12-05 DIAGNOSIS — I2510 Atherosclerotic heart disease of native coronary artery without angina pectoris: Secondary | ICD-10-CM

## 2019-12-05 DIAGNOSIS — R51 Headache with orthostatic component, not elsewhere classified: Secondary | ICD-10-CM

## 2019-12-05 DIAGNOSIS — R519 Headache, unspecified: Secondary | ICD-10-CM | POA: Insufficient documentation

## 2019-12-05 MED ORDER — BUTALBITAL-APAP-CAFFEINE 50-325-40 MG PO TABS: 1.0000 | ORAL_TABLET | Freq: Two times a day (BID) | ORAL | 0 refills | Status: AC | PRN

## 2019-12-05 NOTE — Assessment & Plan Note (Signed)
New, complicated Brain MRI To ER if worse Fioricet prn

## 2019-12-05 NOTE — Assessment & Plan Note (Signed)
Discuss Repatha w/Dr Minna Merritts

## 2019-12-05 NOTE — Patient Instructions (Addendum)
FEMA mass vaccine site in [place]:  Call the [place] at [phone] to schedule your shot at [facility].   Sudie Grumbling. - [place]'s federal vaccine clinic is preparing to open on Wednesday 12/04/19. The FEMA site at [place] has the capacity to vaccinate 3,000 people a day for eight weeks. That's nearly 170,000 doses just from this one clinic.   With such a big operation, here are answers to some questions you may have about the drive-thru and indoor site:  How do I make an appointment?   Head to [email or web address] to schedule your appointment indoors or in the drive-thru, or call the [place] at [phone].  What if I need to change or cancel my appointment, or have more questions?   Call the [place] at [phone].  What vaccines will be available at the clinic?   The vaccine clinic will begin giving both Pfizer and Moderna two-dose COVID-19 vaccines. The single-dose [employer] vaccines will be given during the last two weeks of the clinic.   What part of the [place] do I enter for my appointment?  Enter from [employer] and turn onto [address]. A clinic staff member will confirm your appointment for the day. You'll then either be directed to registration or to a waiting area until your appointment time.  Can I be seen sooner if I'm early?  Those who are early will park in a designated waiting area until their vaccine appointment time approaches.  How does registration work?  There are five open lanes for registration. Someone will get the necessary information needed to confirm appointments and other details to keep a record of who's getting the vaccine every day. Temperatures will be checked to make sure you're in good shape to get the vaccine.  How long will it take to get my shot?  [place] park your car in a long tent with about 10 other  vehicles. All 10 people in that group will get a shot inside their vehicles. Getting the actual shot only takes a few minutes. The entire process takes about 30 minutes.  How does the observation period work?  All patients will wait in the same tent they got their vaccine at for 15 minutes.      Repatha

## 2019-12-05 NOTE — Progress Notes (Signed)
Subjective:  Patient ID: [name], female    DOB: [birth year]  Age: 54 y.o. MRN: [id number]  CC: No chief complaint on file.   HPI [name] presents for severe HAs, vertigo and vomiting x 2 days in Jan 2021. HA never went away - only when laying down. C/o blurred vision... F/u HTN, hypothyroidism, CAD  Outpatient Medications Prior to Visit  Medication Sig Dispense Refill  . acetaminophen (TYLENOL) 500 MG tablet Take 1,000 mg by mouth every 6 (six) hours as needed for headache (pain).    Marland Kitchen amLODipine (NORVASC) 5 MG tablet TAKE 1 TABLET BY MOUTH DAILY. CALL OFFICE [phone] TO SCHEDULE VISIT. 30 tablet 5  . aspirin EC 81 MG tablet Take 81 mg by mouth daily.    Marland Kitchen atenolol (TENORMIN) 25 MG tablet Take 1 tablet (25 mg total) by mouth daily. -- Office visit needed for further refills 90 tablet 0  . Cholecalciferol (VITAMIN D) 2000 units tablet Take 2,000 Units by mouth daily.    . cyclobenzaprine (FLEXERIL) 10 MG tablet Take 1 tablet (10 mg total) by mouth 3 (three) times daily as needed for muscle spasms. 30 tablet 0  . levothyroxine (SYNTHROID) 25 MCG tablet Take 1 tablet (25 mcg total) by mouth daily before breakfast. 90 tablet 3  . LIVALO 2 MG TABS TAKE 1 TABLET (2 MG TOTAL) BY MOUTH DAILY. 30 tablet 11  . meloxicam (MOBIC) 15 MG tablet Take 1 tablet (15 mg total) by mouth daily. 30 tablet 1  . nitroGLYCERIN (NITROSTAT) 0.4 MG SL tablet Place 1 tablet (0.4 mg total) under the tongue every 5 (five) minutes as needed for chest pain. 25 tablet 1  . Polyethyl Glycol-Propyl Glycol (SYSTANE OP) Place 1 drop into both eyes 5 (five) times daily as needed (dry eyes).     Marland Kitchen telmisartan (MICARDIS) 80 MG tablet Take 1 tablet (80 mg total) by mouth daily. 90 tablet 3  . azithromycin (ZITHROMAX Z-PAK) 250 MG tablet As directed 6 each 0   No facility-administered medications prior to visit.    ROS: Review of Systems  Constitutional: Negative for activity change, appetite change,  chills, diaphoresis, fatigue, fever and unexpected weight change.  HENT: Negative for congestion, dental problem, ear pain, hearing loss, mouth sores, postnasal drip, sinus pressure, sneezing, sore throat and voice change.   Eyes: Positive for visual disturbance. Negative for pain.  Respiratory: Negative for cough, chest tightness, wheezing and stridor.   Cardiovascular: Negative for chest pain, palpitations and leg swelling.  Gastrointestinal: Negative for abdominal distention, abdominal pain, blood in stool, nausea, rectal pain and vomiting.  Genitourinary: Negative for decreased urine volume, difficulty urinating, dysuria, frequency, hematuria, menstrual problem, vaginal bleeding, vaginal discharge and vaginal pain.  Musculoskeletal: Positive for arthralgias. Negative for back pain, gait problem, joint swelling, neck pain and neck stiffness.  Skin: Negative for color change, rash and wound.  Neurological: Positive for dizziness and headaches. Negative for tremors, syncope, facial asymmetry, speech difficulty, weakness and light-headedness.  Hematological: Negative for adenopathy.  Psychiatric/Behavioral: Negative for behavioral problems, confusion, decreased concentration, dysphoric mood, hallucinations, sleep disturbance and suicidal ideas. The patient is not nervous/anxious and is not hyperactive.     Objective:  BP (!) 168/104 (BP Location: Left Arm, Patient Position: Sitting, Cuff Size: Large)   Pulse 70   Temp 98.5 F (36.9 C) (Oral)   Ht 5\' 2"  (1.575 m)   Wt 190 lb (86.2 kg)   SpO2 98%   BMI 34.75 kg/m  BP Readings from Last 3 Encounters:  12/05/19 (!) 168/104  03/19/19 128/82  06/08/18 128/84    Wt Readings from Last 3 Encounters:  12/05/19 190 lb (86.2 kg)  03/19/19 188 lb (85.3 kg)  06/08/18 177 lb (80.3 kg)    Physical Exam Constitutional:      General: She is not in acute distress.    Appearance: She is well-developed. She is obese.  HENT:     Head:  Normocephalic.     Right Ear: External ear normal.     Left Ear: External ear normal.     Nose: Nose normal.  Eyes:     General:        Right eye: No discharge.        Left eye: No discharge.     Conjunctiva/sclera: Conjunctivae normal.     Pupils: Pupils are equal, round, and reactive to light.  Neck:     Thyroid: No thyromegaly.     Vascular: No JVD.     Trachea: No tracheal deviation.  Cardiovascular:     Rate and Rhythm: Normal rate and regular rhythm.     Heart sounds: Normal heart sounds.  Pulmonary:     Effort: No respiratory distress.     Breath sounds: No stridor. No wheezing.  Abdominal:     General: Bowel sounds are normal. There is no distension.     Palpations: Abdomen is soft. There is no mass.     Tenderness: There is no abdominal tenderness. There is no guarding or rebound.  Musculoskeletal:        General: No tenderness.     Cervical back: Normal range of motion and neck supple.  Lymphadenopathy:     Cervical: No cervical adenopathy.  Skin:    Findings: No erythema or rash.  Neurological:     General: No focal deficit present.     Mental Status: She is oriented to person, place, and time.     Cranial Nerves: No cranial nerve deficit.     Sensory: No sensory deficit.     Motor: No weakness or abnormal muscle tone.     Coordination: Coordination normal.     Gait: Gait normal.     Deep Tendon Reflexes: Reflexes normal.  Psychiatric:        Behavior: Behavior normal.        Thought Content: Thought content normal.        Judgment: Judgment normal.     Lab Results  Component Value Date   WBC 5.5 03/19/2019   HGB 13.5 03/19/2019   HCT 40.6 03/19/2019   PLT 288.0 03/19/2019   GLUCOSE 97 03/19/2019   CHOL 171 03/19/2019   TRIG 100.0 03/19/2019   HDL 58.00 03/19/2019   LDLCALC 93 03/19/2019   ALT 13 03/19/2019   AST 16 03/19/2019   NA 140 03/19/2019   K 3.8 03/19/2019   CL 105 03/19/2019   CREATININE 0.78 03/19/2019   BUN 9 03/19/2019   CO2 27  03/19/2019   TSH 4.11 03/19/2019   INR 1.15 05/22/2016   HGBA1C 5.6 12/04/2015    DG Chest 2 View  Result Date: 12/01/2017 CLINICAL DATA:  Chest pain off and on for a week, BILATERAL lower back pain, hypertension, coronary artery disease post stenting EXAM: CHEST - 2 VIEW COMPARISON:  11/24/2017 FINDINGS: Upper normal heart size with note of coronary stent. Mediastinal contours and pulmonary vascularity normal. Lungs clear. No pleural effusion or pneumothorax. Bones unremarkable. IMPRESSION: No acute abnormalities.  Electronically Signed   By: Lavonia Dana M.D.   On: 12/01/2017 14:05    Assessment & Plan:   There are no diagnoses linked to this encounter.   No orders of the defined types were placed in this encounter.    Follow-up: Return for a follow-up visit.  Walker Kehr, MD

## 2019-12-05 NOTE — Assessment & Plan Note (Signed)
BP Readings from Last 3 Encounters:  12/05/19 (!) 168/104  03/19/19 128/82  06/08/18 128/84  RTC 2 wks

## 2019-12-05 NOTE — Assessment & Plan Note (Signed)
Last TSH ok

## 2019-12-24 ENCOUNTER — Other Ambulatory Visit: Payer: Self-pay

## 2019-12-24 ENCOUNTER — Emergency Department (HOSPITAL_COMMUNITY): Payer: PRIVATE HEALTH INSURANCE

## 2019-12-24 ENCOUNTER — Emergency Department (HOSPITAL_COMMUNITY)
Admission: EM | Admit: 2019-12-24 | Discharge: 2019-12-24 | Disposition: A | Discharge status: Home / Self Care | Payer: PRIVATE HEALTH INSURANCE | Attending: Emergency Medicine | Admitting: Emergency Medicine

## 2019-12-24 ENCOUNTER — Encounter (HOSPITAL_COMMUNITY): Payer: Self-pay | Admitting: Emergency Medicine

## 2019-12-24 DIAGNOSIS — R110 Nausea: Secondary | ICD-10-CM | POA: Diagnosis not present

## 2019-12-24 DIAGNOSIS — M25512 Pain in left shoulder: Secondary | ICD-10-CM | POA: Insufficient documentation

## 2019-12-24 DIAGNOSIS — Z79899 Other long term (current) drug therapy: Secondary | ICD-10-CM | POA: Diagnosis not present

## 2019-12-24 DIAGNOSIS — E039 Hypothyroidism, unspecified: Secondary | ICD-10-CM | POA: Insufficient documentation

## 2019-12-24 DIAGNOSIS — R519 Headache, unspecified: Secondary | ICD-10-CM | POA: Diagnosis not present

## 2019-12-24 DIAGNOSIS — R10812 Left upper quadrant abdominal tenderness: Secondary | ICD-10-CM | POA: Diagnosis not present

## 2019-12-24 DIAGNOSIS — M25552 Pain in left hip: Secondary | ICD-10-CM | POA: Diagnosis not present

## 2019-12-24 DIAGNOSIS — R42 Dizziness and giddiness: Secondary | ICD-10-CM | POA: Diagnosis not present

## 2019-12-24 DIAGNOSIS — I10 Essential (primary) hypertension: Secondary | ICD-10-CM | POA: Insufficient documentation

## 2019-12-24 DIAGNOSIS — R0789 Other chest pain: Secondary | ICD-10-CM | POA: Insufficient documentation

## 2019-12-24 DIAGNOSIS — M25532 Pain in left wrist: Secondary | ICD-10-CM | POA: Diagnosis not present

## 2019-12-24 DIAGNOSIS — I2510 Atherosclerotic heart disease of native coronary artery without angina pectoris: Secondary | ICD-10-CM | POA: Insufficient documentation

## 2019-12-24 LAB — CBC WITH DIFFERENTIAL/PLATELET
Abs Immature Granulocytes: 0.02 10*3/uL (ref 0.00–0.07)
Basophils Absolute: 0 10*3/uL (ref 0.0–0.1)
Basophils Relative: 1 %
Eosinophils Absolute: 0.2 10*3/uL (ref 0.0–0.5)
Eosinophils Relative: 3 %
HCT: 44.1 % (ref 36.0–46.0)
Hemoglobin: 14.4 g/dL (ref 12.0–15.0)
Immature Granulocytes: 0 %
Lymphocytes Relative: 37 %
Lymphs Abs: 2.4 10*3/uL (ref 0.7–4.0)
MCH: 30.5 pg (ref 26.0–34.0)
MCHC: 32.7 g/dL (ref 30.0–36.0)
MCV: 93.4 fL (ref 80.0–100.0)
Monocytes Absolute: 0.5 10*3/uL (ref 0.1–1.0)
Monocytes Relative: 7 %
Neutro Abs: 3.4 10*3/uL (ref 1.7–7.7)
Neutrophils Relative %: 52 %
Platelets: 294 10*3/uL (ref 150–400)
RBC: 4.72 MIL/uL (ref 3.87–5.11)
RDW: 13.4 % (ref 11.5–15.5)
WBC: 6.5 10*3/uL (ref 4.0–10.5)
nRBC: 0 % (ref 0.0–0.2)

## 2019-12-24 LAB — BASIC METABOLIC PANEL
Anion gap: 12 (ref 5–15)
BUN: 11 mg/dL (ref 6–20)
CO2: 25 mmol/L (ref 22–32)
Calcium: 9.1 mg/dL (ref 8.9–10.3)
Chloride: 102 mmol/L (ref 98–111)
Creatinine, Ser: 0.85 mg/dL (ref 0.44–1.00)
GFR calc Af Amer: >60 mL/min (ref >60)
GFR calc non Af Amer: >60 mL/min (ref >60)
Glucose, Bld: 85 mg/dL (ref 70–99)
Potassium: 3.4 mmol/L — ABNORMAL LOW (ref 3.5–5.1)
Sodium: 139 mmol/L (ref 135–145)

## 2019-12-24 MED ORDER — ONDANSETRON HCL 4 MG/2ML IJ SOLN
4.0000 mg | Freq: Once | INTRAMUSCULAR | Status: AC
  Administered 2019-12-24: 4 mg via INTRAVENOUS
  Filled 2019-12-24: qty 2

## 2019-12-24 MED ORDER — METHOCARBAMOL 500 MG PO TABS: 500.0000 mg | ORAL_TABLET | Freq: Two times a day (BID) | ORAL | 0 refills | Status: DC

## 2019-12-24 MED ORDER — MORPHINE SULFATE (PF) 4 MG/ML IV SOLN
4.0000 mg | Freq: Once | INTRAVENOUS | Status: AC
  Administered 2019-12-24: 4 mg via INTRAVENOUS
  Filled 2019-12-24: qty 1

## 2019-12-24 MED ORDER — IOHEXOL 300 MG/ML  SOLN
100.0000 mL | Freq: Once | INTRAMUSCULAR | Status: AC | PRN
  Administered 2019-12-24: 100 mL via INTRAVENOUS

## 2019-12-24 MED ORDER — NAPROXEN 500 MG PO TABS: 500.0000 mg | ORAL_TABLET | Freq: Two times a day (BID) | ORAL | 0 refills | Status: DC

## 2019-12-24 NOTE — ED Notes (Signed)
Pt transported to CT ?

## 2019-12-24 NOTE — ED Notes (Signed)
Pt transported to Xray. 
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
## 2019-12-24 NOTE — Discharge Instructions (Signed)
Imaging today was reassuring-- no broken bones or internal injuries. Medications sent to your pharmacy already--- take as directed.  Can try using heat/ice on sore areas as well. Follow-up with your primary care doctor. Return here for any new/acute changes.
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
## 2019-12-24 NOTE — ED Notes (Signed)
Pt verbalizes understanding of d/c instructions. Prescriptions reviewed with patient. Pt ambulatory at d/c with all belongings and with family.   
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
## 2020-01-06 ENCOUNTER — Other Ambulatory Visit: Payer: Self-pay | Admitting: Internal Medicine

## 2020-01-12 ENCOUNTER — Other Ambulatory Visit: Payer: Self-pay

## 2020-01-12 ENCOUNTER — Ambulatory Visit
Admission: RE | Admit: 2020-01-12 | Discharge: 2020-01-12 | Disposition: A | Discharge status: Home / Self Care | Payer: PRIVATE HEALTH INSURANCE | Source: Ambulatory Visit | Attending: Internal Medicine | Admitting: Internal Medicine

## 2020-01-12 DIAGNOSIS — R51 Headache with orthostatic component, not elsewhere classified: Secondary | ICD-10-CM

## 2020-01-12 DIAGNOSIS — R112 Nausea with vomiting, unspecified: Secondary | ICD-10-CM

## 2020-01-12 MED ORDER — GADOBENATE DIMEGLUMINE 529 MG/ML IV SOLN
20.0000 mL | Freq: Once | INTRAVENOUS | Status: AC | PRN
  Administered 2020-01-12: 18 mL via INTRAVENOUS

## 2020-01-27 DIAGNOSIS — S93401A Sprain of unspecified ligament of right ankle, initial encounter: Secondary | ICD-10-CM | POA: Insufficient documentation

## 2020-01-27 DIAGNOSIS — M19072 Primary osteoarthritis, left ankle and foot: Secondary | ICD-10-CM | POA: Insufficient documentation

## 2020-02-05 DIAGNOSIS — M659 Synovitis and tenosynovitis, unspecified: Secondary | ICD-10-CM | POA: Insufficient documentation

## 2020-02-05 DIAGNOSIS — M65972 Unspecified synovitis and tenosynovitis, left ankle and foot: Secondary | ICD-10-CM | POA: Insufficient documentation

## 2020-07-11 ENCOUNTER — Encounter (HOSPITAL_COMMUNITY): Payer: Self-pay | Admitting: Emergency Medicine

## 2020-07-11 ENCOUNTER — Emergency Department (HOSPITAL_COMMUNITY): Payer: PRIVATE HEALTH INSURANCE

## 2020-07-11 ENCOUNTER — Observation Stay (HOSPITAL_COMMUNITY): Payer: PRIVATE HEALTH INSURANCE

## 2020-07-11 ENCOUNTER — Other Ambulatory Visit: Payer: Self-pay

## 2020-07-11 ENCOUNTER — Observation Stay (HOSPITAL_COMMUNITY)
Admission: EM | Admit: 2020-07-11 | Discharge: 2020-07-11 | Disposition: A | Discharge status: Home / Self Care | Payer: PRIVATE HEALTH INSURANCE | Attending: Cardiology | Admitting: Cardiology

## 2020-07-11 DIAGNOSIS — I10 Essential (primary) hypertension: Secondary | ICD-10-CM | POA: Insufficient documentation

## 2020-07-11 DIAGNOSIS — Z20822 Contact with and (suspected) exposure to covid-19: Secondary | ICD-10-CM | POA: Diagnosis not present

## 2020-07-11 DIAGNOSIS — I200 Unstable angina: Secondary | ICD-10-CM | POA: Diagnosis present

## 2020-07-11 DIAGNOSIS — E039 Hypothyroidism, unspecified: Secondary | ICD-10-CM | POA: Insufficient documentation

## 2020-07-11 DIAGNOSIS — R0789 Other chest pain: Secondary | ICD-10-CM | POA: Diagnosis present

## 2020-07-11 DIAGNOSIS — I25110 Atherosclerotic heart disease of native coronary artery with unstable angina pectoris: Secondary | ICD-10-CM | POA: Diagnosis not present

## 2020-07-11 DIAGNOSIS — Z955 Presence of coronary angioplasty implant and graft: Secondary | ICD-10-CM | POA: Diagnosis not present

## 2020-07-11 DIAGNOSIS — Z79899 Other long term (current) drug therapy: Secondary | ICD-10-CM | POA: Diagnosis not present

## 2020-07-11 DIAGNOSIS — I493 Ventricular premature depolarization: Secondary | ICD-10-CM | POA: Diagnosis not present

## 2020-07-11 LAB — CBC
HCT: 42.9 % (ref 36.0–46.0)
Hemoglobin: 13.5 g/dL (ref 12.0–15.0)
MCH: 29.6 pg (ref 26.0–34.0)
MCHC: 31.5 g/dL (ref 30.0–36.0)
MCV: 94.1 fL (ref 80.0–100.0)
Platelets: 264 10*3/uL (ref 150–400)
RBC: 4.56 MIL/uL (ref 3.87–5.11)
RDW: 13.4 % (ref 11.5–15.5)
WBC: 5.7 10*3/uL (ref 4.0–10.5)
nRBC: 0 % (ref 0.0–0.2)

## 2020-07-11 LAB — RESPIRATORY PANEL BY RT PCR (FLU A&B, COVID)
Influenza A by PCR: NEGATIVE
Influenza B by PCR: NEGATIVE
SARS Coronavirus 2 by RT PCR: NEGATIVE

## 2020-07-11 LAB — TROPONIN I (HIGH SENSITIVITY)
Troponin I (High Sensitivity): 4 ng/L (ref <18)
Troponin I (High Sensitivity): 3 ng/L (ref <18)

## 2020-07-11 LAB — BASIC METABOLIC PANEL
Anion gap: 9 (ref 5–15)
BUN: 7 mg/dL (ref 6–20)
CO2: 29 mmol/L (ref 22–32)
Calcium: 9.7 mg/dL (ref 8.9–10.3)
Chloride: 104 mmol/L (ref 98–111)
Creatinine, Ser: 0.94 mg/dL (ref 0.44–1.00)
GFR, Estimated: >60 mL/min (ref >60)
Glucose, Bld: 106 mg/dL — ABNORMAL HIGH (ref 70–99)
Potassium: 4.2 mmol/L (ref 3.5–5.1)
Sodium: 142 mmol/L (ref 135–145)

## 2020-07-11 LAB — I-STAT BETA HCG BLOOD, ED (MC, WL, AP ONLY): I-stat hCG, quantitative: <5 m[IU]/mL (ref <5)

## 2020-07-11 LAB — PROTIME-INR
INR: 1 (ref 0.8–1.2)
Prothrombin Time: 12.4 seconds (ref 11.4–15.2)

## 2020-07-11 MED ORDER — IRBESARTAN 75 MG PO TABS
75.0000 mg | ORAL_TABLET | Freq: Every day | ORAL | Status: DC
  Administered 2020-07-11: 75 mg via ORAL
  Filled 2020-07-11: qty 1

## 2020-07-11 MED ORDER — HEPARIN (PORCINE) 25000 UT/250ML-% IV SOLN
850.0000 [IU]/h | INTRAVENOUS | Status: DC
  Administered 2020-07-11: 850 [IU]/h via INTRAVENOUS
  Filled 2020-07-11: qty 250

## 2020-07-11 MED ORDER — PRAVASTATIN SODIUM 10 MG PO TABS: 20.0000 mg | ORAL_TABLET | Freq: Every day | ORAL | Status: DC

## 2020-07-11 MED ORDER — HEPARIN BOLUS VIA INFUSION
4000.0000 [IU] | Freq: Once | INTRAVENOUS | Status: AC
  Administered 2020-07-11: 4000 [IU] via INTRAVENOUS
  Filled 2020-07-11: qty 4000

## 2020-07-11 MED ORDER — ISOSORBIDE MONONITRATE ER 60 MG PO TB24
60.0000 mg | ORAL_TABLET | Freq: Every day | ORAL | 2 refills | Status: DC
Start: 2020-07-11 — End: 2021-01-29

## 2020-07-11 MED ORDER — ISOSORBIDE MONONITRATE ER 30 MG PO TB24: 60.0000 mg | ORAL_TABLET | Freq: Every day | ORAL | Status: DC

## 2020-07-11 MED ORDER — METHOCARBAMOL 500 MG PO TABS
500.0000 mg | ORAL_TABLET | Freq: Two times a day (BID) | ORAL | Status: DC
  Administered 2020-07-11: 500 mg via ORAL
  Filled 2020-07-11: qty 1

## 2020-07-11 MED ORDER — LEVOTHYROXINE SODIUM 25 MCG PO TABS
25.0000 ug | ORAL_TABLET | Freq: Every day | ORAL | Status: DC
  Administered 2020-07-11: 25 ug via ORAL
  Filled 2020-07-11: qty 1

## 2020-07-11 MED ORDER — ASPIRIN 81 MG PO CHEW
162.0000 mg | CHEWABLE_TABLET | Freq: Once | ORAL | Status: AC
  Administered 2020-07-11: 162 mg via ORAL
  Filled 2020-07-11: qty 2

## 2020-07-11 MED ORDER — NITROGLYCERIN IN D5W 200-5 MCG/ML-% IV SOLN
2.0000 ug/min | INTRAVENOUS | Status: DC
  Administered 2020-07-11: 5 ug/min via INTRAVENOUS
  Filled 2020-07-11: qty 250

## 2020-07-11 MED ORDER — AMLODIPINE BESYLATE 5 MG PO TABS
5.0000 mg | ORAL_TABLET | Freq: Every day | ORAL | Status: DC
  Administered 2020-07-11: 5 mg via ORAL
  Filled 2020-07-11: qty 1

## 2020-07-11 MED ORDER — REGADENOSON 0.4 MG/5ML IV SOLN
INTRAVENOUS | Status: AC
  Administered 2020-07-11: 0.4 mg via INTRAVENOUS
  Filled 2020-07-11: qty 5

## 2020-07-11 MED ORDER — NITROGLYCERIN 0.4 MG SL SUBL
0.4000 mg | SUBLINGUAL_TABLET | SUBLINGUAL | Status: AC | PRN
  Administered 2020-07-11 (×3): 0.4 mg via SUBLINGUAL
  Filled 2020-07-11: qty 1

## 2020-07-11 MED ORDER — TECHNETIUM TC 99M TETROFOSMIN IV KIT
10.5500 | PACK | Freq: Once | INTRAVENOUS | Status: AC | PRN
  Administered 2020-07-11: 10.55 via INTRAVENOUS

## 2020-07-11 MED ORDER — REGADENOSON 0.4 MG/5ML IV SOLN
0.4000 mg | Freq: Once | INTRAVENOUS | Status: AC
  Filled 2020-07-11: qty 5

## 2020-07-11 MED ORDER — ATENOLOL 50 MG PO TABS
50.0000 mg | ORAL_TABLET | Freq: Every day | ORAL | Status: DC
  Administered 2020-07-11: 50 mg via ORAL
  Filled 2020-07-11: qty 1

## 2020-07-11 MED ORDER — TECHNETIUM TC 99M TETROFOSMIN IV KIT
32.7000 | PACK | Freq: Once | INTRAVENOUS | Status: AC | PRN
  Administered 2020-07-11: 32.7 via INTRAVENOUS

## 2020-07-11 NOTE — ED Notes (Signed)
Pt states that around 2330 last night she started to feel a tightness in her chest and left arm numbness. Reported that she was out of nitroglycerin and spouse advised her to come to the ER.

## 2020-07-11 NOTE — Progress Notes (Signed)
ACS ruled out. Low suspicion for PE/dissection. Stress test negative for ischemia. Suspect chest pain to be due hypertensive urgency or musculoskeletal in etiology. Added Imdur 60 mg. Okay to discharge. Follow up with primary cardiologist Dr. Minna Merritts in 1-2 weeks.    Nigel Mormon, MD Pager: [phone] Office: [phone]

## 2020-07-11 NOTE — ED Notes (Signed)
Patient transported to [facility].

## 2020-07-11 NOTE — ED Provider Notes (Signed)
[facility] Provider Note   CSN: [id number] Arrival date & time: 07/11/20  0507     History Chief Complaint  Patient presents with  . Chest Pain    CAD/Coronary Stent    [name] is a 54 y.o. female.   Chest Pain Associated symptoms: nausea and shortness of breath   Associated symptoms: no fever and no weakness     HPI: A 54 year old patient with a history of hypertension and hypercholesterolemia presents for evaluation of chest pain. Initial onset of pain was approximately 3-6 hours ago. The patient's chest pain is described as heaviness/pressure/tightness and is not worse with exertion. The patient complains of nausea. The patient's chest pain is middle- or left-sided, is not well-localized, is not sharp and does radiate to the arms/jaw/neck. The patient denies diaphoresis. The patient has a family history of coronary artery disease in a first-degree relative with onset less than age 15. The patient has no history of stroke, has no history of peripheral artery disease, has not smoked in the past 90 days, denies any history of treated diabetes and does not have an elevated BMI (>=30).  Patient with h/o CAD/HTN/Hyperlipidemia Patient reports onset of CP around 2300 last night It started at rest She reports it feels like pressure and sharp pain in her chest and also pressure in her neck.  It is somewhat worse with deep breaths She recalls this is similar to prior episodes of angina No recent CP She does report recent orthopnea for past weeks She is med compliant No recent travel/surgery No h/o VTE  She was going to take NTG at home but ran out of it She only took 2 ASA prior to arrival Past Medical History:  Diagnosis Date  . Acute pancreatitis 2011  . Allergy   . Anemia   . CAD (coronary artery disease) 2009   Dr Olegario Shearer -[facility], Heart Cath  . Family history of adverse reaction to anesthesia    "mother and brother have  difficulty waking up out from it"  . GERD (gastroesophageal reflux disease)   . Hyperlipidemia   . Hypertension   . Hypothyroidism    "recently dx'd" (12/07/2015)  . PVC (premature ventricular contraction)   . Statin intolerance   . STD (sexually transmitted disease)    CHL 20 yrs ago  . Thyroid nodule 2010   "precancerous; thyroid treated with radiation"  . Tubular adenoma of colon 06/2015    Patient Active Problem List   Diagnosis Date Noted  . Headache 12/05/2019  . Arthralgia 03/19/2019  . Right-sided chest wall pain 01/11/2019  . Upper respiratory infection 06/08/2018  . Hypothyroidism 12/13/2017  . Palpitations 12/28/2015  . CAD (coronary artery disease), native coronary artery 12/06/2015  . Non-cardiac chest pain 12/04/2015  . Epigastric abdominal pain 12/04/2015  . Bladder pain 05/14/2015  . Incomplete bladder emptying 05/14/2015  . Occipital neuralgia of left side 11/14/2013  . Aspiration into respiratory tract 04/03/2012  . Cough due to bronchospasm 04/03/2012  . Chest pain, atypical 04/03/2012  . HYPERGLYCEMIA 06/16/2010  . BACK PAIN, LUMBAR 05/27/2009  . THYROID NODULE, RIGHT 05/04/2009  . Hyperlipidemia with target LDL less than 70 12/31/2008  . H/O acute pancreatitis 12/31/2008  . LEG PAIN, BILATERAL 12/31/2008  . PARESTHESIA 12/31/2008  . EDEMA 12/31/2008  . RLQ abdominal pain 10/02/2007  . GERD 09/17/2007  . DYSPNEA 08/28/2007  . Dysphagia 08/28/2007  . Essential hypertension 04/24/2007    Past Surgical History:  Procedure Laterality  Date  . CARDIAC CATHETERIZATION     unable to do cardiac cath but does stress echo 11/22/11  . CARDIAC CATHETERIZATION N/A 12/04/2015   Procedure: Left Heart Cath and Coronary Angiography;  Surgeon: Adrian Prows, MD;  Location: [facility];  Service: Cardiovascular;  Laterality: N/A;  . CARDIAC CATHETERIZATION N/A 12/04/2015   Procedure: Intravascular Pressure Wire/FFR Study;  Surgeon: Adrian Prows, MD;  Location: [facility];  Service: Cardiovascular;  Laterality: N/A;  . CARDIAC CATHETERIZATION N/A 12/07/2015   Procedure: Coronary Stent Intervention;  Surgeon: Adrian Prows, MD;  Location: [facility];  Service: Cardiovascular;  Laterality: N/A;  . CORONARY STENT PLACEMENT    . EYE MUSCLE SURGERY Left 1969  . LAPAROSCOPIC CHOLECYSTECTOMY  2008  . LEFT HEART CATH AND CORONARY ANGIOGRAPHY N/A 11/24/2017   Procedure: LEFT HEART CATH AND CORONARY ANGIOGRAPHY;  Surgeon: Nigel Mormon, MD;  Location: [facility];  Service: Cardiovascular;  Laterality: N/A;  . TUBAL LIGATION Bilateral 1995  . UPPER GASTROINTESTINAL ENDOSCOPY    . VENTRICULAR ABLATION SURGERY  2006,2010   times 2, follow-up for v-tack     OB History    Gravida  4   Para  4   Term  4   Preterm      AB      Living  4     SAB      TAB      Ectopic      Multiple      Live Births  4           Family History  Problem Relation Age of Onset  . Hypertension Father   . Prostate cancer Father   . Heart attack Father   . Colon cancer Father 47  . Hypertension Mother   . Diabetes Mother   . Crohn's disease Daughter   . Heart attack Cousin   . Diabetes Maternal Grandmother   . Cancer Sister        ovarian  . Diabetes Brother   . Esophageal cancer Neg Hx   . Rectal cancer Neg Hx   . Stomach cancer Neg Hx     Social History   Tobacco Use  . Smoking status: Never Smoker  . Smokeless tobacco: Never Used  Substance Use Topics  . Alcohol use: No  . Drug use: No    Home Medications Prior to Admission medications   Medication Sig Start Date End Date Taking? Authorizing Provider  amLODipine (NORVASC) 5 MG tablet Take 1 tablet (5 mg total) by mouth daily. Annual appt due in July must see provider for future refills 01/07/20   Plotnikov, Evie Lacks, MD  atenolol (TENORMIN) 25 MG tablet Take 1 tablet (25 mg total) by mouth daily. -- Office visit needed for further refills 09/05/18   Plotnikov,  Evie Lacks, MD  butalbital-acetaminophen-caffeine (FIORICET) [phone] MG tablet Take 1-2 tablets by mouth 2 (two) times daily as needed for headache. 12/05/19 12/04/20  Plotnikov, Evie Lacks, MD  Cholecalciferol (VITAMIN D) 2000 units tablet Take 2,000 Units by mouth daily.    [provider]  cyclobenzaprine (FLEXERIL) 10 MG tablet Take 1 tablet (10 mg total) by mouth 3 (three) times daily as needed for muscle spasms. Patient not taking: Reported on 12/24/2019 01/11/19   Hoyt Koch, MD  levothyroxine (SYNTHROID) 25 MCG tablet Take 1 tablet (25 mcg total) by mouth daily before breakfast. 12/19/17   Renato Shin, MD  LIVALO 2 MG TABS  TAKE 1 TABLET (2 MG TOTAL) BY MOUTH DAILY. Patient not taking: No sig reported 05/18/18   Plotnikov, Evie Lacks, MD  meloxicam (MOBIC) 15 MG tablet Take 1 tablet (15 mg total) by mouth daily. Patient not taking: Reported on 12/24/2019 03/19/19   Plotnikov, Evie Lacks, MD  methocarbamol (ROBAXIN) 500 MG tablet Take 1 tablet (500 mg total) by mouth 2 (two) times daily. 12/24/19   Larene Pickett, PA-C  naproxen (NAPROSYN) 500 MG tablet Take 1 tablet (500 mg total) by mouth 2 (two) times daily with a meal. 12/24/19   Larene Pickett, PA-C  nitroGLYCERIN (NITROSTAT) 0.4 MG SL tablet Place 1 tablet (0.4 mg total) under the tongue every 5 (five) minutes as needed for chest pain. 12/08/15   Neldon Labella, NP  telmisartan (MICARDIS) 80 MG tablet Take 1 tablet (80 mg total) by mouth daily. Patient not taking: Reported on 12/24/2019 12/19/17   Plotnikov, Evie Lacks, MD    Allergies    Crestor [rosuvastatin calcium], Lipitor [atorvastatin], Lisinopril, and Livalo [pitavastatin]  Review of Systems   Review of Systems  Constitutional: Negative for fever.  Respiratory: Positive for shortness of breath.   Cardiovascular: Positive for chest pain. Negative for leg swelling.  Gastrointestinal: Positive for nausea. Negative for blood in stool.  Genitourinary: Negative for  hematuria and vaginal bleeding.  Neurological: Negative for weakness.  All other systems reviewed and are negative.   Physical Exam Updated Vital Signs Ht 1.575 m (5\' 2" )   Wt 90 kg   LMP 11/17/2017   BMI 36.29 kg/m   Physical Exam CONSTITUTIONAL: Well developed/well nourished HEAD: Normocephalic/atraumatic EYES: EOMI/PERRL ENMT: Mucous membranes moist NECK: supple no meningeal signs, no JVD SPINE/BACK:entire spine nontender CV: S1/S2 noted, no murmurs/rubs/gallops noted LUNGS: Lungs are clear to auscultation bilaterally, no apparent distress ABDOMEN: soft, nontender, no rebound or guarding, bowel sounds noted throughout abdomen GU:no cva tenderness NEURO: Pt is awake/alert/appropriate, moves all extremitiesx4. No arm or leg drift  EXTREMITIES: pulses normal/equalx4, full ROM, no calf tenderness or edema SKIN: warm, color normal PSYCH: no abnormalities of mood noted, alert and oriented to situation  ED Results / Procedures / Treatments   Labs (all labs ordered are listed, but only abnormal results are displayed) Labs Reviewed  BASIC METABOLIC PANEL - Abnormal; Notable for the following components:      Result Value   Glucose, Bld 106 (*)    All other components within normal limits  RESPIRATORY PANEL BY RT PCR (FLU A&B, COVID)  CBC  PROTIME-INR  HEPARIN LEVEL (UNFRACTIONATED)  I-STAT BETA HCG BLOOD, ED (MC, [facility], AP ONLY)  TROPONIN I (HIGH SENSITIVITY)  TROPONIN I (HIGH SENSITIVITY)    EKG EKG Interpretation  Date/Time:  Saturday July 11 2020 05:08:46 EDT Ventricular Rate:  54 PR Interval:  216 QRS Duration: 90 QT Interval:  418 QTC Calculation: 396 R Axis:   57 Text Interpretation: Sinus bradycardia with 1st degree A-V block T wave abnormality, consider anterolateral ischemia , new since prior tracings Abnormal ECG Confirmed by Addison Lank [phone]) on 07/11/2020 5:25:13 AM   Radiology DG Chest Port 1 View  Result Date: 07/11/2020 CLINICAL DATA:   Chest pain EXAM: PORTABLE CHEST 1 VIEW COMPARISON:  12/24/2019 rib/chest radiographs and prior. FINDINGS: The heart size and mediastinal contours are within normal limits. Both lungs are clear. No acute osseous abnormality. IMPRESSION: No focal airspace disease. Electronically Signed   By: Primitivo Gauze M.D.   On: 07/11/2020 06:55    Procedures .Critical Care Performed  by: Ripley Fraise, MD Authorized by: Ripley Fraise, MD   Critical care provider statement:    Critical care time (minutes):  35   Critical care start time:  07/11/2020 6:20 AM   Critical care end time:  07/11/2020 6:55 AM   Critical care time was exclusive of:  Separately billable procedures and treating other patients   Critical care was necessary to treat or prevent imminent or life-threatening deterioration of the following conditions:  Cardiac failure   Critical care was time spent personally by me on the following activities:  Ordering and review of radiographic studies, ordering and review of laboratory studies, ordering and performing treatments and interventions, pulse oximetry, re-evaluation of patient's condition, review of old charts, examination of patient, evaluation of patient's response to treatment, discussions with consultants, obtaining history from patient or surrogate and development of treatment plan with patient or surrogate   I assumed direction of critical care for this patient from another provider in my specialty: no        Medications Ordered in ED Medications  nitroGLYCERIN (NITROSTAT) SL tablet 0.4 mg (0.4 mg Sublingual Given 07/11/20 0635)  aspirin chewable tablet 162 mg (162 mg Oral Given 07/11/20 5697)    ED Course  I have reviewed the triage vital signs and the nursing notes.  Pertinent labs & imaging results that were available during my care of the patient were reviewed by me and considered in my medical decision making (see chart for details).    MDM  Rules/Calculators/A&P HEAR Score: 6                       6:18 AM Patient with history of CAD presents with chest pain that she reports feels very similar to prior episodes.  She does have EKG changes.  Labs and x-ray pending at this time Patient has previously been managed by [facility] cardiology, Dr. Einar Gip but also has a cardiologist in [facility].  Patient prefers to be managed here in [place] 6:24 AM D/w Dr Virgina Jock with [facility] We discussed history, EKG and lab findings.  He requests admission to his service, start nitro and heparin. 7:13 AM Pt reports improvement with NTG BP is improving Admission orders placed  Final Clinical Impression(s) / ED Diagnoses Final diagnoses:  Unstable angina [facility])    Rx / DC Orders ED Discharge Orders    None       Ripley Fraise, MD 07/11/20 [phone]

## 2020-07-11 NOTE — H&P (Signed)
CARDIOLOGY CONSULT NOTE  Patient ID: [name] MRN: [id number] DOB/AGE: [birth year] 54 y.o.  Admit date: 07/11/2020 Referring Physician: [facility] ED Reason for Consultation:  Chest pain  HPI:   54 y/o AAF w/hypertension, hyperlipidemia, CAD s/p LAD PCI (2017), admitted with chest pain.  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 Patient has been under a lot of stress at work. Recently, she ran out of her SL NTG. She started experiencing retrosternal chest pressure at 11 PM last night. Eventually, she presented to the ED this morning. Chest pain is nitrate responsive, but returns soon after.  EKG showed lateral T wave inversion. HS trop 4. She currently has 6/10 chest pain.  Coronary angiogram in 11/2017, showed patent mid LAD stent, no other significant disease noted.  Past Medical History:  Diagnosis Date  . Acute pancreatitis 2011  . Allergy   . Anemia   . CAD (coronary artery disease) 2009   Dr Olegario Shearer -[facility], Heart Cath  . Family history of adverse reaction to anesthesia    "mother and brother have difficulty waking up out from it"  . GERD (gastroesophageal reflux disease)   . Hyperlipidemia   . Hypertension   . Hypothyroidism    "recently dx'd" (12/07/2015)  . PVC (premature ventricular contraction)   . Statin intolerance   . STD (sexually transmitted disease)    CHL 20 yrs ago  . Thyroid nodule 2010   "precancerous; thyroid treated with radiation"  . Tubular adenoma of colon 06/2015     Past Surgical History:  Procedure Laterality Date  . CARDIAC CATHETERIZATION     unable to do cardiac cath but does stress echo 11/22/11  . CARDIAC CATHETERIZATION N/A 12/04/2015   Procedure: Left Heart Cath and Coronary Angiography;  Surgeon: Adrian Prows, MD;  Location: [facility];  Service: Cardiovascular;  Laterality: N/A;  . CARDIAC CATHETERIZATION N/A 12/04/2015   Procedure: Intravascular Pressure Wire/FFR Study;   Surgeon: Adrian Prows, MD;  Location: [facility];  Service: Cardiovascular;  Laterality: N/A;  . CARDIAC CATHETERIZATION N/A 12/07/2015   Procedure: Coronary Stent Intervention;  Surgeon: Adrian Prows, MD;  Location: [facility];  Service: Cardiovascular;  Laterality: N/A;  . CORONARY STENT PLACEMENT    . EYE MUSCLE SURGERY Left 1969  . LAPAROSCOPIC CHOLECYSTECTOMY  2008  . LEFT HEART CATH AND CORONARY ANGIOGRAPHY N/A 11/24/2017   Procedure: LEFT HEART CATH AND CORONARY ANGIOGRAPHY;  Surgeon: Nigel Mormon, MD;  Location: [facility];  Service: Cardiovascular;  Laterality: N/A;  . TUBAL LIGATION Bilateral 1995  . UPPER GASTROINTESTINAL ENDOSCOPY    . VENTRICULAR ABLATION SURGERY  2006,2010   times 2, follow-up for v-tack     Family History  Problem Relation Age of Onset  . Hypertension Father   . Prostate cancer Father   . Heart attack Father   . Colon cancer Father 28  . Hypertension Mother   . Diabetes Mother   . Crohn's disease Daughter   . Heart attack Cousin   . Diabetes Maternal Grandmother   . Cancer Sister        ovarian  . Diabetes Brother   . Esophageal cancer Neg Hx   . Rectal cancer Neg Hx   . Stomach cancer Neg Hx      Social History: Social History   Socioeconomic History  . Marital status: Divorced    Spouse name: Not on file  . Number of children:  4  . Years of education: Not on file  . Highest education level: Not on file  Occupational History  . Occupation: Solicitor: [employer]  . 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 Smoking status: Never Smoker  . Smokeless tobacco: Never Used  Substance and Sexual Activity  . Alcohol use: No  . Drug use: No  . Sexual activity: Not Currently    Partners: Male    Birth control/protection: Surgical, Abstinence    Comment: BTL  Other Topics Concern  . Not on file  Social History Narrative   FAMILY HISTORY   History of hypertension   History  of prostate cancer 1st degree relative <50   D, S ovar. [place]      Regular exercise - YES      GYN Dr Posey Pronto - [place]   Social Determinants of Health   Financial Resource Strain:   . Difficulty of Paying Living Expenses: Not on file  Food Insecurity:   . Worried About Charity fundraiser in the Last Year: Not on file  . Ran Out of Food in the Last Year: Not on file  Transportation Needs:   . Lack of Transportation (Medical): Not on file  . Lack of Transportation (Non-Medical): Not on file  Physical Activity:   . Days of Exercise per Week: Not on file  . Minutes of Exercise per Session: Not on file  Stress:   . Feeling of Stress : Not on file  Social Connections:   . Frequency of Communication with Friends and Family: Not on file  . Frequency of Social Gatherings with Friends and Family: Not on file  . Attends Religious Services: Not on file  . Active Member of Clubs or Organizations: Not on file  . Attends Archivist Meetings: Not on file  . Marital Status: Not on file  Intimate Partner Violence:   . Fear of Current or Ex-Partner: Not on file  . Emotionally Abused: Not on file  . Physically Abused: Not on file  . Sexually Abused: Not on file     (Not in a hospital admission)   Review of Systems  Constitutional: Negative for decreased appetite, malaise/fatigue, weight gain and weight loss.  HENT: Negative for congestion.   Eyes: Negative for visual disturbance.  Cardiovascular: Positive for chest pain. Negative for dyspnea on exertion, leg swelling, palpitations and syncope.  Respiratory: Negative for cough.   Endocrine: Negative for cold intolerance.  Hematologic/Lymphatic: Does not bruise/bleed easily.  Skin: Negative for itching and rash.  Musculoskeletal: Negative for myalgias.  Gastrointestinal: Negative for abdominal pain, nausea and vomiting.  Genitourinary: Negative for dysuria.  Neurological: Negative for dizziness and weakness.  Psychiatric/Behavioral:  The patient is not nervous/anxious.   All other systems reviewed and are negative.     Physical Exam: Physical Exam Vitals and nursing note reviewed.  Constitutional:      General: She is not in acute distress.    Appearance: She is well-developed.  HENT:     Head: Normocephalic and atraumatic.  Eyes:     Conjunctiva/sclera: Conjunctivae normal.     Pupils: Pupils are equal, round, and reactive to light.  Neck:     Vascular: No JVD.  Cardiovascular:     Rate and Rhythm: Normal rate and regular rhythm.     Pulses: Normal pulses and intact distal pulses.     Heart sounds: No murmur heard.   Pulmonary:  Effort: Pulmonary effort is normal.     Breath sounds: Normal breath sounds. No wheezing or rales.  Abdominal:     General: Bowel sounds are normal.     Palpations: Abdomen is soft.     Tenderness: There is no rebound.  Musculoskeletal:        General: No tenderness. Normal range of motion.     Left lower leg: No edema.  Lymphadenopathy:     Cervical: No cervical adenopathy.  Skin:    General: Skin is warm and dry.  Neurological:     Mental Status: She is alert and oriented to person, place, and time.     Cranial Nerves: No cranial nerve deficit.      Labs:   Lab Results  Component Value Date   WBC 5.7 07/11/2020   HGB 13.5 07/11/2020   HCT 42.9 07/11/2020   MCV 94.1 07/11/2020   PLT 264 07/11/2020    Recent Labs  Lab 07/11/20 0521  NA 142  K 4.2  CL 104  CO2 29  BUN 7  CREATININE 0.94  CALCIUM 9.7  GLUCOSE 106*    Lipid Panel     Component Value Date/Time   CHOL 171 03/19/2019 0815   TRIG 100.0 03/19/2019 0815   HDL 58.00 03/19/2019 0815   CHOLHDL 3 03/19/2019 0815   VLDL 20.0 03/19/2019 0815   LDLCALC 93 03/19/2019 0815    BNP (last 3 results) No results for input(s): BNP in the last 8760 hours.  HEMOGLOBIN A1C Lab Results  Component Value Date   HGBA1C 5.6 12/04/2015   MPG 114 12/04/2015    Cardiac Panel (last 3 results) No  results for input(s): CKTOTAL, CKMB, RELINDX in the last 8760 hours.  Invalid input(s): TROPONINHS  Lab Results  Component Value Date   CKTOTAL 156 03/19/2019   CKMB 0.7 02/20/2011       Radiology: [facility] Chest Port 1 View  Result Date: 07/11/2020 CLINICAL DATA:  Chest pain EXAM: PORTABLE CHEST 1 VIEW COMPARISON:  12/24/2019 rib/chest radiographs and prior. FINDINGS: The heart size and mediastinal contours are within normal limits. Both lungs are clear. No acute osseous abnormality. IMPRESSION: No focal airspace disease. Electronically Signed   By: Primitivo Gauze M.D.   On: 07/11/2020 06:55    Scheduled Meds: Continuous Infusions: . heparin 850 Units/hr (07/11/20 0835)   PRN Meds:.  CARDIAC STUDIES:  EKG 07/11/2020: Sinus rhythm Lateral T wave inversion  Coronary angiogram 11/24/2017: Mild nonobstructive CAD Patent mid LAD stent with no restenosis  Echocardiogram 2017: EF 56% No significant valvular abnormality   Assessment & Recommendations:  54 y/o AAF w/hypertension, hyperlipidemia, CAD s/p LAD PCI (2017), admitted with chest pain.  Chest pain: Unstable angina vs hypertensive urgency. If second trop HS negative, will perform stress test. If positive, will proceed with cath  Hypertension: Continue home meds of atenolol 50, amlodipine 5, telmisartan 80  Hyperlipidemia: On Livalo. Unable to tolerate high intensity statin    Nigel Mormon, MD Pager: [phone] Office: [phone]

## 2020-07-11 NOTE — ED Triage Notes (Signed)
Patient reports mid chest pain radiating to left neck onset last night with SOB , history of CAD/Coronary Stent , denies emesis or diaphoresis .

## 2020-07-11 NOTE — Progress Notes (Signed)
ANTICOAGULATION CONSULT NOTE - Initial Consult  Pharmacy Consult for Heparin Indication: chest pain/ACS  Allergies  Allergen Reactions  . Crestor [Rosuvastatin Calcium] Other (See Comments)    Muscle aches and pains  . Lipitor [Atorvastatin] Other (See Comments)    Muscle aches and pains  . Lisinopril Cough  . Livalo [Pitavastatin]     achy    Patient Measurements: Height: 5\' 2"  (157.5 cm) Weight: 83.9 kg (185 lb) IBW/kg (Calculated) : 50.1 Heparin Dosing Weight: 69 kg  Vital Signs: Temp: 98 F (36.7 C) (10/16 0627) Temp Source: Oral (10/16 0627) BP: 188/102 (10/16 0630) Pulse Rate: 60 (10/16 0630)  Labs: Recent Labs    07/11/20 0521  HGB 13.5  HCT 42.9  PLT 264  LABPROT 12.4  INR 1.0  CREATININE 0.94  TROPONINIHS 4    Estimated Creatinine Clearance: 68.7 mL/min (by C-G formula based on SCr of 0.94 mg/dL).   Medical History: Past Medical History:  Diagnosis Date  . Acute pancreatitis 2011  . Allergy   . Anemia   . CAD (coronary artery disease) 2009   Dr Olegario Shearer -[facility], Heart Cath  . Family history of adverse reaction to anesthesia    "mother and brother have difficulty waking up out from it"  . GERD (gastroesophageal reflux disease)   . Hyperlipidemia   . Hypertension   . Hypothyroidism    "recently dx'd" (12/07/2015)  . PVC (premature ventricular contraction)   . Statin intolerance   . STD (sexually transmitted disease)    CHL 20 yrs ago  . Thyroid nodule 2010   "precancerous; thyroid treated with radiation"  . Tubular adenoma of colon 06/2015    Assessment: A 54 year old patient with a history of hypertension and hypercholesterolemia presents for evaluation of chest pain. Initial onset of pain was approximately 3-6 hours ago. Pharmacy consulted to start heaprin drip.  Goal of Therapy:  Heparin level 0.3-0.7 units/ml Monitor platelets by anticoagulation protocol: Yes   Plan:  Give 4000 units bolus x 1 Start heparin infusion at 850  units/hr Check anti-Xa level in 8 hours and daily while on heparin Continue to monitor H&H and platelets  Alanda Slim, PharmD, [facility] Clinical Pharmacist Please see AMION for all Pharmacists' Contact Phone Numbers 07/11/2020, 6:58 AM

## 2020-07-22 ENCOUNTER — Ambulatory Visit: Payer: PRIVATE HEALTH INSURANCE | Admitting: Family

## 2020-08-06 ENCOUNTER — Ambulatory Visit (HOSPITAL_COMMUNITY)
Admission: EM | Admit: 2020-08-06 | Discharge: 2020-08-06 | Disposition: A | Discharge status: Home / Self Care | Payer: PRIVATE HEALTH INSURANCE | Attending: Family Medicine | Admitting: Family Medicine

## 2020-08-06 ENCOUNTER — Telehealth (HOSPITAL_COMMUNITY): Payer: Self-pay

## 2020-08-06 ENCOUNTER — Encounter (HOSPITAL_COMMUNITY): Payer: Self-pay | Admitting: Emergency Medicine

## 2020-08-06 ENCOUNTER — Ambulatory Visit (INDEPENDENT_AMBULATORY_CARE_PROVIDER_SITE_OTHER): Payer: PRIVATE HEALTH INSURANCE

## 2020-08-06 ENCOUNTER — Other Ambulatory Visit: Payer: Self-pay

## 2020-08-06 DIAGNOSIS — W57XXXA Bitten or stung by nonvenomous insect and other nonvenomous arthropods, initial encounter: Secondary | ICD-10-CM | POA: Diagnosis not present

## 2020-08-06 DIAGNOSIS — Y92009 Unspecified place in unspecified non-institutional (private) residence as the place of occurrence of the external cause: Secondary | ICD-10-CM | POA: Diagnosis not present

## 2020-08-06 DIAGNOSIS — M25532 Pain in left wrist: Secondary | ICD-10-CM

## 2020-08-06 DIAGNOSIS — M25522 Pain in left elbow: Secondary | ICD-10-CM

## 2020-08-06 DIAGNOSIS — W19XXXA Unspecified fall, initial encounter: Secondary | ICD-10-CM | POA: Diagnosis not present

## 2020-08-06 DIAGNOSIS — M25422 Effusion, left elbow: Secondary | ICD-10-CM | POA: Diagnosis not present

## 2020-08-06 MED ORDER — PREDNISONE 10 MG PO TABS: ORAL_TABLET | ORAL | 0 refills | Status: DC

## 2020-08-06 MED ORDER — TRIAMCINOLONE ACETONIDE 0.1 % EX CREA: 1.0000 "application " | TOPICAL_CREAM | Freq: Two times a day (BID) | CUTANEOUS | 0 refills | Status: DC

## 2020-08-06 MED ORDER — CYCLOBENZAPRINE HCL 5 MG PO TABS: 5.0000 mg | ORAL_TABLET | Freq: Three times a day (TID) | ORAL | 0 refills | Status: DC | PRN

## 2020-08-06 MED ORDER — PREDNISONE 10 MG PO TABS
ORAL_TABLET | ORAL | 0 refills | Status: DC
Start: 2020-08-06 — End: 2021-01-29

## 2020-08-06 MED ORDER — TRIAMCINOLONE ACETONIDE 0.1 % EX CREA
1.0000 | TOPICAL_CREAM | Freq: Two times a day (BID) | CUTANEOUS | 0 refills | Status: DC
Start: 2020-08-06 — End: 2021-01-29

## 2020-08-06 NOTE — ED Triage Notes (Addendum)
Pt c/o fall last night, she states she was trying to get up to prevent her husband from falling and fell back and hit the dresser. She has pain in her left elbow and has limited ROM. Pt states she also had insect bites on her right arm, left arm and face x 1 week. Pt states they are continuing to burn. Pt states she has her home checked for bed bugs and her dog checked for fleas.

## 2020-08-10 NOTE — ED Provider Notes (Signed)
[facility]    CSN: [id number] Arrival date & time: 08/06/20  5329      History   Chief Complaint Chief Complaint  Patient presents with  . Fall  . Elbow Pain    left  . Insect Bite    HPI CRISTA NUON is a 54 y.o. female.   Patient presenting today with left elbow and forearm pain after falling into her dresser after losing her balance this morning. The arm has been swollen, stiff and very painful to move. Denies abrasions, bruising, numbness, tingling, inability to move fingers, dizziness, hitting her head, syncope. So far has been trying to keep the arm still and taking tylenol without much relief. Also notes concern over some very itchy bug bites she's been getting for several weeks now. Has had her house and pet tested for pests with no findings. Has been using OTC steroid creams without benefit.      Past Medical History:  Diagnosis Date  . Acute pancreatitis 2011  . Allergy   . Anemia   . CAD (coronary artery disease) 2009   Dr Olegario Shearer -[facility], Heart Cath  . Family history of adverse reaction to anesthesia    "mother and brother have difficulty waking up out from it"  . GERD (gastroesophageal reflux disease)   . Hyperlipidemia   . Hypertension   . Hypothyroidism    "recently dx'd" (12/07/2015)  . PVC (premature ventricular contraction)   . Statin intolerance   . STD (sexually transmitted disease)    CHL 20 yrs ago  . Thyroid nodule 2010   "precancerous; thyroid treated with radiation"  . Tubular adenoma of colon 06/2015    Patient Active Problem List   Diagnosis Date Noted  . Unstable angina ([place]) 07/11/2020  . Headache 12/05/2019  . Arthralgia 03/19/2019  . Right-sided chest wall pain 01/11/2019  . Upper respiratory infection 06/08/2018  . Hypothyroidism 12/13/2017  . Palpitations 12/28/2015  . CAD (coronary artery disease), native coronary artery 12/06/2015  . Non-cardiac chest pain 12/04/2015  . Epigastric abdominal pain  12/04/2015  . Bladder pain 05/14/2015  . Incomplete bladder emptying 05/14/2015  . Occipital neuralgia of left side 11/14/2013  . Aspiration into respiratory tract 04/03/2012  . Cough due to bronchospasm 04/03/2012  . Chest pain, atypical 04/03/2012  . HYPERGLYCEMIA 06/16/2010  . BACK PAIN, LUMBAR 05/27/2009  . THYROID NODULE, RIGHT 05/04/2009  . Hyperlipidemia with target LDL less than 70 12/31/2008  . H/O acute pancreatitis 12/31/2008  . LEG PAIN, BILATERAL 12/31/2008  . PARESTHESIA 12/31/2008  . EDEMA 12/31/2008  . RLQ abdominal pain 10/02/2007  . GERD 09/17/2007  . DYSPNEA 08/28/2007  . Dysphagia 08/28/2007  . Essential hypertension 04/24/2007    Past Surgical History:  Procedure Laterality Date  . CARDIAC CATHETERIZATION     unable to do cardiac cath but does stress echo 11/22/11  . CARDIAC CATHETERIZATION N/A 12/04/2015   Procedure: Left Heart Cath and Coronary Angiography;  Surgeon: Adrian Prows, MD;  Location: [facility];  Service: Cardiovascular;  Laterality: N/A;  . CARDIAC CATHETERIZATION N/A 12/04/2015   Procedure: Intravascular Pressure Wire/FFR Study;  Surgeon: Adrian Prows, MD;  Location: [facility];  Service: Cardiovascular;  Laterality: N/A;  . CARDIAC CATHETERIZATION N/A 12/07/2015   Procedure: Coronary Stent Intervention;  Surgeon: Adrian Prows, MD;  Location: [facility];  Service: Cardiovascular;  Laterality: N/A;  . CORONARY STENT PLACEMENT    . EYE MUSCLE SURGERY Left 1969  . LAPAROSCOPIC CHOLECYSTECTOMY  2008  . LEFT HEART CATH AND CORONARY ANGIOGRAPHY N/A 11/24/2017   Procedure: LEFT HEART CATH AND CORONARY ANGIOGRAPHY;  Surgeon: Nigel Mormon, MD;  Location: [facility];  Service: Cardiovascular;  Laterality: N/A;  . TUBAL LIGATION Bilateral 1995  . UPPER GASTROINTESTINAL ENDOSCOPY    . VENTRICULAR ABLATION SURGERY  2006,2010   times 2, follow-up for v-tack    OB History    Gravida  4   Para  4   Term  4   Preterm        AB      Living  4     SAB      TAB      Ectopic      Multiple      Live Births  4            Home Medications    Prior to Admission medications   Medication Sig Start Date End Date Taking? Authorizing Provider  amLODipine (NORVASC) 5 MG tablet Take 1 tablet (5 mg total) by mouth daily. Annual appt due in July must see provider for future refills 01/07/20  Yes Plotnikov, Evie Lacks, MD  atenolol (TENORMIN) 25 MG tablet Take 1 tablet (25 mg total) by mouth daily. -- Office visit needed for further refills Patient taking differently: Take 50 mg by mouth daily. -- Office visit needed for further refills 09/05/18  Yes Plotnikov, Evie Lacks, MD  Cholecalciferol (VITAMIN D) 2000 units tablet Take 2,000 Units by mouth daily.   Yes [provider]  butalbital-acetaminophen-caffeine (FIORICET) 50-325-40 MG tablet Take 1-2 tablets by mouth 2 (two) times daily as needed for headache. Patient not taking: Reported on 07/11/2020 12/05/19 12/04/20  Plotnikov, Evie Lacks, MD  cyclobenzaprine (FLEXERIL) 10 MG tablet Take 1 tablet (10 mg total) by mouth 3 (three) times daily as needed for muscle spasms. Patient not taking: Reported on 12/24/2019 01/11/19   Hoyt Koch, MD  cyclobenzaprine (FLEXERIL) 5 MG tablet Take 1 tablet (5 mg total) by mouth 3 (three) times daily as needed for muscle spasms. DO NOT DRINK ALCOHOL OR DRIVE WHILE TAKING THIS MEDICATION 08/06/20   Volney American, PA-C  isosorbide mononitrate (IMDUR) 60 MG 24 hr tablet Take 1 tablet (60 mg total) by mouth daily. 07/11/20   Patwardhan, Reynold Bowen, MD  levothyroxine (SYNTHROID) 25 MCG tablet Take 1 tablet (25 mcg total) by mouth daily before breakfast. Patient not taking: Reported on 07/11/2020 12/19/17   Renato Shin, MD  LIVALO 2 MG TABS TAKE 1 TABLET (2 MG TOTAL) BY MOUTH DAILY. Patient not taking: No sig reported 05/18/18   Plotnikov, Evie Lacks, MD  methocarbamol (ROBAXIN) 500 MG tablet Take 1 tablet (500 mg  total) by mouth 2 (two) times daily. Patient not taking: Reported on 07/11/2020 12/24/19   Larene Pickett, PA-C  naproxen (NAPROSYN) 500 MG tablet Take 1 tablet (500 mg total) by mouth 2 (two) times daily with a meal. Patient not taking: Reported on 07/11/2020 12/24/19   Larene Pickett, PA-C  nitroGLYCERIN (NITROSTAT) 0.4 MG SL tablet Place 1 tablet (0.4 mg total) under the tongue every 5 (five) minutes as needed for chest pain. 12/08/15   Neldon Labella, NP  predniSONE (DELTASONE) 10 MG tablet Take 6 tabs day one, 5 tabs day two, 4 tabs day three, etc 08/06/20   Volney American, PA-C  telmisartan (MICARDIS) 80 MG tablet Take 1 tablet (80 mg total) by mouth daily. Patient not taking: Reported on 12/24/2019 12/19/17  Plotnikov, Evie Lacks, MD  triamcinolone cream (KENALOG) 0.1 % Apply 1 application topically 2 (two) times daily. 08/06/20   Volney American, PA-C    Family History Family History  Problem Relation Age of Onset  . Hypertension Father   . Prostate cancer Father   . Heart attack Father   . Colon cancer Father 30  . Hypertension Mother   . Diabetes Mother   . Crohn's disease Daughter   . Heart attack Cousin   . Diabetes Maternal Grandmother   . Cancer Sister        ovarian  . Diabetes Brother   . Esophageal cancer Neg Hx   . Rectal cancer Neg Hx   . Stomach cancer Neg Hx     Social History Social History   Tobacco Use  . Smoking status: Never Smoker  . Smokeless tobacco: Never Used  Substance Use Topics  . Alcohol use: No  . Drug use: No     Allergies   Crestor [rosuvastatin calcium], Lipitor [atorvastatin], Lisinopril, and Livalo [pitavastatin]   Review of Systems Review of Systems PER HPI    Physical Exam Triage Vital Signs ED Triage Vitals  Enc Vitals Group     BP 08/06/20 1049 (!) 152/87     Pulse Rate 08/06/20 1049 (!) 53     Resp 08/06/20 1049 19     Temp 08/06/20 1049 97.8 F (36.6 C)     Temp Source 08/06/20 1049 Oral      SpO2 08/06/20 1049 99 %     Weight --      Height --      Head Circumference --      Peak Flow --      Pain Score 08/06/20 1043 9     Pain Loc --      Pain Edu? --      Excl. in [place]? --    No data found.  Updated Vital Signs BP (!) 152/87 (BP Location: Left Arm)   Pulse (!) 53   Temp 97.8 F (36.6 C) (Oral)   Resp 19   LMP 11/17/2017   SpO2 99%   Visual Acuity Right Eye Distance:   Left Eye Distance:   Bilateral Distance:    Right Eye Near:   Left Eye Near:    Bilateral Near:     Physical Exam Vitals and nursing note reviewed.  Constitutional:      Appearance: Normal appearance. She is not ill-appearing.  HENT:     Head: Atraumatic.  Eyes:     Extraocular Movements: Extraocular movements intact.     Conjunctiva/sclera: Conjunctivae normal.  Cardiovascular:     Rate and Rhythm: Normal rate and regular rhythm.     Pulses: Normal pulses.     Heart sounds: Normal heart sounds.  Pulmonary:     Effort: Pulmonary effort is normal.     Breath sounds: Normal breath sounds.  Musculoskeletal:        General: Swelling (mild swelling left elbow into forearm, ttp in these areas), tenderness and signs of injury present. No deformity.     Cervical back: Normal range of motion and neck supple.     Comments: Good ROM of left fingers and wrist, though wrist also ttp  Skin:    General: Skin is warm and dry.     Findings: No erythema or lesion.     Comments: Multiple clusters of what appears to be insect bites, erythematous papules with scabbing on some from scratching.  No evidence of infection at any of the sites visualized  Neurological:     General: No focal deficit present.     Mental Status: She is alert and oriented to person, place, and time.     Sensory: No sensory deficit.     Motor: No weakness.     Gait: Gait normal.  Psychiatric:        Mood and Affect: Mood normal.        Thought Content: Thought content normal.        Judgment: Judgment normal.      UC  Treatments / Results  Labs (all labs ordered are listed, but only abnormal results are displayed) Labs Reviewed - No data to display  EKG   Radiology No results found.  Procedures Procedures (including critical care time)  Medications Ordered in UC Medications - No data to display  Initial Impression / Assessment and Plan / UC Course  I have reviewed the triage vital signs and the nursing notes.  Pertinent labs & imaging results that were available during my care of the patient were reviewed by me and considered in my medical decision making (see chart for details).     Given her swelling and tenderness at left wrist and elbow post-fall, x-rays obtained of both areas. These were negative for bony injury. Discussed flexeril, kenalog IM for inflammation, heat, rest. Will also write triamcinolone cream for the itchy bites across her body and discussed possible exterminator to treat house for all pests in case this is the issue. F/u with PCP if not resolving.   Final Clinical Impressions(s) / UC Diagnoses   Final diagnoses:  Left elbow pain  Fall in home, initial encounter  Insect bite, unspecified site, initial encounter   Discharge Instructions   None    ED Prescriptions    Medication Sig Dispense Auth. Provider   triamcinolone cream (KENALOG) 0.1 % Apply 1 application topically 2 (two) times daily. 60 g Volney American, [place]   cyclobenzaprine (FLEXERIL) 5 MG tablet Take 1 tablet (5 mg total) by mouth 3 (three) times daily as needed for muscle spasms. DO NOT DRINK ALCOHOL OR DRIVE WHILE TAKING THIS MEDICATION 15 tablet Volney American, PA-C   predniSONE (DELTASONE) 10 MG tablet Take 6 tabs day one, 5 tabs day two, 4 tabs day three, etc 21 tablet Volney American, [place]     PDMP not reviewed this encounter.   Merrie Roof [place], [place] 08/10/20 [phone]

## 2020-11-06 ENCOUNTER — Encounter: Payer: Self-pay | Admitting: Gastroenterology

## 2020-11-18 ENCOUNTER — Other Ambulatory Visit: Payer: Self-pay

## 2020-11-18 ENCOUNTER — Ambulatory Visit (INDEPENDENT_AMBULATORY_CARE_PROVIDER_SITE_OTHER): Payer: PRIVATE HEALTH INSURANCE

## 2020-11-18 DIAGNOSIS — Z23 Encounter for immunization: Secondary | ICD-10-CM | POA: Diagnosis not present

## 2020-11-20 ENCOUNTER — Encounter: Payer: Self-pay | Admitting: Gastroenterology

## 2020-12-23 ENCOUNTER — Telehealth: Payer: Self-pay | Admitting: Internal Medicine

## 2020-12-23 NOTE — Telephone Encounter (Signed)
Pt can make appt w/her PCP, but his first available is not until 4/11. May schedule with one of the other providers sooner.Marland KitchenJohny Chess

## 2020-12-23 NOTE — Telephone Encounter (Signed)
Patient is wanting to see her cardiologist first instead of scheduling right now.

## 2020-12-23 NOTE — Telephone Encounter (Signed)
Patient started having lower leg pain on Monday. BP readings have been around 45-55 since Monday. She is wondering if she should go see her cardiologist or see her PCP instead.  Please advise and call her back at [phone]

## 2021-01-18 ENCOUNTER — Ambulatory Visit (INDEPENDENT_AMBULATORY_CARE_PROVIDER_SITE_OTHER): Payer: PRIVATE HEALTH INSURANCE

## 2021-01-18 ENCOUNTER — Other Ambulatory Visit: Payer: Self-pay

## 2021-01-18 DIAGNOSIS — Z23 Encounter for immunization: Secondary | ICD-10-CM

## 2021-01-18 NOTE — Progress Notes (Addendum)
Pt here for 2nd Shingrix injection per    Shingrix left deltoid IM, and pt tolerated injection well.   Medical screening examination/treatment/procedure(s) were performed by non-physician practitioner and as supervising physician I was immediately available for consultation/collaboration.  I agree with above. Lew Dawes, MD

## 2021-01-29 ENCOUNTER — Other Ambulatory Visit: Payer: Self-pay

## 2021-01-29 ENCOUNTER — Ambulatory Visit (AMBULATORY_SURGERY_CENTER): Payer: Self-pay | Admitting: *Deleted

## 2021-01-29 VITALS — Ht 62.0 in | Wt 181.0 lb

## 2021-01-29 DIAGNOSIS — Z86010 Personal history of colonic polyps: Secondary | ICD-10-CM

## 2021-01-29 DIAGNOSIS — Z800 Family history of malignant neoplasm of digestive organs: Secondary | ICD-10-CM

## 2021-01-29 NOTE — Progress Notes (Signed)
ambNo egg or soy allergy known to patient  No issues with past sedation with any surgeries or procedures- last colon almost passed out post colon  Patient denies ever being told they had issues or difficulty with intubation  No FH of Malignant Hyperthermia No diet pills per patient No home 02 use per patient  No blood thinners per patient  Pt denies issues with constipation  No A fib or A flutter  EMMI video to pt or via [place] 19 guidelines implemented in PV today with Pt and RN  Pt is fully vaccinated  for Covid   Due to the COVID-19 pandemic we are asking patients to follow certain guidelines.  Pt aware of COVID protocols and [facility] guidelines

## 2021-02-09 ENCOUNTER — Encounter: Payer: Self-pay | Admitting: Gastroenterology

## 2021-02-12 ENCOUNTER — Encounter: Payer: Self-pay | Admitting: Gastroenterology

## 2021-02-12 ENCOUNTER — Other Ambulatory Visit: Payer: Self-pay

## 2021-02-12 ENCOUNTER — Ambulatory Visit (AMBULATORY_SURGERY_CENTER): Payer: PRIVATE HEALTH INSURANCE | Admitting: Gastroenterology

## 2021-02-12 VITALS — BP 146/73 | HR 57 | Temp 96.6°F | Resp 16 | Ht 62.0 in | Wt 181.0 lb

## 2021-02-12 DIAGNOSIS — Z800 Family history of malignant neoplasm of digestive organs: Secondary | ICD-10-CM

## 2021-02-12 DIAGNOSIS — Z86010 Personal history of colonic polyps: Secondary | ICD-10-CM

## 2021-02-12 MED ORDER — SODIUM CHLORIDE 0.9 % IV SOLN: 500.0000 mL | Freq: Once | INTRAVENOUS | Status: DC

## 2021-02-12 NOTE — Patient Instructions (Addendum)
You may resume your current medications today. Repeat colonoscopy in 5 years per Dr. Fuller Plan. Please call if any questions or concerns.    YOU HAD AN ENDOSCOPIC PROCEDURE TODAY AT [facility]:   Refer to the procedure report that was given to you for any specific questions about what was found during the examination.  If the procedure report does not answer your questions, please call your gastroenterologist to clarify.  If you requested that your care partner not be given the details of your procedure findings, then the procedure report has been included in a sealed envelope for you to review at your convenience later.  YOU SHOULD EXPECT: Some feelings of bloating in the abdomen. Passage of more gas than usual.  Walking can help get rid of the air that was put into your GI tract during the procedure and reduce the bloating. If you had a lower endoscopy (such as a colonoscopy or flexible sigmoidoscopy) you may notice spotting of blood in your stool or on the toilet paper. If you underwent a bowel prep for your procedure, you may not have a normal bowel movement for a few days.  Please Note:  You might notice some irritation and congestion in your nose or some drainage.  This is from the oxygen used during your procedure.  There is no need for concern and it should clear up in a day or so.  SYMPTOMS TO REPORT IMMEDIATELY:   Following lower endoscopy (colonoscopy or flexible sigmoidoscopy):  Excessive amounts of blood in the stool  Significant tenderness or worsening of abdominal pains  Swelling of the abdomen that is new, acute  Fever of 100F or higher  For urgent or emergent issues, a gastroenterologist can be reached at any hour by calling [phone]. Do not use MyChart messaging for urgent concerns.    DIET:  We do recommend a small meal at first, but then you may proceed to your regular diet.  Drink plenty of fluids but you should avoid alcoholic beverages for 24  hours.  ACTIVITY:  You should plan to take it easy for the rest of today and you should NOT DRIVE or use heavy machinery until tomorrow (because of the sedation medicines used during the test).    FOLLOW UP: Our staff will call the number listed on your records 48-72 hours following your procedure to check on you and address any questions or concerns that you may have regarding the information given to you following your procedure. If we do not reach you, we will leave a message.  We will attempt to reach you two times.  During this call, we will ask if you have developed any symptoms of COVID 19. If you develop any symptoms (ie: fever, flu-like symptoms, shortness of breath, cough etc.) before then, please call [phone].  If you test positive for Covid 19 in the 2 weeks post procedure, please call and report this information to [place].    If any biopsies were taken you will be contacted by phone or by letter within the next 1-3 weeks.  Please call us at [phone] if you have not heard about the biopsies in 3 weeks.    SIGNATURES/CONFIDENTIALITY: You and/or your care partner have signed paperwork which will be entered into your electronic medical record.  These signatures attest to the fact that that the information above on your After Visit Summary has been reviewed and is understood.  Full responsibility of the confidentiality of this discharge information  lies with you and/or your care-partner.

## 2021-02-12 NOTE — Progress Notes (Signed)
Report to PACU, RN, vss, BBS= Clear.  

## 2021-02-12 NOTE — Progress Notes (Signed)
Pt's states no medical or surgical changes since previsit or office visit. 

## 2021-02-12 NOTE — Op Note (Signed)
[facility] Patient Name: [name] Procedure Date: 02/12/2021 9:35 AM MRN: [id number] Endoscopist: Ladene Artist , MD Age: 55 Referring MD:  Date of Birth: 01-Mar-1966 Gender: Female Account #: [id number] Procedure:                Colonoscopy Indications:              Surveillance: Personal history of adenomatous                            polyps on last colonoscopy > 5 years ago. Family                            history of colon cancer, first degree relative. Medicines:                Monitored Anesthesia Care Procedure:                Pre-Anesthesia Assessment:                           - Prior to the procedure, a History and Physical                            was performed, and patient medications and                            allergies were reviewed. The patient's tolerance of                            previous anesthesia was also reviewed. The risks                            and benefits of the procedure and the sedation                            options and risks were discussed with the patient.                            All questions were answered, and informed consent                            was obtained. Prior Anticoagulants: The patient has                            taken no previous anticoagulant or antiplatelet                            agents. ASA Grade Assessment: II - A patient with                            mild systemic disease. After reviewing the risks                            and benefits, the patient was deemed in  satisfactory condition to undergo the procedure.                           After obtaining informed consent, the colonoscope                            was passed under direct vision. Throughout the                            procedure, the patient's blood pressure, pulse, and                            oxygen saturations were monitored continuously. The                            Olympus  [id number]-H190DL ([id number]) Colonoscope was                            introduced through the anus and advanced to the the                            cecum, identified by appendiceal orifice and                            ileocecal valve. The ileocecal valve, appendiceal                            orifice, and rectum were photographed. The quality                            of the bowel preparation was good. The colonoscopy                            was performed without difficulty. The patient                            tolerated the procedure well. Scope In: 9:44:19 AM Scope Out: 9:59:18 AM Scope Withdrawal Time: 0 hours 11 minutes 42 seconds  Total Procedure Duration: 0 hours 14 minutes 59 seconds  Findings:                 The perianal and digital rectal examinations were                            normal.                           The entire examined colon appeared normal on direct                            and retroflexion views. Complications:            No immediate complications. Estimated blood loss:                            None. Estimated Blood Loss:  Estimated blood loss: none. Impression:               - The entire examined colon is normal on direct and                            retroflexion views.                           - No specimens collected. Recommendation:           - Repeat colonoscopy in 5 years for surveillance.                           - Patient has a contact number available for                            emergencies. The signs and symptoms of potential                            delayed complications were discussed with the                            patient. Return to normal activities tomorrow.                            Written discharge instructions were provided to the                            patient.                           - Resume previous diet.                           - Continue present medications. Ladene Artist, MD 02/12/2021  10:05:05 AM This report has been signed electronically.

## 2021-02-12 NOTE — Progress Notes (Signed)
No problems noted in the recovery room. maw 

## 2021-02-16 ENCOUNTER — Telehealth: Payer: Self-pay

## 2021-02-16 NOTE — Telephone Encounter (Signed)
  Follow up Call-  Call back number 02/12/2021  Post procedure Call Back phone  # [phone]  Permission to leave phone message Yes  Some recent data might be hidden     Patient questions:  Do you have a fever, pain , or abdominal swelling? No. Pain Score  0 *  Have you tolerated food without any problems? Yes.    Have you been able to return to your normal activities? Yes.    Do you have any questions about your discharge instructions: Diet   No. Medications  No. Follow up visit  No.  Do you have questions or concerns about your Care? No.  Actions: * If pain score is 4 or above: No action needed, pain <4.  1. Have you developed a fever since your procedure? no  2.   Have you had an respiratory symptoms (SOB or cough) since your procedure? no  3.   Have you tested positive for COVID 19 since your procedure no  4.   Have you had any family members/close contacts diagnosed with the COVID 19 since your procedure?  no   If yes to any of these questions please route to Joylene John, RN and Joella Prince, RN

## 2021-05-28 ENCOUNTER — Encounter (HOSPITAL_COMMUNITY): Payer: Self-pay

## 2021-05-28 ENCOUNTER — Other Ambulatory Visit: Payer: Self-pay

## 2021-05-28 ENCOUNTER — Emergency Department (HOSPITAL_COMMUNITY)
Admission: EM | Admit: 2021-05-28 | Discharge: 2021-05-29 | Disposition: A | Discharge status: Home / Self Care | Payer: PRIVATE HEALTH INSURANCE | Attending: Emergency Medicine | Admitting: Emergency Medicine

## 2021-05-28 DIAGNOSIS — E039 Hypothyroidism, unspecified: Secondary | ICD-10-CM | POA: Insufficient documentation

## 2021-05-28 DIAGNOSIS — R1013 Epigastric pain: Secondary | ICD-10-CM | POA: Insufficient documentation

## 2021-05-28 DIAGNOSIS — G4489 Other headache syndrome: Secondary | ICD-10-CM

## 2021-05-28 DIAGNOSIS — R0789 Other chest pain: Secondary | ICD-10-CM

## 2021-05-28 DIAGNOSIS — Z79899 Other long term (current) drug therapy: Secondary | ICD-10-CM | POA: Diagnosis not present

## 2021-05-28 DIAGNOSIS — I2510 Atherosclerotic heart disease of native coronary artery without angina pectoris: Secondary | ICD-10-CM | POA: Insufficient documentation

## 2021-05-28 DIAGNOSIS — I10 Essential (primary) hypertension: Secondary | ICD-10-CM | POA: Diagnosis not present

## 2021-05-28 DIAGNOSIS — R072 Precordial pain: Secondary | ICD-10-CM | POA: Diagnosis not present

## 2021-05-28 DIAGNOSIS — R079 Chest pain, unspecified: Secondary | ICD-10-CM | POA: Diagnosis present

## 2021-05-28 NOTE — ED Triage Notes (Signed)
Pt states she has had chest tightness and high blood pressure (195/110) this evening. Pt took ntg x 2 at home, w/o relief. Pt states she has lightheadedness and headache now.

## 2021-05-28 NOTE — ED Provider Notes (Signed)
Emergency Medicine Provider Triage Evaluation Note  [name] , a 55 y.o. female  was evaluated in triage.  Pt complains of chest pain.  States BP has been elevated today around [id number] systolic which is abnormal for her.  Took 2 SL NTG PTA with minimal change in pain.  Has been compliant with her atenolol.  Cardiologist in [place]-- Dr. Minna Merritts.  Review of Systems  Positive: Chest pain, hypertension Negative: SOB  Physical Exam  BP (!) 231/103 (BP Location: Right Arm)   Pulse 63   Temp 98.4 F (36.9 C) (Oral)   Resp 18   LMP 11/17/2017   SpO2 100%   Gen:   Awake, no distress   Resp:  Normal effort  MSK:   Moves extremities without difficulty  Other:    Medical Decision Making  Medically screening exam initiated at 11:28 PM.  Appropriate orders placed.  [name] was informed that the remainder of the evaluation will be completed by another provider, this initial triage assessment does not replace that evaluation, and the importance of remaining in the ED until their evaluation is complete.  BP 231/103 in triage.  Cardiac work-up initiated.  Made acuity 2, will prioritize room assignment.   Larene Pickett, PA-C 05/28/21 [id number]    Veryl Speak, MD 05/29/21 [phone]

## 2021-05-29 ENCOUNTER — Emergency Department (HOSPITAL_COMMUNITY): Payer: PRIVATE HEALTH INSURANCE

## 2021-05-29 LAB — CBC WITH DIFFERENTIAL/PLATELET
Abs Immature Granulocytes: 0.01 10*3/uL (ref 0.00–0.07)
Basophils Absolute: 0 10*3/uL (ref 0.0–0.1)
Basophils Relative: 0 %
Eosinophils Absolute: 0.2 10*3/uL (ref 0.0–0.5)
Eosinophils Relative: 2 %
HCT: 43.1 % (ref 36.0–46.0)
Hemoglobin: 14.2 g/dL (ref 12.0–15.0)
Immature Granulocytes: 0 %
Lymphocytes Relative: 51 %
Lymphs Abs: 4.1 10*3/uL — ABNORMAL HIGH (ref 0.7–4.0)
MCH: 30.9 pg (ref 26.0–34.0)
MCHC: 32.9 g/dL (ref 30.0–36.0)
MCV: 93.7 fL (ref 80.0–100.0)
Monocytes Absolute: 0.5 10*3/uL (ref 0.1–1.0)
Monocytes Relative: 6 %
Neutro Abs: 3.3 10*3/uL (ref 1.7–7.7)
Neutrophils Relative %: 41 %
Platelets: 286 10*3/uL (ref 150–400)
RBC: 4.6 MIL/uL (ref 3.87–5.11)
RDW: 13.3 % (ref 11.5–15.5)
WBC: 8.1 10*3/uL (ref 4.0–10.5)
nRBC: 0 % (ref 0.0–0.2)

## 2021-05-29 LAB — HEPATIC FUNCTION PANEL
ALT: 10 U/L (ref 0–44)
AST: 25 U/L (ref 15–41)
Albumin: 3.9 g/dL (ref 3.5–5.0)
Alkaline Phosphatase: 55 U/L (ref 38–126)
Bilirubin, Direct: 0.6 mg/dL — ABNORMAL HIGH (ref 0.0–0.2)
Indirect Bilirubin: 1 mg/dL — ABNORMAL HIGH (ref 0.3–0.9)
Total Bilirubin: 1.6 mg/dL — ABNORMAL HIGH (ref 0.3–1.2)
Total Protein: 7.4 g/dL (ref 6.5–8.1)

## 2021-05-29 LAB — TROPONIN I (HIGH SENSITIVITY)
Troponin I (High Sensitivity): 6 ng/L (ref <18)
Troponin I (High Sensitivity): 6 ng/L (ref <18)

## 2021-05-29 LAB — BASIC METABOLIC PANEL
Anion gap: 8 (ref 5–15)
BUN: 12 mg/dL (ref 6–20)
CO2: 27 mmol/L (ref 22–32)
Calcium: 9.5 mg/dL (ref 8.9–10.3)
Chloride: 105 mmol/L (ref 98–111)
Creatinine, Ser: 0.8 mg/dL (ref 0.44–1.00)
GFR, Estimated: >60 mL/min (ref >60)
Glucose, Bld: 101 mg/dL — ABNORMAL HIGH (ref 70–99)
Potassium: 4.2 mmol/L (ref 3.5–5.1)
Sodium: 140 mmol/L (ref 135–145)

## 2021-05-29 LAB — LIPASE, BLOOD: Lipase: 31 U/L (ref 11–51)

## 2021-05-29 NOTE — ED Notes (Signed)
Called lab regarding add on for hepatic function panel and lipase.

## 2021-05-29 NOTE — ED Provider Notes (Signed)
[facility] EMERGENCY DEPARTMENT Provider Note   CSN: [id number] Arrival date & time: 05/28/21  2324     History Chief Complaint  Patient presents with   Chest Pain    [name] is a 55 y.o. female.  The history is provided by the patient.  Chest Pain Associated symptoms: headache and shortness of breath   Associated symptoms: no abdominal pain, no fever, no vomiting and no weakness   Patient history of CAD, pancreatitis presents with multiple complaints.  Patient reports that approximately 6 PM on September 2 she began having neck tightness and lightheadedness.  This was after getting home from work.  She reports she checked her blood pressure and it was elevated.  She then began having chest tightness.  She took 2 nitroglycerin without any relief.  She does report her blood pressure is improving.  She has  had mild headache.  No focal arm or leg weakness.  She also  reports mild shortness of breath.  No fevers or vomiting.  Her symptoms are now improving.  She reports med compliance Now reports some mild upper abdominal pain.  Reports previous history of pancreatitis.  Denies alcohol abuse.  Denies any NSAID abuse.    Past Medical History:  Diagnosis Date   Acute pancreatitis 2011   Allergy    Anemia    CAD (coronary artery disease) 2009   Dr Olegario Shearer -[facility], Heart Cath   Family history of adverse reaction to anesthesia    "mother and brother have difficulty waking up out from it"   GERD (gastroesophageal reflux disease)    Hyperlipidemia    Hypertension    Hypothyroidism    "recently dx'd" (12/07/2015)   PVC (premature ventricular contraction)    Statin intolerance    STD (sexually transmitted disease)    CHL 20 yrs ago   Thyroid nodule 2010   "precancerous; thyroid treated with radiation"   Tubular adenoma of colon 06/2015    Patient Active Problem List   Diagnosis Date Noted   Unstable angina ([place]) 07/11/2020   Headache 12/05/2019    Arthralgia 03/19/2019   Right-sided chest wall pain 01/11/2019   Upper respiratory infection 06/08/2018   Hypothyroidism 12/13/2017   Palpitations 12/28/2015   CAD (coronary artery disease), native coronary artery 12/06/2015   Non-cardiac chest pain 12/04/2015   Epigastric abdominal pain 12/04/2015   Bladder pain 05/14/2015   Incomplete bladder emptying 05/14/2015   Occipital neuralgia of left side 11/14/2013   Aspiration into respiratory tract 04/03/2012   Cough due to bronchospasm 04/03/2012   Chest pain, atypical 04/03/2012   HYPERGLYCEMIA 06/16/2010   BACK PAIN, LUMBAR 05/27/2009   THYROID NODULE, RIGHT 05/04/2009   Hyperlipidemia with target LDL less than 70 12/31/2008   H/O acute pancreatitis 12/31/2008   LEG PAIN, BILATERAL 12/31/2008   PARESTHESIA 12/31/2008   EDEMA 12/31/2008   RLQ abdominal pain 10/02/2007   GERD 09/17/2007   DYSPNEA 08/28/2007   Dysphagia 08/28/2007   Essential hypertension 04/24/2007    Past Surgical History:  Procedure Laterality Date   CARDIAC CATHETERIZATION     unable to do cardiac cath but does stress echo 11/22/11   CARDIAC CATHETERIZATION N/A 12/04/2015   Procedure: Left Heart Cath and Coronary Angiography;  Surgeon: Adrian Prows, MD;  Location: [place] [facility];  Service: Cardiovascular;  Laterality: N/A;   CARDIAC CATHETERIZATION N/A 12/04/2015   Procedure: Intravascular Pressure Wire/FFR Study;  Surgeon: Adrian Prows, MD;  Location: [place] [facility];  Service:  Cardiovascular;  Laterality: N/A;   CARDIAC CATHETERIZATION N/A 12/07/2015   Procedure: Coronary Stent Intervention;  Surgeon: Adrian Prows, MD;  Location: [place] [facility];  Service: Cardiovascular;  Laterality: N/A;   COLONOSCOPY     CORONARY STENT PLACEMENT     EYE MUSCLE SURGERY Left 1969   LAPAROSCOPIC CHOLECYSTECTOMY  2008   LEFT HEART CATH AND CORONARY ANGIOGRAPHY N/A 11/24/2017   Procedure: LEFT HEART CATH AND CORONARY ANGIOGRAPHY;  Surgeon: Nigel Mormon, MD;  Location:  [place] [facility];  Service: Cardiovascular;  Laterality: N/A;   POLYPECTOMY     TUBAL LIGATION Bilateral 1995   UPPER GASTROINTESTINAL ENDOSCOPY     VENTRICULAR ABLATION SURGERY  2006,2010   times 2, follow-up for v-tack     OB History     Gravida  4   Para  4   Term  4   Preterm      AB      Living  4      SAB      IAB      Ectopic      Multiple      Live Births  4           Family History  Problem Relation Age of Onset   Hypertension Father    Prostate cancer Father    Heart attack Father    Colon cancer Father 6   Hypertension Mother    Diabetes Mother    Colon polyps Mother    Crohn's disease Daughter    Heart attack Cousin    Diabetes Maternal Grandmother    Stomach cancer Maternal Grandmother    Cancer Sister        ovarian   Colon polyps Sister    Diabetes Brother    Esophageal cancer Neg Hx    Rectal cancer Neg Hx     Social History   Tobacco Use   Smoking status: Never   Smokeless tobacco: Never  Vaping Use   Vaping Use: Never used  Substance Use Topics   Alcohol use: No   Drug use: No    Home Medications Prior to Admission medications   Medication Sig Start Date End Date Taking? Authorizing Provider  atenolol (TENORMIN) 25 MG tablet Take 1 tablet (25 mg total) by mouth daily. -- Office visit needed for further refills Patient taking differently: Take 50 mg by mouth daily. -- Office visit needed for further refills 09/05/18  Yes Plotnikov, Evie Lacks, MD  Cholecalciferol (VITAMIN D) 2000 units tablet Take 2,000 Units by mouth daily.   Yes [provider]  MAGNESIUM PO Take 1 tablet by mouth every other day.   Yes [provider]  nitroGLYCERIN (NITROSTAT) 0.4 MG SL tablet Place 1 tablet (0.4 mg total) under the tongue every 5 (five) minutes as needed for chest pain. 12/08/15  Yes Neldon Labella, NP    Allergies    Crestor [rosuvastatin calcium], Lipitor [atorvastatin], Lisinopril, and Livalo  [pitavastatin]  Review of Systems   Review of Systems  Constitutional:  Negative for fever.  Respiratory:  Positive for shortness of breath.   Cardiovascular:  Positive for chest pain.  Gastrointestinal:  Negative for abdominal pain, blood in stool and vomiting.  Neurological:  Positive for light-headedness and headaches. Negative for syncope and weakness.  All other systems reviewed and are negative.  Physical Exam Updated Vital Signs BP 131/85   Pulse (!) 44   Temp 98.4 F (36.9 C) (Oral)   Resp 17  Ht 1.575 m ('5\' 2"'$ )   Wt 78.9 kg   LMP 11/17/2017   SpO2 98%   BMI 31.83 kg/m   Physical Exam CONSTITUTIONAL: Well developed/well nourished HEAD: Normocephalic/atraumatic EYES: EOMI/PERRL, no nystagmus,   no ptosis ENMT: Mucous membranes moist NECK: supple no meningeal signs, no bruits CV: S1/S2 noted, no murmurs/rubs/gallops noted LUNGS: Lungs are clear to auscultation bilaterally, no apparent distress ABDOMEN: soft, mild epigastric tenderness, no rebound or guarding GU:no cva tenderness NEURO:Awake/alert, face symmetric, no arm or leg drift is noted Equal 5/5 strength with shoulder abduction, elbow flex/extension, wrist flex/extension in upper extremities and equal hand grips bilaterally Equal 5/5 strength with hip flexion,knee flex/extension, foot dorsi/plantar flexion Cranial nerves 3/4/5/6/04/03/09/11/12 tested and intact No past pointing Sensation to light touch intact in all extremities EXTREMITIES: pulses normal, full ROM SKIN: warm, color normal PSYCH: no abnormalities of mood noted  ED Results / Procedures / Treatments   Labs (all labs ordered are listed, but only abnormal results are displayed) Labs Reviewed  CBC WITH DIFFERENTIAL/PLATELET - Abnormal; Notable for the following components:      Result Value   Lymphs Abs 4.1 (*)    All other components within normal limits  BASIC METABOLIC PANEL - Abnormal; Notable for the following components:   Glucose, Bld  101 (*)    All other components within normal limits  HEPATIC FUNCTION PANEL - Abnormal; Notable for the following components:   Total Bilirubin 1.6 (*)    Bilirubin, Direct 0.6 (*)    Indirect Bilirubin 1.0 (*)    All other components within normal limits  LIPASE, BLOOD  TROPONIN I (HIGH SENSITIVITY)  TROPONIN I (HIGH SENSITIVITY)    EKG EKG Interpretation  Date/Time:  Saturday May 29 2021 01:06:31 EDT Ventricular Rate:  48 PR Interval:  218 QRS Duration: 90 QT Interval:  448 QTC Calculation: 400 R Axis:   57 Text Interpretation: Sinus bradycardia with 1st degree A-V block T wave abnormality Abnormal ECG No significant change since last tracing Confirmed by Ripley Fraise [phone]) on 05/29/2021 2:48:20 AM  Radiology DG Chest 2 View  Result Date: 05/29/2021 CLINICAL DATA:  Chest pain, hypertension EXAM: CHEST - 2 VIEW COMPARISON:  07/11/2020 FINDINGS: The heart size and mediastinal contours are within normal limits. Both lungs are clear. The visualized skeletal structures are unremarkable. IMPRESSION: No active cardiopulmonary disease. Electronically Signed   By: Rolm Baptise M.D.   On: 05/29/2021 00:45    Procedures Procedures   Medications Ordered in ED Medications - No data to display  ED Course  I have reviewed the triage vital signs and the nursing notes.  Pertinent labs & imaging results that were available during my care of the patient were reviewed by me and considered in my medical decision making (see chart for details).    MDM Rules/Calculators/A&P                          6:29 AM Patient presented for multiple complaints.  She reports after work her blood pressure became elevated and she had neck tightness, chest tightness headache that is now improving.  She also reports abdominal pain that is improving. Patient denies any focal weakness.  On my exam she had no signs of stroke and had an appropriate mental status Overall work-up was unremarkable.  Vital  signs are improved BP 131/85   Pulse (!) 44   Temp 98.4 F (36.9 C) (Oral)   Resp 17   Ht  1.575 m ('5\' 2"'$ )   Wt 78.9 kg   LMP 11/17/2017   SpO2 98%   BMI 31.83 kg/m  EKG is unchanged from prior.  I feel patient is safe for discharge home.  Suspect most of her symptoms including neck tightness and lightheadedness were related to her elevated blood pressure as patient reports she has had the symptoms previously.  No signs of hypertensive emergency at this time. We discussed strict  return precautions Final Clinical Impression(s) / ED Diagnoses Final diagnoses:  Precordial pain  Primary hypertension  Epigastric pain  Other headache syndrome  Chest tightness    Rx / DC Orders ED Discharge Orders     None        Ripley Fraise, MD 05/29/21 [phone]

## 2021-05-29 NOTE — Discharge Instructions (Addendum)

## 2021-05-29 NOTE — ED Notes (Signed)
Patient ambulated to bathroom with steady gait and minimal assist

## 2021-07-19 ENCOUNTER — Ambulatory Visit: Payer: PRIVATE HEALTH INSURANCE

## 2021-10-04 DIAGNOSIS — Z789 Other specified health status: Secondary | ICD-10-CM | POA: Insufficient documentation

## 2021-10-06 ENCOUNTER — Ambulatory Visit: Payer: PRIVATE HEALTH INSURANCE | Admitting: Gastroenterology

## 2021-10-06 ENCOUNTER — Encounter: Payer: Self-pay | Admitting: Gastroenterology

## 2021-10-06 VITALS — BP 130/70 | HR 53 | Ht 62.0 in | Wt 170.0 lb

## 2021-10-06 DIAGNOSIS — R1013 Epigastric pain: Secondary | ICD-10-CM | POA: Diagnosis not present

## 2021-10-06 DIAGNOSIS — R110 Nausea: Secondary | ICD-10-CM | POA: Insufficient documentation

## 2021-10-06 DIAGNOSIS — K625 Hemorrhage of anus and rectum: Secondary | ICD-10-CM | POA: Diagnosis not present

## 2021-10-06 NOTE — Progress Notes (Signed)
10/06/2021 [name] [id number] 10-Aug-1966   HISTORY OF PRESENT ILLNESS: This is a 56 year old female who is a patient of Dr. Lynne Leader.  She is here today with complaints of epigastric abdominal pain.  Seems mostly in the mid epigastrium, but sometimes more so to the right.  She says it has been going on intermittently for about the past 3 months.  She said the only thing that she can describe it as is similar to what she felt when she had an ulcer in high school.  She said that it seems to hurt when she eats certain foods, but not necessarily just spicy or acidic foods, happens with other foods as well.  Has some nausea, but no vomiting.  She denies NSAID use.  She does not have any pain currently.  She does not have a gallbladder.  She also reported very small amounts of bright red blood with bowel movements on occasion.  She just had a colonoscopy in 2022 that was unremarkable.    Past Medical History:  Diagnosis Date   Acute pancreatitis 2011   Allergy    Anemia    CAD (coronary artery disease) 2009   Dr Olegario Shearer -[facility], Heart Cath   Family history of adverse reaction to anesthesia    "mother and brother have difficulty waking up out from it"   GERD (gastroesophageal reflux disease)    Hyperlipidemia    Hypertension    Hypothyroidism    "recently dx'd" (12/07/2015)   PVC (premature ventricular contraction)    Statin intolerance    STD (sexually transmitted disease)    CHL 20 yrs ago   Thyroid nodule 2010   "precancerous; thyroid treated with radiation"   Tubular adenoma of colon 06/2015   Past Surgical History:  Procedure Laterality Date   CARDIAC CATHETERIZATION     unable to do cardiac cath but does stress echo 11/22/11   CARDIAC CATHETERIZATION N/A 12/04/2015   Procedure: Left Heart Cath and Coronary Angiography;  Surgeon: Adrian Prows, MD;  Location: [place] [facility];  Service: Cardiovascular;  Laterality: N/A;   CARDIAC CATHETERIZATION N/A 12/04/2015    Procedure: Intravascular Pressure Wire/FFR Study;  Surgeon: Adrian Prows, MD;  Location: [place] [facility];  Service: Cardiovascular;  Laterality: N/A;   CARDIAC CATHETERIZATION N/A 12/07/2015   Procedure: Coronary Stent Intervention;  Surgeon: Adrian Prows, MD;  Location: [place] [facility];  Service: Cardiovascular;  Laterality: N/A;   COLONOSCOPY     CORONARY STENT PLACEMENT     EYE MUSCLE SURGERY Left 1969   LAPAROSCOPIC CHOLECYSTECTOMY  2008   LEFT HEART CATH AND CORONARY ANGIOGRAPHY N/A 11/24/2017   Procedure: LEFT HEART CATH AND CORONARY ANGIOGRAPHY;  Surgeon: Nigel Mormon, MD;  Location: [place] [facility];  Service: Cardiovascular;  Laterality: N/A;   POLYPECTOMY     TUBAL LIGATION Bilateral 1995   UPPER GASTROINTESTINAL ENDOSCOPY     VENTRICULAR ABLATION SURGERY  2006,2010   times 2, follow-up for v-tack    reports that she has never smoked. She has never used smokeless tobacco. She reports that she does not drink alcohol and does not use drugs. family history includes Cancer in her sister; Colon cancer (age of onset: 50) in her father; Colon polyps in her mother and sister; Crohn's disease in her daughter; Diabetes in her brother, maternal grandmother, and mother; Heart attack in her cousin and father; Hypertension in her father and mother; Prostate cancer in her father; Stomach cancer in her maternal  grandmother. Allergies  Allergen Reactions   Crestor [Rosuvastatin Calcium] Other (See Comments)    Muscle aches and pains   Lipitor [Atorvastatin] Other (See Comments)    Muscle aches and pains   Lisinopril Cough   Livalo [Pitavastatin]     achy      Outpatient Encounter Medications as of 10/06/2021  Medication Sig   amLODipine (NORVASC) 5 MG tablet Take 5 mg by mouth daily.   atenolol (TENORMIN) 25 MG tablet Take 1 tablet (25 mg total) by mouth daily. -- Office visit needed for further refills   Cholecalciferol (VITAMIN D) 2000 units tablet Take 2,000 Units by mouth daily.    MAGNESIUM PO Take 1 tablet by mouth every other day.   nitroGLYCERIN (NITROSTAT) 0.4 MG SL tablet Place 1 tablet (0.4 mg total) under the tongue every 5 (five) minutes as needed for chest pain.   No facility-administered encounter medications on file as of 10/06/2021.     REVIEW OF SYSTEMS  : All other systems reviewed and negative except where noted in the History of Present Illness.   PHYSICAL EXAM: BP 130/70    Pulse (!) 53    Ht 5\' 2"  (1.575 m)    Wt 170 lb (77.1 kg)    LMP 11/17/2017    BMI 31.09 kg/m  General: Well developed AA female in no acute distress Head: Normocephalic and atraumatic Eyes:  Sclerae anicteric, conjunctiva pink. Ears: Normal auditory acuity Lungs: Clear throughout to auscultation; no W/R/R. Heart: Slightly bradycardic; no M/R/G. Abdomen: Soft, non-distended.  BS present.  Mild epigastric TTP. Musculoskeletal: Symmetrical with no gross deformities  Skin: No lesions on visible extremities Extremities: No edema  Neurological: Alert oriented x 4, grossly non-focal Psychological:  Alert and cooperative. Normal mood and affect  ASSESSMENT AND PLAN: *Epigastric abdominal pain and nausea: Reports having this on and off for about the past 3 months.  Says this feels similar to when she had an ulcer in high school.  She denies any overt heartburn or reflux.  No NSAID use.  Some nausea, but no vomiting.  She had 2 EGDs in the past that only showed fundic gland polyps, but the last was in 2016.  We will plan for EGD with Dr. Fuller Plan to rule out ulcer, esophagitis, etc.  If negative then may want to consider trial of PPI and/or cross-sectional imaging with CT scan of the abdomen.  The risks, benefits, and alternatives to EGD were discussed with the patient and she consents to proceed. *Rectal bleeding: Small-volume bright red blood with bowel movements intermittently.  We discussed this is likely outlet bleeding.  Discussed being sure that she is not straining or having hard  stools, etc.   CC:  Plotnikov, Evie Lacks, MD

## 2021-10-06 NOTE — Patient Instructions (Signed)
You have been scheduled for an endoscopy. Please follow written instructions given to you at your visit today. If you use inhalers (even only as needed), please bring them with you on the day of your procedure.  If you are age 56 or older, your body mass index should be between 23-30. Your Body mass index is 31.09 kg/m. If this is out of the aforementioned range listed, please consider follow up with your Primary Care Provider.  If you are age 61 or younger, your body mass index should be between 19-25. Your Body mass index is 31.09 kg/m. If this is out of the aformentioned range listed, please consider follow up with your Primary Care Provider.   ________________________________________________________  The [facility] providers would like to encourage you to use [facility] to communicate with providers for non-urgent requests or questions.  Due to long hold times on the telephone, sending your provider a message by [facility] may be a faster and more efficient way to get a response.  Please allow 48 business hours for a response.  Please remember that this is for non-urgent requests.  _______________________________________________________

## 2021-10-07 ENCOUNTER — Encounter: Payer: Self-pay | Admitting: Gastroenterology

## 2021-10-12 ENCOUNTER — Ambulatory Visit (AMBULATORY_SURGERY_CENTER): Payer: PRIVATE HEALTH INSURANCE | Admitting: Gastroenterology

## 2021-10-12 ENCOUNTER — Encounter: Payer: Self-pay | Admitting: Gastroenterology

## 2021-10-12 VITALS — BP 158/92 | HR 56 | Temp 97.8°F | Resp 10 | Ht 62.0 in | Wt 170.0 lb

## 2021-10-12 DIAGNOSIS — R1013 Epigastric pain: Secondary | ICD-10-CM

## 2021-10-12 DIAGNOSIS — K319 Disease of stomach and duodenum, unspecified: Secondary | ICD-10-CM

## 2021-10-12 DIAGNOSIS — K317 Polyp of stomach and duodenum: Secondary | ICD-10-CM

## 2021-10-12 MED ORDER — SODIUM CHLORIDE 0.9 % IV SOLN
500.0000 mL | Freq: Once | INTRAVENOUS | Status: DC
Start: 2021-10-12 — End: 2021-10-12

## 2021-10-12 MED ORDER — PANTOPRAZOLE SODIUM 40 MG PO TBEC
40.0000 mg | DELAYED_RELEASE_TABLET | Freq: Every day | ORAL | 11 refills | Status: DC
Start: 2021-10-12 — End: 2022-04-14

## 2021-10-12 NOTE — Progress Notes (Signed)
Pt's states no medical or surgical changes since previsit or office visit. VS assessed by C.W 

## 2021-10-12 NOTE — Op Note (Signed)
[facility] Patient Name: [name] Procedure Date: 10/12/2021 9:29 AM MRN: [id number] Endoscopist: Ladene Artist , MD Age: 56 Referring MD:  Date of Birth: 12-24-1965 Gender: Female Account #: [id number] Procedure:                Upper GI endoscopy Indications:              Epigastric abdominal pain Medicines:                Monitored Anesthesia Care Procedure:                Pre-Anesthesia Assessment:                           - Prior to the procedure, a History and Physical                            was performed, and patient medications and                            allergies were reviewed. The patient's tolerance of                            previous anesthesia was also reviewed. The risks                            and benefits of the procedure and the sedation                            options and risks were discussed with the patient.                            All questions were answered, and informed consent                            was obtained. Prior Anticoagulants: The patient has                            taken no previous anticoagulant or antiplatelet                            agents. ASA Grade Assessment: II - A patient with                            mild systemic disease. After reviewing the risks                            and benefits, the patient was deemed in                            satisfactory condition to undergo the procedure.                           After obtaining informed consent, the endoscope was  passed under direct vision. Throughout the                            procedure, the patient's blood pressure, pulse, and                            oxygen saturations were monitored continuously. The                            GIF [id number] #[id number] was introduced through the                            mouth, and advanced to the second part of duodenum.                            The upper GI endoscopy was  accomplished without                            difficulty. The patient tolerated the procedure                            well. Scope In: Scope Out: Findings:                 The examined esophagus was normal.                           Patchy mildly erythematous mucosa without bleeding                            was found in the gastric antrum. Biopsies were                            taken with a cold forceps for histology.                           Multiple small sessile polyps with no bleeding and                            no stigmata of recent bleeding were found in the                            gastric fundus and in the gastric body. Biopsies                            were taken with a cold forceps for histology.                           The exam of the stomach was otherwise normal.                           A few localized erosions without bleeding were  found in the duodenal bulb.                           A single 6 mm mucosal nodule with a localized                            distribution was found in the duodenal bulb.                            Biopsies were taken with a cold forceps for                            histology.                           The exam of the duodenum was otherwise normal. Complications:            No immediate complications. Estimated Blood Loss:     Estimated blood loss was minimal. Impression:               - Normal esophagus.                           - Erythematous mucosa in the antrum. Biopsied.                           - Multiple gastric polyps. Biopsied.                           - Few duodenal erosions without bleeding.                           - Mucosal nodule found in the duodenal bulb.                            Biopsied. Recommendation:           - Patient has a contact number available for                            emergencies. The signs and symptoms of potential                            delayed  complications were discussed with the                            patient. Return to normal activities tomorrow.                            Written discharge instructions were provided to the                            patient.                           - Resume previous diet.                           -  Continue present medications.                           - Await pathology results.                           - Protonix (pantoprazole) 40 mg PO daily, 1 year of                            refills.                           - Return to GI office in 1 month with me or CKS. Ladene Artist, MD 10/12/2021 9:56:37 AM This report has been signed electronically.

## 2021-10-12 NOTE — Progress Notes (Signed)
Called to room to assist during endoscopic procedure.  Patient ID and intended procedure confirmed with present staff. Received instructions for my participation in the procedure from the performing physician.  

## 2021-10-12 NOTE — Patient Instructions (Signed)
Handout provided on gastritis.   Start Protonix (pantoprazole) 40mg  by mouth daily for acid reduction.   Return to GI office in 1 month. Please call to schedule an appointment.   YOU HAD AN ENDOSCOPIC PROCEDURE TODAY AT [facility]:   Refer to the procedure report that was given to you for any specific questions about what was found during the examination.  If the procedure report does not answer your questions, please call your gastroenterologist to clarify.  If you requested that your care partner not be given the details of your procedure findings, then the procedure report has been included in a sealed envelope for you to review at your convenience later.  YOU SHOULD EXPECT: Some feelings of bloating in the abdomen. Passage of more gas than usual.  Walking can help get rid of the air that was put into your GI tract during the procedure and reduce the bloating. If you had a lower endoscopy (such as a colonoscopy or flexible sigmoidoscopy) you may notice spotting of blood in your stool or on the toilet paper. If you underwent a bowel prep for your procedure, you may not have a normal bowel movement for a few days.  Please Note:  You might notice some irritation and congestion in your nose or some drainage.  This is from the oxygen used during your procedure.  There is no need for concern and it should clear up in a day or so.  SYMPTOMS TO REPORT IMMEDIATELY:  Following upper endoscopy (EGD)  Vomiting of blood or coffee ground material  New chest pain or pain under the shoulder blades  Painful or persistently difficult swallowing  New shortness of breath  Fever of 100F or higher  Black, tarry-looking stools  For urgent or emergent issues, a gastroenterologist can be reached at any hour by calling [phone]. Do not use MyChart messaging for urgent concerns.    DIET:  We do recommend a small meal at first, but then you may proceed to your regular diet.  Drink plenty of  fluids but you should avoid alcoholic beverages for 24 hours.  ACTIVITY:  You should plan to take it easy for the rest of today and you should NOT DRIVE or use heavy machinery until tomorrow (because of the sedation medicines used during the test).    FOLLOW UP: Our staff will call the number listed on your records 48-72 hours following your procedure to check on you and address any questions or concerns that you may have regarding the information given to you following your procedure. If we do not reach you, we will leave a message.  We will attempt to reach you two times.  During this call, we will ask if you have developed any symptoms of COVID 19. If you develop any symptoms (ie: fever, flu-like symptoms, shortness of breath, cough etc.) before then, please call [phone].  If you test positive for Covid 19 in the 2 weeks post procedure, please call and report this information to [place].    If any biopsies were taken you will be contacted by phone or by letter within the next 1-3 weeks.  Please call us at [phone] if you have not heard about the biopsies in 3 weeks.    SIGNATURES/CONFIDENTIALITY: You and/or your care partner have signed paperwork which will be entered into your electronic medical record.  These signatures attest to the fact that that the information above on your After Visit Summary has been reviewed and  is understood.  Full responsibility of the confidentiality of this discharge information lies with you and/or your care-partner.

## 2021-10-12 NOTE — Progress Notes (Signed)
Pt very difficult to wake after anesthesia. She was in PACU for about 20 minutes before she really started to wake up. Pt awake and stable at time of discharge.

## 2021-10-12 NOTE — Progress Notes (Signed)
To pacu, VSS. Report to Rn.tn

## 2021-10-12 NOTE — Progress Notes (Signed)
See 10/06/2021 H&P, no changes.  °

## 2021-10-14 ENCOUNTER — Telehealth: Payer: Self-pay

## 2021-10-14 NOTE — Telephone Encounter (Signed)
First post procedure follow up call, no answer 

## 2021-10-14 NOTE — Telephone Encounter (Signed)
°  Follow up Call-  Call back number 10/12/2021 02/12/2021  Post procedure Call Back phone  # [phone]  Permission to leave phone message Yes Yes  Some recent data might be hidden     Patient questions:  Do you have a fever, pain , or abdominal swelling? No. Pain Score  0 *  Have you tolerated food without any problems? Yes.    Have you been able to return to your normal activities? Yes.    Do you have any questions about your discharge instructions: Diet   No. Medications  No. Follow up visit  No.  Do you have questions or concerns about your Care? No.  Actions: * If pain score is 4 or above: No action needed, pain <4.  Have you developed a fever since your procedure? no  2.   Have you had an respiratory symptoms (SOB or cough) since your procedure? no  3.   Have you tested positive for COVID 19 since your procedure no  4.   Have you had any family members/close contacts diagnosed with the COVID 19 since your procedure?  no   If yes to any of these questions please route to Joylene John, RN and Joella Prince, RN

## 2021-10-20 ENCOUNTER — Encounter: Payer: Self-pay | Admitting: Gastroenterology

## 2022-02-23 ENCOUNTER — Ambulatory Visit: Payer: PRIVATE HEALTH INSURANCE | Admitting: Family Medicine

## 2022-02-23 ENCOUNTER — Encounter: Payer: Self-pay | Admitting: Family Medicine

## 2022-02-23 VITALS — BP 136/84 | HR 60 | Temp 97.5°F | Ht 62.0 in | Wt 178.0 lb

## 2022-02-23 DIAGNOSIS — R109 Unspecified abdominal pain: Secondary | ICD-10-CM

## 2022-02-23 MED ORDER — METHOCARBAMOL 500 MG PO TABS: 500.0000 mg | ORAL_TABLET | Freq: Three times a day (TID) | ORAL | 0 refills | Status: DC | PRN

## 2022-02-23 NOTE — Patient Instructions (Signed)
Your pain currently appears to be related to a musculoskeletal issue. Take Tylenol 1000 mg twice daily.  Use heat or ice to the area.  You can also use a menthol or Salonpas with lidocaine or something else over-the-counter for pain. Take the Robaxin which is a muscle relaxant as needed.  Be aware this medication is sedating and do not drive or operate machinery with this medication. Follow-up if you notice any new or worsening symptoms.

## 2022-02-23 NOTE — Progress Notes (Signed)
Subjective:     Patient ID: [name], female    DOB: [birth year], 56 y.o.   MRN: [id number]  Chief Complaint  Patient presents with   Back Pain    Woke up yesterday with worsening left lower back pain.     HPI Patient is in today for left side pain with movement since yesterday. Pain feels like a spasm. She has nausea with the pain. Denies injury.   Denies fever, chills, dizziness, chest pain, palpitations, shortness of breath, abdominal pain, V/D, urinary symptoms, LE edema.   Using hot compresses. States she took a muscle relaxant around noon yesterday and she fell asleep and woke up with the same pain.       Health Maintenance Due  Topic Date Due   Hepatitis C Screening  Never done   PAP SMEAR-Modifier  08/12/2010   MAMMOGRAM  08/31/2018    Past Medical History:  Diagnosis Date   Acute pancreatitis 2011   Allergy    Anemia    CAD (coronary artery disease) 2009   Dr Olegario Shearer [facility]   Family history of adverse reaction to anesthesia    "mother and brother have difficulty waking up out from it"   GERD (gastroesophageal reflux disease)    Hyperlipidemia    Hypertension    Hypothyroidism    "recently dx'd" (12/07/2015)   PVC (premature ventricular contraction)    Statin intolerance    STD (sexually transmitted disease)    CHL 20 yrs ago   Thyroid nodule 2010   "precancerous; thyroid treated with radiation"   Tubular adenoma of colon 06/2015    Past Surgical History:  Procedure Laterality Date   CARDIAC CATHETERIZATION     unable to do cardiac cath but does stress echo 11/22/11   CARDIAC CATHETERIZATION N/A 12/04/2015   Procedure: Left Heart Cath and Coronary Angiography;  Surgeon: Adrian Prows, MD;  Location: [facility];  Service: Cardiovascular;  Laterality: N/A;   CARDIAC CATHETERIZATION N/A 12/04/2015   Procedure: Intravascular Pressure Wire/FFR Study;  Surgeon: Adrian Prows, MD;  Location: [facility];  Service:  Cardiovascular;  Laterality: N/A;   CARDIAC CATHETERIZATION N/A 12/07/2015   Procedure: Coronary Stent Intervention;  Surgeon: Adrian Prows, MD;  Location: [facility];  Service: Cardiovascular;  Laterality: N/A;   COLONOSCOPY     CORONARY STENT PLACEMENT     EYE MUSCLE SURGERY Left 1969   LAPAROSCOPIC CHOLECYSTECTOMY  2008   LEFT HEART CATH AND CORONARY ANGIOGRAPHY N/A 11/24/2017   Procedure: LEFT HEART CATH AND CORONARY ANGIOGRAPHY;  Surgeon: Nigel Mormon, MD;  Location: [facility];  Service: Cardiovascular;  Laterality: N/A;   POLYPECTOMY     TUBAL LIGATION Bilateral 1995   UPPER GASTROINTESTINAL ENDOSCOPY     VENTRICULAR ABLATION SURGERY  2006,2010   times 2, follow-up for v-tack    Family History  Problem Relation Age of Onset   Hypertension Father    Prostate cancer Father    Heart attack Father    Colon cancer Father 29   Hypertension Mother    Diabetes Mother    Colon polyps Mother    Crohn's disease Daughter    Heart attack Cousin    Diabetes Maternal Grandmother    Stomach cancer Maternal Grandmother    Cancer Sister        ovarian   Colon polyps Sister    Diabetes Brother    Esophageal cancer Neg Hx    Rectal  cancer Neg Hx     Social History   Socioeconomic History   Marital status: Married    Spouse name: Not on file   Number of children: 4   Years of education: Not on file   Highest education level: Not on file  Occupational History   Occupation: Geologist, engineering at [employer]  Tobacco Use   Smoking status: Never   Smokeless tobacco: Never  Vaping Use   Vaping Use: Never used  Substance and Sexual Activity   Alcohol use: No   Drug use: No   Sexual activity: Not Currently    Partners: Male    Birth control/protection: Surgical, Abstinence    Comment: BTL  Other Topics Concern   Not on file  Social History Narrative   FAMILY HISTORY   History of hypertension   History of prostate cancer  1st degree relative <50   D, S ovar. [place]      Regular exercise - YES      GYN Dr Posey Pronto - [place]   Social Determinants of Health   Financial Resource Strain: Not on file  Food Insecurity: Not on file  Transportation Needs: Not on file  Physical Activity: Not on file  Stress: Not on file  Social Connections: Not on file  Intimate Partner Violence: Not on file    Outpatient Medications Prior to Visit  Medication Sig Dispense Refill   amLODipine (NORVASC) 5 MG tablet Take 5 mg by mouth daily.     atenolol (TENORMIN) 25 MG tablet Take 1 tablet (25 mg total) by mouth daily. -- Office visit needed for further refills 90 tablet 0   Cholecalciferol (VITAMIN D) 2000 units tablet Take 2,000 Units by mouth daily.     MAGNESIUM PO Take 1 tablet by mouth every other day.     pantoprazole (PROTONIX) 40 MG tablet Take 1 tablet (40 mg total) by mouth daily. 30 tablet 11   REPATHA SURECLICK 109 MG/ML SOAJ Inject into the skin.     No facility-administered medications prior to visit.    Allergies  Allergen Reactions   Crestor [Rosuvastatin Calcium] Other (See Comments)    Muscle aches and pains   Lipitor [Atorvastatin] Other (See Comments)    Muscle aches and pains   Lisinopril Cough   Livalo [Pitavastatin]     achy    ROS Pertinent positives and negatives in the history of present illness.     Objective:    Physical Exam Constitutional:      General: She is not in acute distress.    Appearance: She is not ill-appearing.  Cardiovascular:     Rate and Rhythm: Normal rate and regular rhythm.     Pulses: Normal pulses.  Pulmonary:     Effort: Pulmonary effort is normal.     Breath sounds: Normal breath sounds.  Abdominal:     General: Bowel sounds are normal.     Palpations: Abdomen is soft.     Tenderness: There is no abdominal tenderness. There is no right CVA tenderness or left CVA tenderness.  Musculoskeletal:     Cervical back: Normal, normal range of motion and neck supple.      Thoracic back: Normal.     Comments: Tenderness to light palpation along her left side from her left anterior lower ribs to left flank and down to her left hip. Pain reproduced with spinal rotation and left lateral bending.   Skin:    General: Skin is  warm and dry.     Capillary Refill: Capillary refill takes less than 2 seconds.     Findings: No bruising or rash.  Neurological:     General: No focal deficit present.     Mental Status: She is alert and oriented to person, place, and time.     Sensory: No sensory deficit.     Motor: No weakness.     Coordination: Coordination normal.     Gait: Gait normal.  Psychiatric:        Mood and Affect: Mood normal.        Behavior: Behavior normal.    BP 136/84 (BP Location: Left Arm, Patient Position: Sitting, Cuff Size: Large)   Pulse 60   Temp (!) 97.5 F (36.4 C) (Temporal)   Ht '5\' 2"'$  (1.575 m)   Wt 178 lb (80.7 kg)   LMP 11/17/2017   SpO2 98%   BMI 32.56 kg/m  Wt Readings from Last 3 Encounters:  02/23/22 178 lb (80.7 kg)  10/12/21 170 lb (77.1 kg)  10/06/21 170 lb (77.1 kg)       Assessment & Plan:   Problem List Items Addressed This Visit   None Visit Diagnoses     Side pain    -  Primary   Relevant Medications   methocarbamol (ROBAXIN) 500 MG tablet      No red flag symptoms. Pain appears to be related to MSK etiology. Discussed conservative treatment with heat or ice, Tylenol, topical analgesic, and Robaxin prescribed to use prn. Advised that the medication is sedating. Follow up if any new or worsening symptoms.   I am having [name] start on methocarbamol. I am also having her maintain her Vitamin D, atenolol, MAGNESIUM PO, amLODipine, pantoprazole, and Repatha SureClick.  Meds ordered this encounter  Medications   methocarbamol (ROBAXIN) 500 MG tablet    Sig: Take 1 tablet (500 mg total) by mouth every 8 (eight) hours as needed for muscle spasms.    Dispense:  30 tablet    Refill:  0    Order  Specific Question:   Supervising Provider    Answer:   Pricilla Holm A [[id number]]

## 2022-02-28 ENCOUNTER — Telehealth: Payer: Self-pay

## 2022-02-28 NOTE — Telephone Encounter (Signed)
Pt is requesting a work note for 5/31-6/2.  WORK fax [phone] Etta Grandchild

## 2022-02-28 NOTE — Telephone Encounter (Signed)
[place] for work note to excuse pt from 5/31-6/2?

## 2022-03-01 NOTE — Telephone Encounter (Signed)
Attempting to fax over work note but each time I receive a "no response." I have sent pt a MyChart message to see if I can try another fax number

## 2022-03-03 NOTE — Telephone Encounter (Signed)
Called pt and she requested we send through MyChart instead. Sent work note through [employer].

## 2022-03-23 ENCOUNTER — Ambulatory Visit (INDEPENDENT_AMBULATORY_CARE_PROVIDER_SITE_OTHER): Payer: PRIVATE HEALTH INSURANCE | Admitting: Podiatry

## 2022-03-23 ENCOUNTER — Ambulatory Visit (INDEPENDENT_AMBULATORY_CARE_PROVIDER_SITE_OTHER): Payer: PRIVATE HEALTH INSURANCE

## 2022-03-23 DIAGNOSIS — M79672 Pain in left foot: Secondary | ICD-10-CM

## 2022-03-23 DIAGNOSIS — M19072 Primary osteoarthritis, left ankle and foot: Secondary | ICD-10-CM | POA: Diagnosis not present

## 2022-03-23 DIAGNOSIS — M1990 Unspecified osteoarthritis, unspecified site: Secondary | ICD-10-CM | POA: Diagnosis not present

## 2022-03-23 DIAGNOSIS — M19079 Primary osteoarthritis, unspecified ankle and foot: Secondary | ICD-10-CM

## 2022-03-25 ENCOUNTER — Other Ambulatory Visit: Payer: Self-pay | Admitting: Podiatry

## 2022-03-25 DIAGNOSIS — M1990 Unspecified osteoarthritis, unspecified site: Secondary | ICD-10-CM

## 2022-03-30 NOTE — Progress Notes (Signed)
Subjective:  Patient ID: [name], female    DOB: Feb 20, 1966,  MRN: [id number]  No chief complaint on file.   56 y.o. female presents with the above complaint.  Patient presents with complaint of left lateral midfoot pain.  She states going on for quite some time is progressive gotten worse hurts with ambulation hurts with pressure.  She went to get it evaluated she does not think she broke it she denies any history of trauma pain scale 7 out of 10.   Review of Systems: Negative except as noted in the HPI. Denies N/V/F/Ch.  Past Medical History:  Diagnosis Date   Acute pancreatitis 2011   Allergy    Anemia    CAD (coronary artery disease) 2009   Dr Olegario Shearer -[facility]   Family history of adverse reaction to anesthesia    "mother and brother have difficulty waking up out from it"   GERD (gastroesophageal reflux disease)    Hyperlipidemia    Hypertension    Hypothyroidism    "recently dx'd" (12/07/2015)   PVC (premature ventricular contraction)    Statin intolerance    STD (sexually transmitted disease)    CHL 20 yrs ago   Thyroid nodule 2010   "precancerous; thyroid treated with radiation"   Tubular adenoma of colon 06/2015    Current Outpatient Medications:    amLODipine (NORVASC) 5 MG tablet, Take 5 mg by mouth daily., Disp: , Rfl:    atenolol (TENORMIN) 25 MG tablet, Take 1 tablet (25 mg total) by mouth daily. -- Office visit needed for further refills, Disp: 90 tablet, Rfl: 0   Cholecalciferol (VITAMIN D) 2000 units tablet, Take 2,000 Units by mouth daily., Disp: , Rfl:    MAGNESIUM PO, Take 1 tablet by mouth every other day., Disp: , Rfl:    methocarbamol (ROBAXIN) 500 MG tablet, Take 1 tablet (500 mg total) by mouth every 8 (eight) hours as needed for muscle spasms., Disp: 30 tablet, Rfl: 0   pantoprazole (PROTONIX) 40 MG tablet, Take 1 tablet (40 mg total) by mouth daily., Disp: 30 tablet, Rfl: 11   REPATHA SURECLICK 564 MG/ML SOAJ, Inject into the  skin., Disp: , Rfl:   Social History   Tobacco Use  Smoking Status Never  Smokeless Tobacco Never    Allergies  Allergen Reactions   Crestor [Rosuvastatin Calcium] Other (See Comments)    Muscle aches and pains   Lipitor [Atorvastatin] Other (See Comments)    Muscle aches and pains   Lisinopril Cough   Livalo [Pitavastatin]     achy   Objective:  There were no vitals filed for this visit. There is no height or weight on file to calculate BMI. Constitutional Well developed. Well nourished.  Vascular Dorsalis pedis pulses palpable bilaterally. Posterior tibial pulses palpable bilaterally. Capillary refill normal to all digits.  No cyanosis or clubbing noted. Pedal hair growth normal.  Neurologic Normal speech. Oriented to person, place, and time. Epicritic sensation to light touch grossly present bilaterally.  Dermatologic Nails well groomed and normal in appearance. No open wounds. No skin lesions.  Orthopedic: Pain on palpation to the left lateral midfoot.  No pain with range of motion of the tarsometatarsal joints.  No extensor or flexor tendinitis noted.  No pain at the ankle.   Radiographs: 3 views of skeletally mature adult left foot: Mild midfoot arthritis noted.  No bunion deformity noted.  Pes planovalgus foot structure noted.  No plantar or posterior heel spurring noted. Assessment:  1. Left foot pain    Plan:  Patient was evaluated and treated and all questions answered.  Left lateral midfoot capsulitis/arthritis -All questions and concerns were discussed with the patient extensive detail given the amount of pain that she is having she will benefit from steroid injection help decrease acute inflammatory component associate with pain.  Patient agrees with plan like to proceed with steroid injection -A steroid injection was performed at left midfoot at point of maximal tenderness using 1% plain Lidocaine and 10 mg of Kenalog. This was well tolerated.   No  follow-ups on file.

## 2022-04-14 ENCOUNTER — Ambulatory Visit: Payer: PRIVATE HEALTH INSURANCE | Admitting: Nurse Practitioner

## 2022-04-14 VITALS — BP 118/70 | HR 63 | Temp 98.2°F | Ht 62.0 in | Wt 178.0 lb

## 2022-04-14 DIAGNOSIS — H00024 Hordeolum internum left upper eyelid: Secondary | ICD-10-CM

## 2022-04-14 MED ORDER — ERYTHROMYCIN 5 MG/GM OP OINT: 1.0000 | TOPICAL_OINTMENT | Freq: Three times a day (TID) | OPHTHALMIC | 0 refills | Status: DC

## 2022-04-14 NOTE — Progress Notes (Signed)
Established Patient Office Visit  Subjective   Patient ID: [name], female    DOB: 06-24-1966  Age: 56 y.o. MRN: [id number]  Chief Complaint  Patient presents with   lump on left eyelid    With irritation    Patient comes in today for the above.  She reports that she has been experiencing a mass to her left upper eyelid that been present for 1 month and has been getting progressively bigger.  She has used hot compress but symptoms have remained present.  She reports tenderness to palpation but no pain otherwise.  She denies visual changes, discharge, redness to the eyeball, or pain to the eye.    Review of Systems  Eyes:  Positive for photophobia. Negative for blurred vision and pain.      Objective:     BP 118/70 (BP Location: Left Arm, Patient Position: Sitting, Cuff Size: Large)   Pulse 63   Temp 98.2 F (36.8 C) (Oral)   Ht '5\' 2"'$  (1.575 m)   Wt 178 lb (80.7 kg)   LMP 11/17/2017   SpO2 96%   BMI 32.56 kg/m    Physical Exam Vitals reviewed.  Constitutional:      General: She is not in acute distress.    Appearance: Normal appearance.  HENT:     Head: Normocephalic and atraumatic.  Eyes:     General: Vision grossly intact. Gaze aligned appropriately.        Left eye: Hordeolum present.    Conjunctiva/sclera: Conjunctivae normal.     Right eye: Right conjunctiva is not injected. No exudate or hemorrhage.    Left eye: Left conjunctiva is not injected. No exudate or hemorrhage. Neck:     Vascular: No carotid bruit.  Cardiovascular:     Rate and Rhythm: Normal rate and regular rhythm.     Pulses: Normal pulses.     Heart sounds: Normal heart sounds.  Pulmonary:     Effort: Pulmonary effort is normal.     Breath sounds: Normal breath sounds.  Skin:    General: Skin is warm and dry.  Neurological:     General: No focal deficit present.     Mental Status: She is alert and oriented to person, place, and time.  Psychiatric:        Mood and Affect:  Mood normal.        Behavior: Behavior normal.        Judgment: Judgment normal.      No results found for any visits on 04/14/22.    The 10-year ASCVD risk score (Arnett DK, et al., 2019) is: 4.2%    Assessment & Plan:  Lesion on upper left eyelid appears to be either internal stye or to chalazion.  No evidence of cellulitis. No superficial lesion noted to the surrounding skin to suggest molluscum or seborrheic keratosis. Due to patient's experience of tenderness will treat with erythromycin ointment 3 times a day.  We will also refer to ophthalmology for further evaluation as lesion does not present as classic hordeolum.  Patient was encouraged to continue using warm compress and to clean eye twice a day with gentle nonirritating soap.  She reports her understanding.  She was warned that if she experiences any visual changes, redness to the eyeball, or worsening pain to the eye she needs to proceed to the emergency department.  She reports her understanding. Problem List Items Addressed This Visit   None Visit Diagnoses     Hordeolum  internum of left upper eyelid    -  Primary   Relevant Medications   erythromycin ophthalmic ointment   Other Relevant Orders   Ambulatory referral to Ophthalmology       Return if symptoms worsen or fail to improve.    Ailene Ards, NP

## 2022-04-15 ENCOUNTER — Telehealth: Payer: Self-pay | Admitting: Internal Medicine

## 2022-04-15 NOTE — Telephone Encounter (Signed)
A rep with [facility] calls today and informs [place] that the Erythromycin eye ointment is actually on back order. Is there anything you would recommend as a substitution?   CB: [phone]

## 2022-04-18 ENCOUNTER — Other Ambulatory Visit: Payer: Self-pay

## 2022-04-18 ENCOUNTER — Telehealth: Payer: Self-pay | Admitting: Internal Medicine

## 2022-04-18 MED ORDER — BACITRACIN-POLYMYXIN B 500-10000 UNIT/GM OP OINT: 1.0000 | TOPICAL_OINTMENT | Freq: Two times a day (BID) | OPHTHALMIC | 0 refills | Status: DC

## 2022-04-18 NOTE — Telephone Encounter (Signed)
Pt called in and states the pharmacy in which her Erthromycin Ointment was sent states it is on backorder.   Please try to send to:  [facility] [place], [address] AT [address] Phone:  [phone]  Fax:  [phone]      Pt states in the mean time, she will call around to other pharmacies just in case they dont have it.

## 2022-04-18 NOTE — Telephone Encounter (Signed)
Substitution sent. 

## 2022-04-18 NOTE — Telephone Encounter (Signed)
MD is out of the office this week. Pls advise...[name]

## 2022-04-18 NOTE — Telephone Encounter (Signed)
Med has been sent

## 2022-04-20 ENCOUNTER — Ambulatory Visit (INDEPENDENT_AMBULATORY_CARE_PROVIDER_SITE_OTHER): Payer: PRIVATE HEALTH INSURANCE | Admitting: Podiatry

## 2022-04-20 DIAGNOSIS — M19079 Primary osteoarthritis, unspecified ankle and foot: Secondary | ICD-10-CM | POA: Diagnosis not present

## 2022-04-20 DIAGNOSIS — M19071 Primary osteoarthritis, right ankle and foot: Secondary | ICD-10-CM

## 2022-04-20 DIAGNOSIS — M778 Other enthesopathies, not elsewhere classified: Secondary | ICD-10-CM | POA: Diagnosis not present

## 2022-04-26 NOTE — Progress Notes (Signed)
Subjective:  Patient ID: [name], female    DOB: March 07, 1966,  MRN: [id number]  Chief Complaint  Patient presents with   Arthritis    56 y.o. female presents with the above complaint patient presents with far left lateral midfoot pain.  She states the injection helped considerably she had her pain she still has some residual pain but overall helped a lot.  She denies any other acute complaint she would like to know if she can do another steroid injection.   Review of Systems: Negative except as noted in the HPI. Denies N/V/F/Ch.  Past Medical History:  Diagnosis Date   Acute pancreatitis 2011   Allergy    Anemia    CAD (coronary artery disease) 2009   Dr Olegario Shearer [facility]   Family history of adverse reaction to anesthesia    "mother and brother have difficulty waking up out from it"   GERD (gastroesophageal reflux disease)    Hyperlipidemia    Hypertension    Hypothyroidism    "recently dx'd" (12/07/2015)   PVC (premature ventricular contraction)    Statin intolerance    STD (sexually transmitted disease)    CHL 20 yrs ago   Thyroid nodule 2010   "precancerous; thyroid treated with radiation"   Tubular adenoma of colon 06/2015    Current Outpatient Medications:    amLODipine (NORVASC) 5 MG tablet, Take 5 mg by mouth daily., Disp: , Rfl:    atenolol (TENORMIN) 25 MG tablet, Take 1 tablet (25 mg total) by mouth daily. -- Office visit needed for further refills, Disp: 90 tablet, Rfl: 0   bacitracin-polymyxin b (POLYSPORIN) ophthalmic ointment, Place 1 Application into the left eye every 12 (twelve) hours. apply to eye every 12 hours while awake, Disp: 3.5 g, Rfl: 0   Cholecalciferol (VITAMIN D) 2000 units tablet, Take 2,000 Units by mouth daily., Disp: , Rfl:    MAGNESIUM PO, Take 1 tablet by mouth every other day., Disp: , Rfl:   Social History   Tobacco Use  Smoking Status Never  Smokeless Tobacco Never    Allergies  Allergen Reactions    Crestor [Rosuvastatin Calcium] Other (See Comments)    Muscle aches and pains   Lipitor [Atorvastatin] Other (See Comments)    Muscle aches and pains   Lisinopril Cough   Livalo [Pitavastatin]     achy   Objective:  There were no vitals filed for this visit. There is no height or weight on file to calculate BMI. Constitutional Well developed. Well nourished.  Vascular Dorsalis pedis pulses palpable bilaterally. Posterior tibial pulses palpable bilaterally. Capillary refill normal to all digits.  No cyanosis or clubbing noted. Pedal hair growth normal.  Neurologic Normal speech. Oriented to person, place, and time. Epicritic sensation to light touch grossly present bilaterally.  Dermatologic Nails well groomed and normal in appearance. No open wounds. No skin lesions.  Orthopedic: Pain on palpation to the left lateral midfoot.  No pain with range of motion of the tarsometatarsal joints.  No extensor or flexor tendinitis noted.  No pain at the ankle.   Radiographs: 3 views of skeletally mature adult left foot: Mild midfoot arthritis noted.  No bunion deformity noted.  Pes planovalgus foot structure noted.  No plantar or posterior heel spurring noted. Assessment:   1. Arthritis of midfoot   2. Capsulitis of left foot     Plan:  Patient was evaluated and treated and all questions answered.  Left lateral midfoot capsulitis/arthritis -All questions and  concerns were discussed with the patient extensive detail given the amount of pain that she is having she will benefit from steroid injection help decrease acute inflammatory component associate with pain.  Patient agrees with plan like to proceed with steroid injection -A second steroid injection was performed at left midfoot at point of maximal tenderness using 1% plain Lidocaine and 10 mg of Kenalog. This was well tolerated. -I discussed shoe gear modification extensive detail as well.  She states understanding will work on   No  follow-ups on file.

## 2022-06-16 ENCOUNTER — Observation Stay (HOSPITAL_COMMUNITY)
Admission: EM | Admit: 2022-06-16 | Discharge: 2022-06-18 | Disposition: A | Discharge status: Home / Self Care | Payer: PRIVATE HEALTH INSURANCE | Attending: Internal Medicine | Admitting: Internal Medicine

## 2022-06-16 ENCOUNTER — Encounter (HOSPITAL_COMMUNITY): Payer: Self-pay

## 2022-06-16 ENCOUNTER — Emergency Department (HOSPITAL_COMMUNITY): Payer: PRIVATE HEALTH INSURANCE

## 2022-06-16 ENCOUNTER — Other Ambulatory Visit: Payer: Self-pay

## 2022-06-16 DIAGNOSIS — R0789 Other chest pain: Principal | ICD-10-CM | POA: Insufficient documentation

## 2022-06-16 DIAGNOSIS — R001 Bradycardia, unspecified: Secondary | ICD-10-CM

## 2022-06-16 DIAGNOSIS — I2510 Atherosclerotic heart disease of native coronary artery without angina pectoris: Secondary | ICD-10-CM | POA: Diagnosis not present

## 2022-06-16 DIAGNOSIS — R079 Chest pain, unspecified: Secondary | ICD-10-CM

## 2022-06-16 DIAGNOSIS — I10 Essential (primary) hypertension: Secondary | ICD-10-CM | POA: Diagnosis present

## 2022-06-16 DIAGNOSIS — Z79899 Other long term (current) drug therapy: Secondary | ICD-10-CM | POA: Insufficient documentation

## 2022-06-16 DIAGNOSIS — E890 Postprocedural hypothyroidism: Secondary | ICD-10-CM | POA: Diagnosis not present

## 2022-06-16 DIAGNOSIS — E039 Hypothyroidism, unspecified: Secondary | ICD-10-CM | POA: Diagnosis not present

## 2022-06-16 DIAGNOSIS — M546 Pain in thoracic spine: Secondary | ICD-10-CM | POA: Diagnosis present

## 2022-06-16 DIAGNOSIS — Z952 Presence of prosthetic heart valve: Secondary | ICD-10-CM | POA: Diagnosis not present

## 2022-06-16 DIAGNOSIS — E785 Hyperlipidemia, unspecified: Secondary | ICD-10-CM | POA: Diagnosis present

## 2022-06-16 LAB — BASIC METABOLIC PANEL
Anion gap: 11 (ref 5–15)
BUN: 18 mg/dL (ref 6–20)
CO2: 24 mmol/L (ref 22–32)
Calcium: 9.9 mg/dL (ref 8.9–10.3)
Chloride: 107 mmol/L (ref 98–111)
Creatinine, Ser: 0.87 mg/dL (ref 0.44–1.00)
GFR, Estimated: >60 mL/min (ref >60)
Glucose, Bld: 86 mg/dL (ref 70–99)
Potassium: 4.3 mmol/L (ref 3.5–5.1)
Sodium: 142 mmol/L (ref 135–145)

## 2022-06-16 LAB — TROPONIN I (HIGH SENSITIVITY)
Troponin I (High Sensitivity): 6 ng/L (ref <18)
Troponin I (High Sensitivity): 4 ng/L (ref <18)

## 2022-06-16 LAB — CBC
HCT: 42.6 % (ref 36.0–46.0)
Hemoglobin: 13.9 g/dL (ref 12.0–15.0)
MCH: 30.8 pg (ref 26.0–34.0)
MCHC: 32.6 g/dL (ref 30.0–36.0)
MCV: 94.2 fL (ref 80.0–100.0)
Platelets: 270 10*3/uL (ref 150–400)
RBC: 4.52 MIL/uL (ref 3.87–5.11)
RDW: 13.2 % (ref 11.5–15.5)
WBC: 6.8 10*3/uL (ref 4.0–10.5)
nRBC: 0 % (ref 0.0–0.2)

## 2022-06-16 MED ORDER — NITROGLYCERIN 0.4 MG SL SUBL
0.4000 mg | SUBLINGUAL_TABLET | SUBLINGUAL | Status: DC | PRN
  Administered 2022-06-16: 0.4 mg via SUBLINGUAL
  Filled 2022-06-16: qty 1

## 2022-06-16 MED ORDER — ASPIRIN 325 MG PO TABS
325.0000 mg | ORAL_TABLET | Freq: Every day | ORAL | Status: DC
  Administered 2022-06-16 – 2022-06-18 (×2): 325 mg via ORAL
  Filled 2022-06-16 (×3): qty 1

## 2022-06-16 MED ORDER — KETOROLAC TROMETHAMINE 15 MG/ML IJ SOLN
15.0000 mg | Freq: Once | INTRAMUSCULAR | Status: AC
  Administered 2022-06-16: 15 mg via INTRAVENOUS
  Filled 2022-06-16: qty 1

## 2022-06-16 NOTE — ED Provider Notes (Signed)
[facility] EMERGENCY DEPARTMENT Provider Note   CSN: [id number] Arrival date & time: 06/16/22  1535     History  Chief Complaint  Patient presents with   Chest Pain    [name] is a 56 y.o. female. Presenting with chest pain that started at approximately 1300 today at work while at rest. Located on left side and radiating to left jaw. Initially sharp and severe. It has gradually become less sharp and now described as chest tightness. She denies shortness of breath, fever, cough, nausea, or abdominal pain. Stats it feels similar to prior MI, but not quite as severe.   Chest Pain Associated symptoms: no abdominal pain, no back pain, no cough, no fever, no palpitations, no shortness of breath and no vomiting        Home Medications Prior to Admission medications   Medication Sig Start Date End Date Taking? Authorizing Provider  amLODipine (NORVASC) 5 MG tablet Take 5 mg by mouth daily. 09/28/21   [provider]  atenolol (TENORMIN) 25 MG tablet Take 1 tablet (25 mg total) by mouth daily. -- Office visit needed for further refills 09/05/18   Plotnikov, Evie Lacks, MD  bacitracin-polymyxin b (POLYSPORIN) ophthalmic ointment Place 1 Application into the left eye every 12 (twelve) hours. apply to eye every 12 hours while awake 04/18/22   Hoyt Koch, MD  Cholecalciferol (VITAMIN D) 2000 units tablet Take 2,000 Units by mouth daily.    [provider]  MAGNESIUM PO Take 1 tablet by mouth every other day.    [provider]      Allergies    Crestor [rosuvastatin calcium], Lipitor [atorvastatin], Lisinopril, and Livalo [pitavastatin]    Review of Systems   Review of Systems  Constitutional:  Negative for chills and fever.  HENT:  Negative for ear pain and sore throat.   Eyes:  Negative for pain and visual disturbance.  Respiratory:  Negative for cough and shortness of breath.   Cardiovascular:  Positive for chest pain.  Negative for palpitations.  Gastrointestinal:  Negative for abdominal pain and vomiting.  Genitourinary:  Negative for dysuria and hematuria.  Musculoskeletal:  Negative for arthralgias and back pain.  Skin:  Negative for color change and rash.  Neurological:  Negative for seizures and syncope.  All other systems reviewed and are negative.   Physical Exam Updated Vital Signs BP 117/73   Pulse (!) 46   Temp 97.8 F (36.6 C) (Oral)   Resp 13   Ht '5\' 2"'$  (1.575 m)   Wt 80.7 kg   LMP 11/17/2017   SpO2 100%   BMI 32.56 kg/m  Physical Exam Vitals and nursing note reviewed.  Constitutional:      General: She is not in acute distress.    Appearance: She is well-developed.  HENT:     Head: Normocephalic and atraumatic.  Eyes:     Conjunctiva/sclera: Conjunctivae normal.  Cardiovascular:     Rate and Rhythm: Normal rate and regular rhythm.     Heart sounds: No murmur heard. Pulmonary:     Effort: Pulmonary effort is normal. No respiratory distress.     Breath sounds: Normal breath sounds.  Abdominal:     Palpations: Abdomen is soft.     Tenderness: There is no abdominal tenderness.  Musculoskeletal:        General: No swelling.     Cervical back: Neck supple.     Right lower leg: No edema.  Left lower leg: No edema.  Skin:    General: Skin is warm and dry.     Capillary Refill: Capillary refill takes less than 2 seconds.  Neurological:     Mental Status: She is alert.  Psychiatric:        Mood and Affect: Mood normal.     ED Results / Procedures / Treatments   Labs (all labs ordered are listed, but only abnormal results are displayed) Labs Reviewed  BASIC METABOLIC PANEL  CBC  TROPONIN I (HIGH SENSITIVITY)  TROPONIN I (HIGH SENSITIVITY)    EKG None  Radiology DG Chest 2 View  Result Date: 06/16/2022 CLINICAL DATA:  Chest pain radiating to neck, shortness of breath EXAM: CHEST - 2 VIEW COMPARISON:  05/29/2021 FINDINGS: Frontal and lateral views of the  chest demonstrate an unremarkable cardiac silhouette. No airspace disease, effusion, or pneumothorax. No acute bony abnormalities. IMPRESSION: 1. No acute intrathoracic process. Electronically Signed   By: Randa Ngo M.D.   On: 06/16/2022 17:17    Procedures Procedures    Medications Ordered in ED Medications  aspirin tablet 325 mg (325 mg Oral Given 06/16/22 1852)  nitroGLYCERIN (NITROSTAT) SL tablet 0.4 mg (0.4 mg Sublingual Given 06/16/22 2043)  ketorolac (TORADOL) 15 MG/ML injection 15 mg (15 mg Intravenous Given 06/16/22 2255)    ED Course/ Medical Decision Making/ A&P                           Medical Decision Making Amount and/or Complexity of Data Reviewed Labs: ordered. Radiology: ordered.  Risk OTC drugs. Prescription drug management. Decision regarding hospitalization.   56 year old female with past medical history of CAD s/p drug-eluting stent in 2017 presenting with left-sided chest pain that started at approximately 1300 today. On arrival, patient reports continued chest pain.  She is stable vital signs.  No medications were given prior to arrival. Aspirin 325 mg given.  Differential diagnosis ACS, arrhythmia, muscle strain, PE, pneumothorax, pneumonia. Low suspicion for PE as patient does not have a prior history, it is neither tachypneic, nor hypoxic, nor tachycardic.  She also denies shortness of breath.  EKG showed sinus bradycardia, rate 56.  Normal axis and intervals.  No ST elevations or depressions.  Overall appears similar to prior.  Labs overall reassuring.  No leukocytosis, anemia, AKI, or significant electrolyte abnormalities.  Troponin is reassuring at 6, second troponin at 4. Chest x-ray reassuring.  No signs of focal opacity/pneumonia, pneumothorax, or pulmonary edema.  Overall, concern for high risk chest pain.  She has a history of a prior mid LAD DES placed 2017.  Her last LHC in 2019 showed patent stent.  She is not currently on any  anticoagulation, due to difficulty tolerating the past. Patient admitted to the hospital service.        Final Clinical Impression(s) / ED Diagnoses Final diagnoses:  Chest pain, unspecified type    Rx / DC Orders ED Discharge Orders     None         Rosine Abe, MD 06/16/22 2344    Lacretia Leigh, MD 06/20/22 2122

## 2022-06-16 NOTE — ED Triage Notes (Signed)
Complains of chest pain and sob that started around 1300 radiating into her neck.  Initermittent pain.

## 2022-06-16 NOTE — ED Provider Triage Note (Signed)
Emergency Medicine Provider Triage Evaluation Note  [name] , a 56 y.o. female  was evaluated in triage.  Pt complains of sharp intermittent left-sided chest pain onset 1 PM today.  Notes that it radiates to her left-sided neck and shoulder.  Has associated shortness of breath.  Rates her pain a 7/10 at this time.  Has associated nausea.  Denies vomiting, diaphoresis.  Has a history of hypertension is compliant with her medications.  Denies history of MI.  History of stent in 2017.Marland Kitchen  Review of Systems  Positive: Negative:   Physical Exam  BP (!) 136/90   Pulse 60   Temp 97.6 F (36.4 C) (Oral)   Resp 18   Ht '5\' 2"'$  (1.575 m)   Wt 80.7 kg   LMP 11/17/2017   SpO2 100%   BMI 32.56 kg/m  Gen:   Awake, no distress   Resp:  Normal effort  MSK:   Moves extremities without difficulty  Other:  No chest wall tenderness to palpation  Medical Decision Making  Medically screening exam initiated at 4:41 PM.  Appropriate orders placed.  [name] was informed that the remainder of the evaluation will be completed by another provider, this initial triage assessment does not replace that evaluation, and the importance of remaining in the ED until their evaluation is complete.  Work-up initiated   [name] A, PA-C 06/16/22 1641

## 2022-06-16 NOTE — H&P (Signed)
History and Physical    Patient: [name] TOI:[id number] DOB: May 26, 1966 DOA: 06/16/2022 DOS: the patient was seen and examined on 06/17/2022 PCP: Cassandria Anger, MD  Patient coming from: Home  Chief Complaint:  Chief Complaint  Patient presents with   Chest Pain   HPI: [name] is a 56 y.o. female with medical history significant of CAD s/p stent, hypertension, statin intolerance, hypothyroidism, obesity , PVCs, symptomatic bradycardia who presents with chest pain and increasing shortness of breath.  Patient has intermittent chest pain at rest for the past 2 months after being taken off Repatha for intolerance of side effects.  Also has intolerance of statin.  She reports acute sharp left-sided chest pain rating to her neck today while sitting at work looking at paperwork.  Felt diaphoretic and nauseous.  Symptoms lasted about 15 minutes but currently continues to have a dull pressure chest pain.  She has been given 2 sublingual nitros in the ED with transient improvement.  In the ED, she was bradycardic with heart rate in the 40s, normotensive.  CBC and BMP unremarkable. Troponin of 4.  Chest x-ray is negative.  EKG with sinus bradycardia and first-degree AV block.  She was given 325 aspirin, 15 mg of IV Toradol in the ED.  Hospitalist then consulted for admission.  Review of Systems: As mentioned in the history of present illness. All other systems reviewed and are negative. Past Medical History:  Diagnosis Date   Acute pancreatitis 2011   Allergy    Anemia    CAD (coronary artery disease) 2009   Dr Olegario Shearer [facility]   Family history of adverse reaction to anesthesia    "mother and brother have difficulty waking up out from it"   GERD (gastroesophageal reflux disease)    Hyperlipidemia    Hypertension    Hypothyroidism    "recently dx'd" (12/07/2015)   PVC (premature ventricular contraction)    Statin intolerance    STD (sexually  transmitted disease)    CHL 20 yrs ago   Thyroid nodule 2010   "precancerous; thyroid treated with radiation"   Tubular adenoma of colon 06/2015   Past Surgical History:  Procedure Laterality Date   CARDIAC CATHETERIZATION     unable to do cardiac cath but does stress echo 11/22/11   CARDIAC CATHETERIZATION N/A 12/04/2015   Procedure: Left Heart Cath and Coronary Angiography;  Surgeon: Adrian Prows, MD;  Location: [facility];  Service: Cardiovascular;  Laterality: N/A;   CARDIAC CATHETERIZATION N/A 12/04/2015   Procedure: Intravascular Pressure Wire/FFR Study;  Surgeon: Adrian Prows, MD;  Location: [place] [facility];  Service: Cardiovascular;  Laterality: N/A;   CARDIAC CATHETERIZATION N/A 12/07/2015   Procedure: Coronary Stent Intervention;  Surgeon: Adrian Prows, MD;  Location: [facility];  Service: Cardiovascular;  Laterality: N/A;   COLONOSCOPY     CORONARY STENT PLACEMENT     EYE MUSCLE SURGERY Left 1969   LAPAROSCOPIC CHOLECYSTECTOMY  2008   LEFT HEART CATH AND CORONARY ANGIOGRAPHY N/A 11/24/2017   Procedure: LEFT HEART CATH AND CORONARY ANGIOGRAPHY;  Surgeon: Nigel Mormon, MD;  Location: [facility];  Service: Cardiovascular;  Laterality: N/A;   POLYPECTOMY     TUBAL LIGATION Bilateral 1995   UPPER GASTROINTESTINAL ENDOSCOPY     VENTRICULAR ABLATION SURGERY  2006,2010   times 2, follow-up for v-tack   Social History:  reports that she has never smoked. She has never used smokeless tobacco. She reports  that she does not drink alcohol and does not use drugs.  Allergies  Allergen Reactions   Crestor [Rosuvastatin Calcium] Other (See Comments)    Muscle aches and pains   Lipitor [Atorvastatin] Other (See Comments)    Muscle aches and pains   Lisinopril Cough   Livalo [Pitavastatin]     achy    Family History  Problem Relation Age of Onset   Hypertension Father    Prostate cancer Father    Heart attack Father    Colon cancer Father 14   Hypertension  Mother    Diabetes Mother    Colon polyps Mother    Crohn's disease Daughter    Heart attack Cousin    Diabetes Maternal Grandmother    Stomach cancer Maternal Grandmother    Cancer Sister        ovarian   Colon polyps Sister    Diabetes Brother    Esophageal cancer Neg Hx    Rectal cancer Neg Hx     Prior to Admission medications   Medication Sig Start Date End Date Taking? Authorizing Provider  amLODipine (NORVASC) 5 MG tablet Take 5 mg by mouth daily. 09/28/21   [provider]  atenolol (TENORMIN) 25 MG tablet Take 1 tablet (25 mg total) by mouth daily. -- Office visit needed for further refills 09/05/18   Plotnikov, Evie Lacks, MD  bacitracin-polymyxin b (POLYSPORIN) ophthalmic ointment Place 1 Application into the left eye every 12 (twelve) hours. apply to eye every 12 hours while awake 04/18/22   Hoyt Koch, MD  Cholecalciferol (VITAMIN D) 2000 units tablet Take 2,000 Units by mouth daily.    [provider]  MAGNESIUM PO Take 1 tablet by mouth every other day.    [provider]    Physical Exam: Vitals:   06/16/22 2352 06/16/22 2354 06/17/22 0000 06/17/22 0045  BP: (!) 145/87  (!) 141/78 (!) 147/89  Pulse: (!) 48  (!) 49 (!) 54  Resp: 16  14 (!) 27  Temp:  97.9 F (36.6 C)    TempSrc:  Oral    SpO2: 98%  99% 100%  Weight:      Height:       Constitutional: NAD, calm, comfortable, middle-age female laying in bed Eyes: lids and conjunctivae normal ENMT: Mucous membranes are moist.  Neck: normal, supple,  Respiratory: clear to auscultation bilaterally, no wheezing, no crackles. Normal respiratory effort. .  Cardiovascular: Regular rate and rhythm, no murmurs / rubs / gallops. No extremity edema.   Abdomen: no tenderness, Bowel sounds positive.  Musculoskeletal: no clubbing / cyanosis. No joint deformity upper and lower extremities. Good ROM, no contractures. Normal muscle tone.  Skin: no rashes, lesions, ulcers. No  induration Neurologic: CN 2-12 grossly intact.Strength 5/5 in all 4.  Psychiatric: Normal judgment and insight. Alert and oriented x 3. Normal mood. Data Reviewed:  See HPI  Assessment and Plan: * Chest pain - History of mid LAD stent in 11/2015. -Last cardiac cath in 2019 with mild nonobstructive CAD and patent mid LAD stent with no restenosis --Had normal myocardial perfusion scan in 05/2021 that was a good quality study with normal resting and stress perfusion. -Has ongoing angina symptoms at rest for the past 2 months. Had both statin and Repatha intolerance and currently off any medications for lipid control. -Had transient relief with sublingual nitro x2 in the ED but continues to endorse pressure chest pain with radiation.  Troponin of 4 but given her history and  ongoing symptoms, symptoms still concerning for unstable angina.  Will initiate nitroglycerin and heparin infusion for chest pain.   -NPO midnight in anticipation of intervention. Likely will need cardiac catheterization. Need cardiology consult in the morning.  Sinus bradycardia Chronic for the past year. She is on atenolol for PVCs which she feels has been more frequent but also has been symptomatic with bradycardia.  -will decrease atenolol to 12.'5mg'$   Hypothyroidism Continue levothyroxine  Essential hypertension Continue home antihypertensives  Hyperlipidemia with target LDL less than 70 Statin and Repatha intolerance. -Obtain lipid panel      Advance Care Planning:   Code Status: Full Code   Consults: none  Family Communication: Discussed with husband at bedside  Severity of Illness: The appropriate patient status for this patient is OBSERVATION. Observation status is judged to be reasonable and necessary in order to provide the required intensity of service to ensure the patient's safety. The patient's presenting symptoms, physical exam findings, and initial radiographic and laboratory data in the context of  their medical condition is felt to place them at decreased risk for further clinical deterioration. Furthermore, it is anticipated that the patient will be medically stable for discharge from the hospital within 2 midnights of admission.   Author: Orene Desanctis, DO 06/17/2022 1:01 AM  For on call review [email or web address].

## 2022-06-16 NOTE — H&P (Incomplete)
History and Physical    Patient: [name] MGQ:[id number] DOB: 17-Jun-1966 DOA: 06/16/2022 DOS: the patient was seen and examined on 06/16/2022 PCP: Plotnikov, Evie Lacks, MD  Patient coming from: Home  Chief Complaint:  Chief Complaint  Patient presents with  . Chest Pain   HPI: [name] is a 56 y.o. female with medical history significant of CAD s/p stent, hypertension, statin intolerance, hypothyroidism who presents with chest pain and increasing shortness of breath.  In the ED, she was bradycardic with heart rate in the 40s, normotensive.  CBC and BMP unremarkable. Troponin of 4.  Chest x-ray is negative.  EKG with sinus bradycardia and first-degree AV block.  Patient received total of 2 nitroglycerin but had minimal improvement.  She declined being placed on nitro infusion. Review of Systems: {ROS_Text:26778} Past Medical History:  Diagnosis Date  . Acute pancreatitis 2011  . Allergy   . Anemia   . CAD (coronary artery disease) 2009   Dr Olegario Shearer -[facility], Heart Cath  . Family history of adverse reaction to anesthesia    "mother and brother have difficulty waking up out from it"  . GERD (gastroesophageal reflux disease)   . Hyperlipidemia   . Hypertension   . Hypothyroidism    "recently dx'd" (12/07/2015)  . PVC (premature ventricular contraction)   . Statin intolerance   . STD (sexually transmitted disease)    CHL 20 yrs ago  . Thyroid nodule 2010   "precancerous; thyroid treated with radiation"  . Tubular adenoma of colon 06/2015   Past Surgical History:  Procedure Laterality Date  . CARDIAC CATHETERIZATION     unable to do cardiac cath but does stress echo 11/22/11  . CARDIAC CATHETERIZATION N/A 12/04/2015   Procedure: Left Heart Cath and Coronary Angiography;  Surgeon: Adrian Prows, MD;  Location: [facility];  Service: Cardiovascular;  Laterality: N/A;  . CARDIAC CATHETERIZATION N/A 12/04/2015   Procedure: Intravascular Pressure Wire/FFR  Study;  Surgeon: Adrian Prows, MD;  Location: [place] [facility];  Service: Cardiovascular;  Laterality: N/A;  . CARDIAC CATHETERIZATION N/A 12/07/2015   Procedure: Coronary Stent Intervention;  Surgeon: Adrian Prows, MD;  Location: [facility];  Service: Cardiovascular;  Laterality: N/A;  . COLONOSCOPY    . CORONARY STENT PLACEMENT    . EYE MUSCLE SURGERY Left 1969  . LAPAROSCOPIC CHOLECYSTECTOMY  2008  . LEFT HEART CATH AND CORONARY ANGIOGRAPHY N/A 11/24/2017   Procedure: LEFT HEART CATH AND CORONARY ANGIOGRAPHY;  Surgeon: Nigel Mormon, MD;  Location: [facility];  Service: Cardiovascular;  Laterality: N/A;  . POLYPECTOMY    . TUBAL LIGATION Bilateral 1995  . UPPER GASTROINTESTINAL ENDOSCOPY    . VENTRICULAR ABLATION SURGERY  2006,2010   times 2, follow-up for v-tack   Social History:  reports that she has never smoked. She has never used smokeless tobacco. She reports that she does not drink alcohol and does not use drugs.  Allergies  Allergen Reactions  . Crestor [Rosuvastatin Calcium] Other (See Comments)    Muscle aches and pains  . Lipitor [Atorvastatin] Other (See Comments)    Muscle aches and pains  . Lisinopril Cough  . Livalo [Pitavastatin]     achy    Family History  Problem Relation Age of Onset  . Hypertension Father   . Prostate cancer Father   . Heart attack Father   . Colon cancer Father 36  . Hypertension Mother   . Diabetes Mother   . Colon  polyps Mother   . Crohn's disease Daughter   . Heart attack Cousin   . Diabetes Maternal Grandmother   . Stomach cancer Maternal Grandmother   . Cancer Sister        ovarian  . Colon polyps Sister   . Diabetes Brother   . Esophageal cancer Neg Hx   . Rectal cancer Neg Hx     Prior to Admission medications   Medication Sig Start Date End Date Taking? Authorizing Provider  amLODipine (NORVASC) 5 MG tablet Take 5 mg by mouth daily. 09/28/21   [provider]  atenolol (TENORMIN) 25 MG tablet  Take 1 tablet (25 mg total) by mouth daily. -- Office visit needed for further refills 09/05/18   Plotnikov, Evie Lacks, MD  bacitracin-polymyxin b (POLYSPORIN) ophthalmic ointment Place 1 Application into the left eye every 12 (twelve) hours. apply to eye every 12 hours while awake 04/18/22   Hoyt Koch, MD  Cholecalciferol (VITAMIN D) 2000 units tablet Take 2,000 Units by mouth daily.    [provider]  MAGNESIUM PO Take 1 tablet by mouth every other day.    [provider]    Physical Exam: Vitals:   06/16/22 2230 06/16/22 2245 06/16/22 2352 06/16/22 2354  BP: 107/71 117/73 (!) 145/87   Pulse: (!) 47 (!) 46 (!) 48   Resp: '13 13 16   '$ Temp:    97.9 F (36.6 C)  TempSrc:    Oral  SpO2: 99% 100% 98%   Weight:      Height:       *** Data Reviewed: {Tip this will not be part of the note when signed- Document your independent interpretation of telemetry tracing, EKG, lab, Radiology test or any other diagnostic tests. Add any new diagnostic test ordered today. (Optional):26781} {Results:26384}  Assessment and Plan: No notes have been filed under this hospital service. Service: Hospitalist     Advance Care Planning:   Code Status: Prior ***  Consults: ***  Family Communication: ***  Severity of Illness: {Observation/Inpatient:21159}  Author: Orene Desanctis, DO 06/16/2022 11:58 PM  For on call review [email or web address].

## 2022-06-17 ENCOUNTER — Encounter (HOSPITAL_COMMUNITY): Payer: Self-pay | Admitting: Cardiology

## 2022-06-17 ENCOUNTER — Encounter (HOSPITAL_COMMUNITY)
Admission: EM | Disposition: A | Discharge status: Home / Self Care | Payer: Self-pay | Source: Home / Self Care | Attending: Emergency Medicine

## 2022-06-17 DIAGNOSIS — R001 Bradycardia, unspecified: Secondary | ICD-10-CM

## 2022-06-17 DIAGNOSIS — R072 Precordial pain: Secondary | ICD-10-CM | POA: Diagnosis not present

## 2022-06-17 DIAGNOSIS — E890 Postprocedural hypothyroidism: Secondary | ICD-10-CM | POA: Diagnosis not present

## 2022-06-17 DIAGNOSIS — I10 Essential (primary) hypertension: Secondary | ICD-10-CM | POA: Diagnosis not present

## 2022-06-17 DIAGNOSIS — E785 Hyperlipidemia, unspecified: Secondary | ICD-10-CM | POA: Diagnosis not present

## 2022-06-17 HISTORY — PX: LEFT HEART CATH AND CORONARY ANGIOGRAPHY: CATH118249

## 2022-06-17 LAB — TROPONIN I (HIGH SENSITIVITY): Troponin I (High Sensitivity): 4 ng/L (ref <18)

## 2022-06-17 LAB — LIPID PANEL
Cholesterol: 235 mg/dL — ABNORMAL HIGH (ref 0–200)
HDL: 62 mg/dL (ref >40)
LDL Cholesterol: 158 mg/dL — ABNORMAL HIGH (ref 0–99)
Total CHOL/HDL Ratio: 3.8 RATIO
Triglycerides: 74 mg/dL (ref <150)
VLDL: 15 mg/dL (ref 0–40)

## 2022-06-17 LAB — HEPARIN LEVEL (UNFRACTIONATED): Heparin Unfractionated: 0.32 IU/mL (ref 0.30–0.70)

## 2022-06-17 SURGERY — LEFT HEART CATH AND CORONARY ANGIOGRAPHY
Anesthesia: LOCAL

## 2022-06-17 MED ORDER — VERAPAMIL HCL 2.5 MG/ML IV SOLN
INTRAVENOUS | Status: DC | PRN
  Administered 2022-06-17: 10 mL via INTRA_ARTERIAL

## 2022-06-17 MED ORDER — SODIUM CHLORIDE 0.9 % IV BOLUS
INTRAVENOUS | Status: AC | PRN
  Administered 2022-06-17: 500 mL via INTRAVENOUS

## 2022-06-17 MED ORDER — HEPARIN SODIUM (PORCINE) 1000 UNIT/ML IJ SOLN
INTRAMUSCULAR | Status: AC
  Filled 2022-06-17: qty 10

## 2022-06-17 MED ORDER — VERAPAMIL HCL 2.5 MG/ML IV SOLN
INTRAVENOUS | Status: AC
  Filled 2022-06-17: qty 2

## 2022-06-17 MED ORDER — SODIUM CHLORIDE 0.9 % IV SOLN: 250.0000 mL | INTRAVENOUS | Status: DC | PRN

## 2022-06-17 MED ORDER — LABETALOL HCL 5 MG/ML IV SOLN: 10.0000 mg | INTRAVENOUS | Status: AC | PRN

## 2022-06-17 MED ORDER — NITROGLYCERIN IN D5W 200-5 MCG/ML-% IV SOLN
INTRAVENOUS | Status: AC
  Filled 2022-06-17: qty 250

## 2022-06-17 MED ORDER — FENTANYL CITRATE (PF) 100 MCG/2ML IJ SOLN
INTRAMUSCULAR | Status: AC
  Filled 2022-06-17: qty 2

## 2022-06-17 MED ORDER — NITROGLYCERIN 1 MG/10 ML FOR IR/CATH LAB
INTRA_ARTERIAL | Status: DC | PRN
  Administered 2022-06-17: 200 ug via INTRACORONARY
  Administered 2022-06-17: 150 ug via INTRACORONARY

## 2022-06-17 MED ORDER — ISOSORBIDE MONONITRATE ER 60 MG PO TB24
60.0000 mg | ORAL_TABLET | Freq: Every day | ORAL | Status: DC
  Administered 2022-06-17 – 2022-06-18 (×2): 60 mg via ORAL
  Filled 2022-06-17 (×3): qty 1

## 2022-06-17 MED ORDER — HEPARIN SODIUM (PORCINE) 1000 UNIT/ML IJ SOLN
INTRAMUSCULAR | Status: DC | PRN
  Administered 2022-06-17: 4000 [IU] via INTRAVENOUS

## 2022-06-17 MED ORDER — LIDOCAINE HCL (PF) 1 % IJ SOLN
INTRAMUSCULAR | Status: AC
  Filled 2022-06-17: qty 30

## 2022-06-17 MED ORDER — ACETAMINOPHEN 325 MG PO TABS: 650.0000 mg | ORAL_TABLET | ORAL | Status: DC | PRN

## 2022-06-17 MED ORDER — HEPARIN (PORCINE) IN NACL 1000-0.9 UT/500ML-% IV SOLN
INTRAVENOUS | Status: DC | PRN
  Administered 2022-06-17 (×2): 500 mL

## 2022-06-17 MED ORDER — IOHEXOL 350 MG/ML SOLN
INTRAVENOUS | Status: DC | PRN
  Administered 2022-06-17: 45 mL

## 2022-06-17 MED ORDER — HEPARIN BOLUS VIA INFUSION
3500.0000 [IU] | Freq: Once | INTRAVENOUS | Status: AC
  Administered 2022-06-17: 3500 [IU] via INTRAVENOUS
  Filled 2022-06-17: qty 3500

## 2022-06-17 MED ORDER — HEPARIN (PORCINE) 25000 UT/250ML-% IV SOLN
800.0000 [IU]/h | INTRAVENOUS | Status: DC
  Administered 2022-06-17: 800 [IU]/h via INTRAVENOUS
  Filled 2022-06-17: qty 250

## 2022-06-17 MED ORDER — SODIUM CHLORIDE 0.9 % IV SOLN
INTRAVENOUS | Status: AC | PRN
  Administered 2022-06-17: 10 mL/h via INTRAVENOUS

## 2022-06-17 MED ORDER — SODIUM CHLORIDE 0.9% FLUSH: 3.0000 mL | INTRAVENOUS | Status: DC | PRN

## 2022-06-17 MED ORDER — SODIUM CHLORIDE 0.9 % WEIGHT BASED INFUSION: 3.0000 mL/kg/h | INTRAVENOUS | Status: DC

## 2022-06-17 MED ORDER — ONDANSETRON HCL 4 MG/2ML IJ SOLN: 4.0000 mg | Freq: Four times a day (QID) | INTRAMUSCULAR | Status: DC | PRN

## 2022-06-17 MED ORDER — SODIUM CHLORIDE 0.9% FLUSH
3.0000 mL | Freq: Two times a day (BID) | INTRAVENOUS | Status: DC
  Administered 2022-06-17 – 2022-06-18 (×2): 3 mL via INTRAVENOUS

## 2022-06-17 MED ORDER — MIDAZOLAM HCL 2 MG/2ML IJ SOLN
INTRAMUSCULAR | Status: AC
  Filled 2022-06-17: qty 2

## 2022-06-17 MED ORDER — HEPARIN (PORCINE) IN NACL 1000-0.9 UT/500ML-% IV SOLN
INTRAVENOUS | Status: AC
  Filled 2022-06-17: qty 1000

## 2022-06-17 MED ORDER — SODIUM CHLORIDE 0.9 % IV SOLN: INTRAVENOUS | Status: AC

## 2022-06-17 MED ORDER — NITROGLYCERIN IN D5W 200-5 MCG/ML-% IV SOLN
0.0000 ug/min | INTRAVENOUS | Status: DC
  Administered 2022-06-17: 5 ug/min via INTRAVENOUS
  Filled 2022-06-17: qty 250

## 2022-06-17 MED ORDER — ASPIRIN 81 MG PO CHEW
81.0000 mg | CHEWABLE_TABLET | ORAL | Status: AC
  Administered 2022-06-17: 81 mg via ORAL
  Filled 2022-06-17: qty 1

## 2022-06-17 MED ORDER — SODIUM CHLORIDE 0.9% FLUSH: 3.0000 mL | Freq: Two times a day (BID) | INTRAVENOUS | Status: DC

## 2022-06-17 MED ORDER — ISOSORBIDE MONONITRATE ER 60 MG PO TB24: 60.0000 mg | ORAL_TABLET | Freq: Every day | ORAL | 1 refills | Status: DC

## 2022-06-17 MED ORDER — MIDAZOLAM HCL 2 MG/2ML IJ SOLN
INTRAMUSCULAR | Status: DC | PRN
  Administered 2022-06-17 (×2): 1 mg via INTRAVENOUS

## 2022-06-17 MED ORDER — ASPIRIN 81 MG PO TBEC: 81.0000 mg | DELAYED_RELEASE_TABLET | Freq: Every day | ORAL | 2 refills | Status: DC

## 2022-06-17 MED ORDER — FENTANYL CITRATE (PF) 100 MCG/2ML IJ SOLN
INTRAMUSCULAR | Status: DC | PRN
  Administered 2022-06-17: 50 ug via INTRAVENOUS
  Administered 2022-06-17: 25 ug via INTRAVENOUS

## 2022-06-17 MED ORDER — LIDOCAINE HCL (PF) 1 % IJ SOLN
INTRAMUSCULAR | Status: DC | PRN
  Administered 2022-06-17: 2 mL

## 2022-06-17 MED ORDER — HYDRALAZINE HCL 20 MG/ML IJ SOLN: 10.0000 mg | INTRAMUSCULAR | Status: AC | PRN

## 2022-06-17 MED ORDER — SODIUM CHLORIDE 0.9 % WEIGHT BASED INFUSION: 1.0000 mL/kg/h | INTRAVENOUS | Status: DC

## 2022-06-17 SURGICAL SUPPLY — 13 items
CATH INFINITI JR4 5F (CATHETERS) IMPLANT
CATH LAUNCHER 5F EBU3.0 (CATHETERS) IMPLANT
CATH OPTITORQUE TIG 4.0 5F (CATHETERS) IMPLANT
CATHETER LAUNCHER 5F EBU3.0 (CATHETERS) ×1
DEVICE RAD COMP TR BAND LRG (VASCULAR PRODUCTS) IMPLANT
GLIDESHEATH SLEND A-KIT 6F 22G (SHEATH) IMPLANT
GUIDEWIRE INQWIRE 1.5J.035X260 (WIRE) IMPLANT
INQWIRE 1.5J .035X260CM (WIRE) ×1
KIT HEART LEFT (KITS) ×1 IMPLANT
PACK CARDIAC CATHETERIZATION (CUSTOM PROCEDURE TRAY) ×1 IMPLANT
SYR MEDRAD MARK 7 150ML (SYRINGE) ×1 IMPLANT
TRANSDUCER W/STOPCOCK (MISCELLANEOUS) ×1 IMPLANT
TUBING CIL FLEX 10 FLL-RA (TUBING) ×1 IMPLANT

## 2022-06-17 NOTE — Progress Notes (Signed)
ANTICOAGULATION CONSULT NOTE - Follow-Up Consult  Pharmacy Consult:  Heparin Indication: chest pain/ACS  Allergies  Allergen Reactions   Crestor [Rosuvastatin Calcium] Other (See Comments)    Muscle aches and pains   Lipitor [Atorvastatin] Other (See Comments)    Muscle aches and pains   Lisinopril Cough   Livalo [Pitavastatin] Other (See Comments)    Muscle aches and pain   Repatha [Evolocumab] Hives, Rash and Other (See Comments)    Back pain    Patient Measurements: Height: '5\' 2"'$  (157.5 cm) Weight: 80.7 kg (178 lb) IBW/kg (Calculated) : 50.1 Heparin Dosing Weight: 68 kg  Vital Signs: Temp: 97.9 F (36.6 C) (09/21 2354) Temp Source: Oral (09/21 2354) BP: 134/82 (09/22 0915) Pulse Rate: 58 (09/22 0915)  Labs: Recent Labs    06/16/22 1653 06/16/22 1922 06/17/22 0749  HGB 13.9  --   --   HCT 42.6  --   --   PLT 270  --   --   HEPARINUNFRC  --   --  0.32  CREATININE 0.87  --   --   TROPONINIHS 6 4  --      Estimated Creatinine Clearance: 71 mL/min (by C-G formula based on SCr of 0.87 mg/dL).   Medical History: Past Medical History:  Diagnosis Date   Acute pancreatitis 2011   Allergy    Anemia    CAD (coronary artery disease) 2009   Dr Olegario Shearer -[facility], Heart Cath   Family history of adverse reaction to anesthesia    "mother and brother have difficulty waking up out from it"   GERD (gastroesophageal reflux disease)    Hyperlipidemia    Hypertension    Hypothyroidism    "recently dx'd" (12/07/2015)   PVC (premature ventricular contraction)    Statin intolerance    STD (sexually transmitted disease)    CHL 20 yrs ago   Thyroid nodule 2010   "precancerous; thyroid treated with radiation"   Tubular adenoma of colon 06/2015    Assessment: 59 YOF with history of CAD, HLD, HTN, and PVCs presented with chest pain.  Troponin negative.  Pharmacy consulted to dose IV heparin.  No blood thinner PTA.  Hgb 13.9, Plt 270 Heparin level: 0.32,  therapeutic Heparin infusion rate: 800 units/hr  Goal of Therapy:  Heparin level 0.3-0.7 units/ml Monitor platelets by anticoagulation protocol: Yes   Plan:  Heparin was discontinued by Dr. Virgina Jock after heparin level was drawn.  If heparin is restarted, would consider initiating at heparin rate of 850 units/hr.  Luisa Hart, PharmD, BCPS Clinical Pharmacist 06/17/2022 9:30 AM   Please refer to [facility] for pharmacy phone number

## 2022-06-17 NOTE — Progress Notes (Signed)
Some bleeding at TR band site.  2 CCs added back to TR band.  No bleeding currently.  MD notified.    06/17/22 1423  First Vascular Site Assessment  #1 - Location of Site Assessment Right radial  #1 - Vascular Site Assessment Scale Level 1  #1 - Hematoma present? No  #1 - Air in Band 13 cc (added 2 cc back due to bleeding)

## 2022-06-17 NOTE — Progress Notes (Signed)
Still has ongoing nitrate responsive chest pain. Discussed with the patient. Would like to proceed with cath.   Nigel Mormon, MD Pager: [phone] Office: [phone]

## 2022-06-17 NOTE — Discharge Summary (Signed)
Physician Discharge Summary  [name] BWL:[id number] DOB: Apr 11, 1966 DOA: 06/16/2022  PCP: Cassandria Anger, MD  Admit date: 06/16/2022 Discharge date: 06/17/2022  Admitted From: Home Disposition:  Home   Recommendations for Outpatient Follow-up:  Follow up with PCP in 1-2 weeks Please obtain BMP/CBC in one week To follow with her primary cardiologist at [facility]   Discharge Condition: Stable CODE STATUS:FULL Diet recommendation: Heart Healthy   Brief/Interim Summary:  [name] is a 56 y.o. female with medical history significant of CAD s/p stent, hypertension, statin intolerance, hypothyroidism, obesity , PVCs, symptomatic bradycardia who presents with chest pain and increasing shortness of breath.  Patient has intermittent chest pain at rest for the past 2 months after being taken off Repatha for intolerance of side effects.  Also has intolerance of statin.  She reports acute sharp left-sided chest pain rating to her neck today while sitting at work looking at paperwork.  Felt diaphoretic and nauseous.  Symptoms lasted about 15 minutes but currently continues to have a dull pressure chest pain.  She has been given 2 sublingual nitros in the ED with transient improvement.  -In the ED, she was bradycardic with heart rate in the 40s, normotensive.  CBC and BMP unremarkable. Troponin of 4.  Chest x-ray is negative. - EKG with sinus bradycardia and first-degree AV block.  - She was given 325 aspirin, 15 mg of IV Toradol in the ED.  Hospitalist then consulted for admission.    Chest pain ACS ruled out -History for mid LAD stent and 11/2015, last cardiac cath in 2019 with mild nonobstructive CAD, and patent mid LAD stent with no restenosis. -Given her risk factors, she was admitted, for which she was started on heparin and nitro drip, and cardiology were consulted -status  post cardiac cath by Dr. Virgina Jock, please see results below  LM: Normal LAD: 20%  prox, 20% mid disease, partly ISR Lcx: Mild diffuse disease RCA: Mild diffuse disease   Sluggish TIMI 2 flow in all epicardial coronary beds, instantly improved with IC NTG   Normal LVEDP Normal LVEF   No significant epicardial artery stenoses Suspect nitrate responsive microvascular angina Recommend addition of PO Imdur 60 mg daily Recommend Aspirin 81 mg daily, lipid reduction. Patient has been intolerant to statins and Repatha. Consider Bempedoic acid. Defer to primary cardiologist.  Faythe Ghee to discharge home this afternoon Follow up with primary cardiologist in [place]   Will add Imdur on discharge  Sinus bradycardia -Symptomatic, no malignant heart block, continue with home medications   Hypothyroidism Continue levothyroxine   Essential hypertension Continue home antihypertensives   Hyperlipidemia with target LDL less than 70 Statin and Repatha intolerance. To follow with her primary cardiologist, can consider Bempedioc  Discharge Diagnoses:  Principal Problem:   Chest pain Active Problems:   Hyperlipidemia with target LDL less than 70   Essential hypertension   Hypothyroidism   Sinus bradycardia    Discharge Instructions  Discharge Instructions     Diet - low sodium heart healthy   Complete by: As directed    Increase activity slowly   Complete by: As directed       Allergies as of 06/17/2022       Reactions   Crestor [rosuvastatin Calcium] Other (See Comments)   Muscle aches and pains   Lipitor [atorvastatin] Other (See Comments)   Muscle aches and pains   Lisinopril Cough   Livalo [pitavastatin] Other (See Comments)   Muscle aches and pain  Repatha [evolocumab] Hives, Rash, Other (See Comments)   Back pain        Medication List     TAKE these medications    acetaminophen 500 MG tablet Commonly known as: TYLENOL Take 1,000 mg by mouth as needed (pain).   amLODipine 5 MG tablet Commonly known as: NORVASC Take 5 mg by mouth  daily.   aspirin EC 81 MG tablet Take 1 tablet (81 mg total) by mouth daily. Swallow whole.   atenolol 25 MG tablet Commonly known as: TENORMIN Take 1 tablet (25 mg total) by mouth daily. -- Office visit needed for further refills What changed: additional instructions   bacitracin-polymyxin b ophthalmic ointment Commonly known as: POLYSPORIN Place 1 Application into the left eye every 12 (twelve) hours. apply to eye every 12 hours while awake What changed: when to take this   doxycycline 100 MG tablet Commonly known as: ADOXA Take 100 mg by mouth 2 (two) times daily.   isosorbide mononitrate 60 MG 24 hr tablet Commonly known as: IMDUR Take 1 tablet (60 mg total) by mouth daily. Start taking on: June 18, 2022   neomycin-polymyxin b-dexamethasone 3.5-10000-0.1 Oint Commonly known as: MAXITROL   Vitamin D 50 MCG (2000 UT) tablet Take 2,000 Units by mouth daily.        Follow-up Information     Plotnikov, Evie Lacks, MD.   Specialty: Internal Medicine Contact information: [place] [address] [phone]         [place].   Specialty: Emergency Medicine Contact information: [address] [id number] [place] [address] [phone]               Allergies  Allergen Reactions   Crestor [Rosuvastatin Calcium] Other (See Comments)    Muscle aches and pains   Lipitor [Atorvastatin] Other (See Comments)    Muscle aches and pains   Lisinopril Cough   Livalo [Pitavastatin] Other (See Comments)    Muscle aches and pain   Repatha [Evolocumab] Hives, Rash and Other (See Comments)    Back pain    Consultations: Cardiology Dr. Virgina Jock   Procedures/Studies: CARDIAC CATHETERIZATION  Result Date: 06/17/2022 Images from the original result were not included. LM: Normal LAD: 20% prox, 20% mid disease, partly ISR Lcx: Mild diffuse disease RCA: Mild diffuse disease  Sluggish TIMI 2 flow in all epicardial coronary beds, instantly improved with IC NTG Normal LVEDP Normal LVEF No significant epicardial artery stenoses Suspect nitrate responsive microvascular angina Recommend addition of PO Imdur 60 mg daily Recommend Aspirin 81 mg daily, lipid reduction. Patient has been intolerant to statins and Repatha. Consider Bempedoic acid. Defer to primary cardiologist. Faythe Ghee to discharge home this afternoon Follow up with primary cardiologist in [place] Nigel Mormon, MD Pager: [phone] Office: [phone]  DG Chest 2 View  Result Date: 06/16/2022 CLINICAL DATA:  Chest pain radiating to neck, shortness of breath EXAM: CHEST - 2 VIEW COMPARISON:  05/29/2021 FINDINGS: Frontal and lateral views of the chest demonstrate an unremarkable cardiac silhouette. No airspace disease, effusion, or pneumothorax. No acute bony abnormalities. IMPRESSION: 1. No acute intrathoracic process. Electronically Signed   By: Randa Ngo M.D.   On: 06/16/2022 17:17   (Echo, Carotid, EGD, Colonoscopy, ERCP)    Subjective: She denies any chest pain or shortness of breath currently.  Discharge Exam: Vitals:   06/17/22 1226 06/17/22 1252  BP: 124/85 127/85  Pulse: 65 (!) 52  Resp: 15 14  Temp:  97.8 F (36.6 C)  SpO2: 94% 100%   Vitals:   06/17/22 1216 06/17/22 1221 06/17/22 1226 06/17/22 1252  BP: 117/77 107/78 124/85 127/85  Pulse: 70 70 65 (!) 52  Resp: '15 16 15 14  '$ Temp:    97.8 F (36.6 C)  TempSrc:    Oral  SpO2: 96% 99% 94% 100%  Weight:      Height:        General: Pt is alert, awake, not in acute distress Cardiovascular: RRR, S1/S2 +, no rubs, no gallops Respiratory: CTA bilaterally, no wheezing, no rhonchi Abdominal: Soft, NT, ND, bowel sounds + Extremities: no edema, no cyanosis    The results of significant diagnostics from this hospitalization (including imaging, microbiology, ancillary and laboratory) are listed below for reference.      Microbiology: No results found for this or any previous visit (from the past 240 hour(s)).   Labs: BNP (last 3 results) No results for input(s): "BNP" in the last 8760 hours. Basic Metabolic Panel: Recent Labs  Lab 06/16/22 1653  NA 142  K 4.3  CL 107  CO2 24  GLUCOSE 86  BUN 18  CREATININE 0.87  CALCIUM 9.9   Liver Function Tests: No results for input(s): "AST", "ALT", "ALKPHOS", "BILITOT", "PROT", "ALBUMIN" in the last 168 hours. No results for input(s): "LIPASE", "AMYLASE" in the last 168 hours. No results for input(s): "AMMONIA" in the last 168 hours. CBC: Recent Labs  Lab 06/16/22 1653  WBC 6.8  HGB 13.9  HCT 42.6  MCV 94.2  PLT 270   Cardiac Enzymes: No results for input(s): "CKTOTAL", "CKMB", "CKMBINDEX", "TROPONINI" in the last 168 hours. BNP: Invalid input(s): "POCBNP" CBG: No results for input(s): "GLUCAP" in the last 168 hours. D-Dimer No results for input(s): "DDIMER" in the last 72 hours. Hgb A1c No results for input(s): "HGBA1C" in the last 72 hours. Lipid Profile Recent Labs    06/17/22 0105  CHOL 235*  HDL 62  LDLCALC 158*  TRIG 74  CHOLHDL 3.8   Thyroid function studies No results for input(s): "TSH", "T4TOTAL", "T3FREE", "THYROIDAB" in the last 72 hours.  Invalid input(s): "FREET3" Anemia work up No results for input(s): "VITAMINB12", "FOLATE", "FERRITIN", "TIBC", "IRON", "RETICCTPCT" in the last 72 hours. Urinalysis    Component Value Date/Time   COLORURINE YELLOW 03/19/2019 0815   APPEARANCEUR CLEAR 03/19/2019 0815   LABSPEC 1.010 03/19/2019 0815   PHURINE 6.5 03/19/2019 0815   GLUCOSEU NEGATIVE 03/19/2019 0815   HGBUR NEGATIVE 03/19/2019 0815   HGBUR negative 08/08/2008 1350   BILIRUBINUR NEGATIVE 03/19/2019 0815   BILIRUBINUR neg 10/28/2016 0823   KETONESUR NEGATIVE 03/19/2019 0815   PROTEINUR NEGATIVE 12/01/2017 1628   UROBILINOGEN 0.2 03/19/2019 0815   NITRITE NEGATIVE 03/19/2019 0815   LEUKOCYTESUR NEGATIVE  03/19/2019 0815   Sepsis Labs Recent Labs  Lab 06/16/22 1653  WBC 6.8   Microbiology No results found for this or any previous visit (from the past 240 hour(s)).   Time coordinating discharge: Over 30 minutes  SIGNED:   Phillips Climes, MD  [facility] 06/17/2022, 12:59 PM Pager   If 7PM-7AM, please contact night-coverage [email or web address]

## 2022-06-17 NOTE — Progress Notes (Signed)
TR band removed. Gauze dressing placed. Site clean, dry and intact.   Patient and husband expressed concern of going home tonight. Discharge orders are placed but patient and husband would feel better if they could stay the night and d/c in AM.  Of note, patient did have some initial bleeding when removing air from TR band.   No issues noted at this time.  On call for [facility] notified (Dr. Cyd Silence). Awaiting call back.

## 2022-06-17 NOTE — Assessment & Plan Note (Signed)
Chronic for the past year. She is on atenolol for PVCs which she feels has been more frequent but also has been symptomatic with bradycardia.  -will decrease atenolol to 12.'5mg'$ 

## 2022-06-17 NOTE — Progress Notes (Signed)
ANTICOAGULATION CONSULT NOTE - Initial Consult  Pharmacy Consult:  Heparin Indication: chest pain/ACS  Allergies  Allergen Reactions   Crestor [Rosuvastatin Calcium] Other (See Comments)    Muscle aches and pains   Lipitor [Atorvastatin] Other (See Comments)    Muscle aches and pains   Lisinopril Cough   Livalo [Pitavastatin]     achy    Patient Measurements: Height: '5\' 2"'$  (157.5 cm) Weight: 80.7 kg (178 lb) IBW/kg (Calculated) : 50.1 Heparin Dosing Weight: 68 kg  Vital Signs: Temp: 97.9 F (36.6 C) (09/21 2354) Temp Source: Oral (09/21 2354) BP: 141/78 (09/22 0000) Pulse Rate: 49 (09/22 0000)  Labs: Recent Labs    06/16/22 1653 06/16/22 1922  HGB 13.9  --   HCT 42.6  --   PLT 270  --   CREATININE 0.87  --   TROPONINIHS 6 4    Estimated Creatinine Clearance: 71 mL/min (by C-G formula based on SCr of 0.87 mg/dL).   Medical History: Past Medical History:  Diagnosis Date   Acute pancreatitis 2011   Allergy    Anemia    CAD (coronary artery disease) 2009   Dr Olegario Shearer -[place] Heart Cath   Family history of adverse reaction to anesthesia    "mother and brother have difficulty waking up out from it"   GERD (gastroesophageal reflux disease)    Hyperlipidemia    Hypertension    Hypothyroidism    "recently dx'd" (12/07/2015)   PVC (premature ventricular contraction)    Statin intolerance    STD (sexually transmitted disease)    CHL 20 yrs ago   Thyroid nodule 2010   "precancerous; thyroid treated with radiation"   Tubular adenoma of colon 06/2015     Assessment: 33 YOF with history of CAD, HLD, HTN, and PVCs presented with chest pain.  Troponin negative.  Pharmacy consulted to dose IV heparin.  No blood thinner PTA.  Renal function and CBC stable.  Goal of Therapy:  Heparin level 0.3-0.7 units/ml Monitor platelets by anticoagulation protocol: Yes   Plan:  Heparin 3500 units IV bolus, then  Heparin gtt at 800 units/hr Check 6 hr heparin  level Daily heparin level and CBC  Greenlee Ancheta D. Mina Marble, PharmD, BCPS, [place] 06/17/2022, 12:41 AM

## 2022-06-17 NOTE — ED Notes (Signed)
ED TO INPATIENT HANDOFF REPORT  S Name/Age/Gender [name] 56 y.o. female Room/Bed: 043C/043C  Code Status   Code Status: Full Code  Home/SNF/Other Home Patient oriented to: self, place, time, and situation Is this baseline? Yes   Triage Complete: Triage complete  Chief Complaint Chest pain [R07.9]  Triage Note Complains of chest pain and sob that started around 1300 radiating into her neck.  Initermittent pain.    Allergies Allergies  Allergen Reactions   Crestor [Rosuvastatin Calcium] Other (See Comments)    Muscle aches and pains   Lipitor [Atorvastatin] Other (See Comments)    Muscle aches and pains   Lisinopril Cough   Livalo [Pitavastatin]     achy    Level of Care/Admitting Diagnosis ED Disposition     ED Disposition  Admit   Condition  --   [place]: [place] [[id number]]  Level of Care: Progressive [102]  Admit to Progressive based on following criteria: CARDIOVASCULAR & THORACIC of moderate stability with acute coronary syndrome symptoms/low risk myocardial infarction/hypertensive urgency/arrhythmias/heart failure potentially compromising stability and stable post cardiovascular intervention patients.  May place patient in observation at [facility] or [place] if equivalent level of care is available:: No  Covid Evaluation: Asymptomatic - no recent exposure (last 10 days) testing not required  Diagnosis: Chest pain [179150]  Admitting Physician: Orene Desanctis [[id number]]  Attending Physician: Orene Desanctis [[id number]]          B Medical/Surgery History Past Medical History:  Diagnosis Date   Acute pancreatitis 2011   Allergy    Anemia    CAD (coronary artery disease) 2009   Dr Olegario Shearer [facility], Heart Cath   Family history of adverse reaction to anesthesia    "mother and brother have difficulty waking up out from it"   GERD (gastroesophageal reflux disease)    Hyperlipidemia    Hypertension     Hypothyroidism    "recently dx'd" (12/07/2015)   PVC (premature ventricular contraction)    Statin intolerance    STD (sexually transmitted disease)    CHL 20 yrs ago   Thyroid nodule 2010   "precancerous; thyroid treated with radiation"   Tubular adenoma of colon 06/2015   Past Surgical History:  Procedure Laterality Date   CARDIAC CATHETERIZATION     unable to do cardiac cath but does stress echo 11/22/11   CARDIAC CATHETERIZATION N/A 12/04/2015   Procedure: Left Heart Cath and Coronary Angiography;  Surgeon: Adrian Prows, MD;  Location: [place] [facility];  Service: Cardiovascular;  Laterality: N/A;   CARDIAC CATHETERIZATION N/A 12/04/2015   Procedure: Intravascular Pressure Wire/FFR Study;  Surgeon: Adrian Prows, MD;  Location: [place] [facility];  Service: Cardiovascular;  Laterality: N/A;   CARDIAC CATHETERIZATION N/A 12/07/2015   Procedure: Coronary Stent Intervention;  Surgeon: Adrian Prows, MD;  Location: [place] [facility];  Service: Cardiovascular;  Laterality: N/A;   COLONOSCOPY     CORONARY STENT PLACEMENT     EYE MUSCLE SURGERY Left 1969   LAPAROSCOPIC CHOLECYSTECTOMY  2008   LEFT HEART CATH AND CORONARY ANGIOGRAPHY N/A 11/24/2017   Procedure: LEFT HEART CATH AND CORONARY ANGIOGRAPHY;  Surgeon: Nigel Mormon, MD;  Location: [place] [facility];  Service: Cardiovascular;  Laterality: N/A;   POLYPECTOMY     TUBAL LIGATION Bilateral 1995   UPPER GASTROINTESTINAL ENDOSCOPY     VENTRICULAR ABLATION SURGERY  2006,2010   times 2, follow-up for v-tack     A  IV Location/Drains/Wounds Patient Lines/Drains/Airways Status     Active Line/Drains/Airways     Name Placement date Placement time Site Days   Peripheral IV 06/16/22 20 G Posterior;Right Forearm 06/16/22  2255  Forearm  1            Intake/Output Last 24 hours No intake or output data in the 24 hours ending 06/17/22 4401  Labs/Imaging Results for orders placed or performed during the hospital encounter of 06/16/22  (from the past 48 hour(s))  Basic metabolic panel     Status: None   Collection Time: 06/16/22  4:53 PM  Result Value Ref Range   Sodium 142 135 - 145 mmol/L   Potassium 4.3 3.5 - 5.1 mmol/L   Chloride 107 98 - 111 mmol/L   CO2 24 22 - 32 mmol/L   Glucose, Bld 86 70 - 99 mg/dL    Comment: Glucose reference range applies only to samples taken after fasting for at least 8 hours.   BUN 18 6 - 20 mg/dL   Creatinine, Ser 0.87 0.44 - 1.00 mg/dL   Calcium 9.9 8.9 - 10.3 mg/dL   GFR, Estimated >60 >60 mL/min    Comment: (NOTE) Calculated using the CKD-EPI Creatinine Equation (2021)    Anion gap 11 5 - 15    Comment: Performed at [facility] [address], [place], [place] [address]  CBC     Status: None   Collection Time: 06/16/22  4:53 PM  Result Value Ref Range   WBC 6.8 4.0 - 10.5 K/uL   RBC 4.52 3.87 - 5.11 MIL/uL   Hemoglobin 13.9 12.0 - 15.0 g/dL   HCT 42.6 36.0 - 46.0 %   MCV 94.2 80.0 - 100.0 fL   MCH 30.8 26.0 - 34.0 pg   MCHC 32.6 30.0 - 36.0 g/dL   RDW 13.2 11.5 - 15.5 %   Platelets 270 150 - 400 K/uL   nRBC 0.0 0.0 - 0.2 %    Comment: Performed at [facility], [place] [address], [place], [place] [address]  Troponin I (High Sensitivity)     Status: None   Collection Time: 06/16/22  4:53 PM  Result Value Ref Range   Troponin I (High Sensitivity) 6 <18 ng/L    Comment: (NOTE) Elevated high sensitivity troponin I (hsTnI) values and significant  changes across serial measurements may suggest ACS but many other  chronic and acute conditions are known to elevate hsTnI results.  Refer to the "Links" section for chest pain algorithms and additional  guidance. Performed at [facility], [place] [address] [place], [place] [address]   Troponin I (High Sensitivity)     Status: None   Collection Time: 06/16/22  7:22 PM  Result Value Ref Range   Troponin I (High Sensitivity) 4 <18 ng/L    Comment: (NOTE) Elevated high sensitivity troponin I (hsTnI) values and  significant  changes across serial measurements may suggest ACS but many other  chronic and acute conditions are known to elevate hsTnI results.  Refer to the "Links" section for chest pain algorithms and additional  guidance. Performed at [facility], [place] [address] [place], [place] [address]   Lipid panel     Status: Abnormal   Collection Time: 06/17/22  1:05 AM  Result Value Ref Range   Cholesterol 235 (H) 0 - 200 mg/dL   Triglycerides 74 <150 mg/dL   HDL 62 >40 mg/dL   Total CHOL/HDL Ratio 3.8  RATIO   VLDL 15 0 - 40 mg/dL   LDL Cholesterol 158 (H) 0 - 99 mg/dL    Comment:        Total Cholesterol/HDL:CHD Risk Coronary Heart Disease Risk Table                     Men   Women  1/2 Average Risk   3.4   3.3  Average Risk       5.0   4.4  2 X Average Risk   9.6   7.1  3 X Average Risk  23.4   11.0        Use the calculated Patient Ratio above and the CHD Risk Table to determine the patient's CHD Risk.        ATP III CLASSIFICATION (LDL):  <100     mg/dL   Optimal  100-129  mg/dL   Near or Above                    Optimal  130-159  mg/dL   Borderline  160-189  mg/dL   High  >190     mg/dL   Very High Performed at [facility] [address], [place], [place] [address]    DG Chest 2 View  Result Date: 06/16/2022 CLINICAL DATA:  Chest pain radiating to neck, shortness of breath EXAM: CHEST - 2 VIEW COMPARISON:  05/29/2021 FINDINGS: Frontal and lateral views of the chest demonstrate an unremarkable cardiac silhouette. No airspace disease, effusion, or pneumothorax. No acute bony abnormalities. IMPRESSION: 1. No acute intrathoracic process. Electronically Signed   By: Randa Ngo M.D.   On: 06/16/2022 17:17    Pending Labs Unresulted Labs (From admission, onward)     Start     Ordered   06/18/22 0500  Heparin level (unfractionated)  Daily at 5am,   R      06/17/22 0042   06/18/22 0500  CBC  Daily at 5am,   R      06/17/22 0042   06/17/22 0800  Heparin  level (unfractionated)  Once-Timed,   TIMED        06/17/22 0042            Vitals/Pain Today's Vitals   06/17/22 0705 06/17/22 0710 06/17/22 0715 06/17/22 0720  BP: 126/79 (!) 108/51 108/79 (!) 120/92  Pulse: (!) 56 (!) 54 (!) 56 72  Resp: '14 14 13 13  '$ Temp:      TempSrc:      SpO2: 98% 99% 97% 94%  Weight:      Height:      PainSc:        Isolation Precautions No active isolations  Medications Medications  aspirin tablet 325 mg (325 mg Oral Given 06/16/22 1852)  acetaminophen (TYLENOL) tablet 650 mg (has no administration in time range)  ondansetron (ZOFRAN) injection 4 mg (has no administration in time range)  nitroGLYCERIN 50 mg in dextrose 5 % 250 mL (0.2 mg/mL) infusion (10 mcg/min Intravenous Rate/Dose Change 06/17/22 0236)  heparin ADULT infusion 100 units/mL (25000 units/274m) (800 Units/hr Intravenous New Bag/Given 06/17/22 0109)  ketorolac (TORADOL) 15 MG/ML injection 15 mg (15 mg Intravenous Given 06/16/22 2255)  heparin bolus via infusion 3,500 Units (3,500 Units Intravenous Bolus from Bag 06/17/22 0109)    Mobility walks Low fall risk   Focused Assessments Cardiac Assessment Handoff:    Lab Results  Component Value Date   CKTOTAL 156 03/19/2019  CKMB 0.7 02/20/2011   TROPONINI <0.03 11/24/2017   Lab Results  Component Value Date   DDIMER 0.74 (H) 11/24/2017   Does the Patient currently have chest pain? No    R Recommendations: See Admitting Provider Note

## 2022-06-17 NOTE — Interval H&P Note (Signed)
History and Physical Interval Note:  06/17/2022 11:37 AM  [name]  has presented today for surgery, with the diagnosis of unstable angina.  The various methods of treatment have been discussed with the patient and family. After consideration of risks, benefits and other options for treatment, the patient has consented to  Procedure(s): LEFT HEART CATH AND CORONARY ANGIOGRAPHY (N/A) as a surgical intervention.  The patient's history has been reviewed, patient examined, no change in status, stable for surgery.  I have reviewed the patient's chart and labs.  Questions were answered to the patient's satisfaction.    2016 Appropriate Use Criteria for Coronary Revascularization in Patients With Acute Coronary Syndrome NSTEMI/Unstable angina, stabilized patient at Intermediate Risk (TIMI Score 3-4) Indication:  Revascularization by PCI or CABG of 1 or more arteries in a patient with NSTEMI or unstable angina with Stabilization after presentation Intermediate risk for clinical events A (7) Indication: 16; Score 7   [place]

## 2022-06-17 NOTE — Assessment & Plan Note (Signed)
Statin and Repatha intolerance. -Obtain lipid panel

## 2022-06-17 NOTE — Assessment & Plan Note (Addendum)
-   History of mid LAD stent in 11/2015. -Last cardiac cath in 2019 with mild nonobstructive CAD and patent mid LAD stent with no restenosis --Had normal myocardial perfusion scan in 05/2021 that was a good quality study with normal resting and stress perfusion. -Has ongoing angina symptoms at rest for the past 2 months. Had both statin and Repatha intolerance and currently off any medications for lipid control. -Had transient relief with sublingual nitro x2 in the ED but continues to endorse pressure chest pain with radiation.  Troponin of 4 but given her history and ongoing symptoms, symptoms still concerning for unstable angina.  Will initiate nitroglycerin and heparin infusion for chest pain.   -NPO midnight in anticipation of intervention. Likely will need cardiac catheterization. Need cardiology consult in the morning.

## 2022-06-17 NOTE — Progress Notes (Signed)
Pt returned from heart cath procedure.  TR band site is clean dry and intact with 15cc in band.  Pt is oriented X4 and neuro intact.    06/17/22 1252  Vitals  Temp 97.8 F (36.6 C)  Temp Source Oral  BP 127/85  MAP (mmHg) 95  BP Location Right Arm  BP Method Automatic  Patient Position (if appropriate) Lying  Pulse Rate (!) 52  Pulse Rate Source Monitor  ECG Heart Rate (!) 58  Resp 14  Level of Consciousness  Level of Consciousness Alert  Oxygen Therapy  SpO2 100 %  O2 Device Room Air  Pain Assessment  Pain Scale 0-10  Pain Score 0  POSS Scale (Pasero Opioid Sedation Scale)  POSS *See Group Information* 1-Acceptable,Awake and alert  Glasgow Coma Scale  Eye Opening 4  Best Verbal Response (NON-intubated) 5  Best Motor Response 6  Glasgow Coma Scale Score 15  MEWS Score  MEWS Temp 0  MEWS Systolic 0  MEWS Pulse 0  MEWS RR 0  MEWS LOC 0  MEWS Score 0  MEWS Score Color Green

## 2022-06-17 NOTE — Consult Note (Addendum)
CARDIOLOGY CONSULT NOTE  Patient ID: [name] MRN: [id number] DOB/AGE: 56/19/1967 56 y.o.  Admit date: 06/16/2022 Referring Physician: [facility] hospitalist Reason for Consultation:  Chest pain  HPI:   56 y.o. African American female  with hypertension, hyperlipidemia, CAD s/p prior PCI, statin intolerance, admitted for chest pain.  I have previously seen the patient for coronary angiogram in 2019 with no obstructive disease, and chest pain hospitalization in 2021.  More recently, patient has been seeing Dr. Elvis Coil and Dr. Atilano Median at [facility]  Patient had a negative stress test in 05/2021.  Recently, she was started on Repatha for hyperlipidemia and statin intolerance, but had to be stopped due to certain reaction of back pain the patient thought was due to [place].  Patient presented to [facility] ER on 06/16/2022 with episode of chest pain that she describes as electric pain radiating to her jaw that resolved on its own in 10 to 15 minutes.  However, chest tightness has persisted unabated for last 18 hours.  EKG and troponin showed no abnormalities.  Currently, patient is on IV nitroglycerin 10 mics per minute, and IV heparin.  Past Medical History:  Diagnosis Date   Acute pancreatitis 2011   Allergy    Anemia    CAD (coronary artery disease) 2009   Dr Olegario Shearer [facility] Heart Cath   Family history of adverse reaction to anesthesia    "mother and brother have difficulty waking up out from it"   GERD (gastroesophageal reflux disease)    Hyperlipidemia    Hypertension    Hypothyroidism    "recently dx'd" (12/07/2015)   PVC (premature ventricular contraction)    Statin intolerance    STD (sexually transmitted disease)    CHL 20 yrs ago   Thyroid nodule 2010   "precancerous; thyroid treated with radiation"   Tubular adenoma of colon 06/2015     Past Surgical History:  Procedure Laterality Date   CARDIAC CATHETERIZATION     unable to do cardiac cath but does stress echo  11/22/11   CARDIAC CATHETERIZATION N/A 12/04/2015   Procedure: Left Heart Cath and Coronary Angiography;  Surgeon: Adrian Prows, MD;  Location: [place] [facility];  Service: Cardiovascular;  Laterality: N/A;   CARDIAC CATHETERIZATION N/A 12/04/2015   Procedure: Intravascular Pressure Wire/FFR Study;  Surgeon: Adrian Prows, MD;  Location: [place] [facility];  Service: Cardiovascular;  Laterality: N/A;   CARDIAC CATHETERIZATION N/A 12/07/2015   Procedure: Coronary Stent Intervention;  Surgeon: Adrian Prows, MD;  Location: [place] [facility];  Service: Cardiovascular;  Laterality: N/A;   COLONOSCOPY     CORONARY STENT PLACEMENT     EYE MUSCLE SURGERY Left 1969   LAPAROSCOPIC CHOLECYSTECTOMY  2008   LEFT HEART CATH AND CORONARY ANGIOGRAPHY N/A 11/24/2017   Procedure: LEFT HEART CATH AND CORONARY ANGIOGRAPHY;  Surgeon: Nigel Mormon, MD;  Location: [place] [facility];  Service: Cardiovascular;  Laterality: N/A;   POLYPECTOMY     TUBAL LIGATION Bilateral 1995   UPPER GASTROINTESTINAL ENDOSCOPY     VENTRICULAR ABLATION SURGERY  2006,2010   times 2, follow-up for v-tack      Family History  Problem Relation Age of Onset   Hypertension Father    Prostate cancer Father    Heart attack Father    Colon cancer Father 32   Hypertension Mother    Diabetes Mother    Colon polyps Mother    Crohn's disease Daughter    Heart attack Cousin  Diabetes Maternal Grandmother    Stomach cancer Maternal Grandmother    Cancer Sister        ovarian   Colon polyps Sister    Diabetes Brother    Esophageal cancer Neg Hx    Rectal cancer Neg Hx      Social History: Social History   Socioeconomic History   Marital status: Married    Spouse name: Not on file   Number of children: 4   Years of education: Not on file   Highest education level: Not on file  Occupational History   Occupation: Geologist, engineering at [employer]  Tobacco Use   Smoking status: Never    Smokeless tobacco: Never  Vaping Use   Vaping Use: Never used  Substance and Sexual Activity   Alcohol use: No   Drug use: No   Sexual activity: Not Currently    Partners: Male    Birth control/protection: Surgical, Abstinence    Comment: BTL  Other Topics Concern   Not on file  Social History Narrative   FAMILY HISTORY   History of hypertension   History of prostate cancer 1st degree relative <50   D, S ovar. [place]      Regular exercise - YES      GYN Dr Posey Pronto - [place]   Social Determinants of Health   Financial Resource Strain: Not on file  Food Insecurity: Not on file  Transportation Needs: Not on file  Physical Activity: Not on file  Stress: Not on file  Social Connections: Not on file  Intimate Partner Violence: Not on file     (Not in a hospital admission)   Review of Systems  Cardiovascular:  Positive for chest pain. Negative for dyspnea on exertion, leg swelling, palpitations and syncope.      Physical Exam: Physical Exam Vitals and nursing note reviewed.  Constitutional:      General: She is not in acute distress. Neck:     Vascular: No JVD.  Cardiovascular:     Rate and Rhythm: Normal rate and regular rhythm.     Heart sounds: Normal heart sounds. No murmur heard. Pulmonary:     Effort: Pulmonary effort is normal.     Breath sounds: Normal breath sounds. No wheezing or rales.  Musculoskeletal:     Right lower leg: No edema.     Left lower leg: No edema.        Imaging/tests reviewed and independently interpreted: Lab Results:  Latest Reference Range & Units 06/16/22 16:53 06/16/22 19:22  Troponin I (High Sensitivity) <18 ng/L 6 4    Cardiac Studies:  Telemetry 9/22/202: No significant arrhythmia  EKG 06/16/2022: Sinus bradycardia with 1st degree A-V block Nonspecific T wave abnormality  Echocardiogram: None recently  Exercise nuclear stress test 05/2021: 1.  Good quality study.  2.  Normal EKG and hemodynamic response to exercise  stress.  Fair exercise  tolerance, 7.2 METS.  3.  Normal resting perfusion with no evidence of old infarct.  4.  Normal stress perfusion with no evidence of inducible ischemia.  5.  Normal LV systolic function with EF 72%.   Coronary angiogram 2019 [facility] Nyelli Samara, MD): LM: Normal LAD: Prox 20% disease. Patent mid LAD stent w/no restenosis.  Lcx: Mild diffuse disease RCA: Mild diffuse disease  Coronary intervention 2017 Adrian Prows, MD): Mid LAD 80-90% stenosis  PCI with 3.0X15 mm Resolute DES  Assessment & Recommendations:   56 y.o. African American female  with hypertension, hyperlipidemia, CAD s/p prior PCI, statin intolerance, admitted for chest pain.  Chest pain: Known CAD with mid LAD PCI in 2017, and subsequently normal coronary angiogram and stress tests, most recently 05/2021. Ongoing chest tightness for 16 hours with negative EKG and troponin, makes acute coronary syndrome highly unlikely. That said, patient is still having chest tightness and is on IV nitroglycerin. I reckon patient may have microvascular angina rather than true epicardial coronary artery disease causing acute coronary syndrome.  To be complete, I will check 1 more troponin since last troponin was >12 hours ago. I gave her the following options: Coronary angiogram now, versus medical management and close follow-up with Dr. Atilano Median, versus medical management for stress test inpatient. Patient opted to try medical management first.  I will discontinue IV heparin and IV nitroglycerin, and start on p.o. Imdur 60 mg daily.  We will revisit her pain in the next couple of hours.  If she has had no improvement in chest pain, I think definitive evaluation will be a coronary angiogram.  With her previous LAD stent, CT coronary angiogram will be limited.  Therefore, at that point, I would recommend a conventional invasive coronary angiogram. I will defer long-term lipid management to her primary cardiologist Dr. Atilano Median.   Could consider bempedoic acid is an option given her statin and Repatha intolerance. It appears that she has not been on aspirin recently.  Recommend aspirin 81 mg daily.  Discussed interpretation of tests and management recommendations with the primary team     Nigel Mormon, MD Pager: [phone] Office: [phone]

## 2022-06-17 NOTE — Assessment & Plan Note (Signed)
Continue home antihypertensives 

## 2022-06-17 NOTE — Assessment & Plan Note (Signed)
Continue levothyroxine 

## 2022-06-17 NOTE — H&P (View-Only) (Signed)
Still has ongoing nitrate responsive chest pain. Discussed with the patient. Would like to proceed with cath.   Nigel Mormon, MD Pager: [phone] Office: [phone]

## 2022-06-17 NOTE — Progress Notes (Signed)
Pt admitted to [facility] 20 from [facility] ED.  Pt is A&OX4 and neuro intact.  Pt placed on telemetry and CCMD notified.  Pt is currently comfortable reporting no pain of SOB.  Pt is oriented to unit with call light in reach.    06/17/22 0933  Vitals  Temp 97.8 F (36.6 C)  Temp Source Oral  BP (!) 128/98  MAP (mmHg) 108  BP Location Right Arm  BP Method Automatic  Patient Position (if appropriate) Lying  Pulse Rate (!) 52  Pulse Rate Source Monitor  ECG Heart Rate (!) 59  Resp 16  Level of Consciousness  Level of Consciousness Alert  Oxygen Therapy  SpO2 97 %  O2 Device Room Air  Pain Assessment  Pain Scale 0-10  Pain Score 0  POSS Scale (Pasero Opioid Sedation Scale)  POSS *See Group Information* 1-Acceptable,Awake and alert  Glasgow Coma Scale  Eye Opening 4  Best Verbal Response (NON-intubated) 5  Best Motor Response 6  Glasgow Coma Scale Score 15  MEWS Score  MEWS Temp 0  MEWS Systolic 0  MEWS Pulse 0  MEWS RR 0  MEWS LOC 0  MEWS Score 0  MEWS Score Color Green

## 2022-06-17 NOTE — Plan of Care (Signed)
  Problem: Activity: Goal: Ability to tolerate increased activity will improve Outcome: Progressing   

## 2022-06-17 NOTE — Discharge Instructions (Signed)
Follow with Primary MD Plotnikov, Evie Lacks, MD in 7 days   Get CBC, CMP,  checked  by Primary MD next visit.    Activity: As tolerated with Full fall precautions use walker/cane & assistance as needed   Disposition Home    Diet: Heart Healthy    On your next visit with your primary care physician please Get Medicines reviewed and adjusted.   Please request your Prim.MD to go over all Hospital Tests and Procedure/Radiological results at the follow up, please get all Hospital records sent to your Prim MD by signing hospital release before you go home.   If you experience worsening of your admission symptoms, develop shortness of breath, life threatening emergency, suicidal or homicidal thoughts you must seek medical attention immediately by calling 911 or calling your MD immediately  if symptoms less severe.  You Must read complete instructions/literature along with all the possible adverse reactions/side effects for all the Medicines you take and that have been prescribed to you. Take any new Medicines after you have completely understood and accpet all the possible adverse reactions/side effects.   Do not drive, operating heavy machinery, perform activities at heights, swimming or participation in water activities or provide baby sitting services if your were admitted for syncope or siezures until you have seen by Primary MD or a Neurologist and advised to do so again.  Do not drive when taking Pain medications.    Do not take more than prescribed Pain, Sleep and Anxiety Medications  Special Instructions: If you have smoked or chewed Tobacco  in the last 2 yrs please stop smoking, stop any regular Alcohol  and or any Recreational drug use.  Wear Seat belts while driving.   Please note  You were cared for by a hospitalist during your hospital stay. If you have any questions about your discharge medications or the care you received while you were in the hospital after you are  discharged, you can call the unit and asked to speak with the hospitalist on call if the hospitalist that took care of you is not available. Once you are discharged, your primary care physician will handle any further medical issues. Please note that NO REFILLS for any discharge medications will be authorized once you are discharged, as it is imperative that you return to your primary care physician (or establish a relationship with a primary care physician if you do not have one) for your aftercare needs so that they can reassess your need for medications and monitor your lab values.

## 2022-06-17 NOTE — Progress Notes (Signed)
TR band removed. No bleeding noted. Will monitor for 30 minutes then D/C

## 2022-06-18 MED ORDER — ISOSORBIDE MONONITRATE ER 30 MG PO TB24: 30.0000 mg | ORAL_TABLET | Freq: Every day | ORAL | 0 refills | Status: DC

## 2022-06-18 MED ORDER — ATENOLOL 25 MG PO TABS: 12.5000 mg | ORAL_TABLET | Freq: Every day | ORAL | 0 refills | Status: DC

## 2022-06-18 NOTE — Progress Notes (Signed)
Pt discharged to home with family.  Pt taken off telemetry and CCMD notified.  Pt's IVs removed.  Pt left with all of their personal belongings.  AVS documentation reviewed and sent home with Pt and all questions answered.

## 2022-06-18 NOTE — Discharge Summary (Signed)
Physician Discharge Summary  [name] YBO:[id number] DOB: 01-03-1966 DOA: 06/16/2022  PCP: Cassandria Anger, MD  Admit date: 06/16/2022 Discharge date: 06/18/2022  Admitted From: Home Disposition:  Home    Patient was not discharged yesterday, due to wristband post cath was not removed until after 11 PM, as it was placed for an extended period of time and due to some bleeding earlier in the day, so patient was monitored overnight and being discharged this a.m.  Please see discussion below under hypertension and new adjustment of medication regimen  Recommendations for Outpatient Follow-up:  Follow up with PCP in 1-2 weeks Please obtain BMP/CBC in one week To follow with her primary cardiologist at [facility]   Discharge Condition: Stable CODE STATUS:FULL Diet recommendation: Heart Healthy   Brief/Interim Summary:  [name] is a 56 y.o. female with medical history significant of CAD s/p stent, hypertension, statin intolerance, hypothyroidism, obesity , PVCs, symptomatic bradycardia who presents with chest pain and increasing shortness of breath.  Patient has intermittent chest pain at rest for the past 2 months after being taken off Repatha for intolerance of side effects.  Also has intolerance of statin.  She reports acute sharp left-sided chest pain rating to her neck today while sitting at work looking at paperwork.  Felt diaphoretic and nauseous.  Symptoms lasted about 15 minutes but currently continues to have a dull pressure chest pain.  She has been given 2 sublingual nitros in the ED with transient improvement.  -In the ED, she was bradycardic with heart rate in the 40s, normotensive.  CBC and BMP unremarkable. Troponin of 4.  Chest x-ray is negative. - EKG with sinus bradycardia and first-degree AV block.  - She was given 325 aspirin, 15 mg of IV Toradol in the ED.  Hospitalist then consulted for admission.    Chest pain ACS ruled out -History for  mid LAD stent and 11/2015, last cardiac cath in 2019 with mild nonobstructive CAD, and patent mid LAD stent with no restenosis. -Given her risk factors, she was admitted, for which she was started on heparin and nitro drip, and cardiology were consulted -status  post cardiac cath by Dr. Virgina Jock, please see results below  LM: Normal LAD: 20% prox, 20% mid disease, partly ISR Lcx: Mild diffuse disease RCA: Mild diffuse disease   Sluggish TIMI 2 flow in all epicardial coronary beds, instantly improved with IC NTG   Normal LVEDP Normal LVEF   No significant epicardial artery stenoses Suspect nitrate responsive microvascular angina Recommend addition of PO Imdur 60 mg daily Recommend Aspirin 81 mg daily, lipid reduction. Patient has been intolerant to statins and Repatha. Consider Bempedoic acid. Defer to primary cardiologist.  Faythe Ghee to discharge home this afternoon Follow up with primary cardiologist in [place]   Will add Imdur on discharge  Sinus bradycardia -Symptomatic, no malignant heart block, given soft blood pressure, and some bradycardia her atenolol dose has been decreased from 25 mg>> 12.5 mg daily   Hypothyroidism Continue levothyroxine   Essential hypertension Blood pressure has been soft after patient was started on Imdur, so Norvasc has been discontinued on discharge, atenolol has been decreased , and her Imdur was decreased from 60 to 30 mg oral daily upon discharge.   Hyperlipidemia with target LDL less than 70 Statin and Repatha intolerance. To follow with her primary cardiologist, can consider Bempedioc  Discharge Diagnoses:  Principal Problem:   Chest pain Active Problems:   Hyperlipidemia with target LDL less  than 70   Essential hypertension   Hypothyroidism   Sinus bradycardia    Discharge Instructions  Discharge Instructions     Diet - low sodium heart healthy   Complete by: As directed    Increase activity slowly   Complete by: As  directed       Allergies as of 06/18/2022       Reactions   Crestor [rosuvastatin Calcium] Other (See Comments)   Muscle aches and pains   Lipitor [atorvastatin] Other (See Comments)   Muscle aches and pains   Lisinopril Cough   Livalo [pitavastatin] Other (See Comments)   Muscle aches and pain   Repatha [evolocumab] Hives, Rash, Other (See Comments)   Back pain        Medication List     STOP taking these medications    amLODipine 5 MG tablet Commonly known as: NORVASC       TAKE these medications    acetaminophen 500 MG tablet Commonly known as: TYLENOL Take 1,000 mg by mouth as needed (pain).   aspirin EC 81 MG tablet Take 1 tablet (81 mg total) by mouth daily. Swallow whole.   atenolol 25 MG tablet Commonly known as: TENORMIN Take 0.5 tablets (12.5 mg total) by mouth daily. -- Office visit needed for further refills What changed: how much to take   bacitracin-polymyxin b ophthalmic ointment Commonly known as: POLYSPORIN Place 1 Application into the left eye every 12 (twelve) hours. apply to eye every 12 hours while awake What changed: when to take this   doxycycline 100 MG tablet Commonly known as: ADOXA Take 100 mg by mouth 2 (two) times daily.   isosorbide mononitrate 30 MG 24 hr tablet Commonly known as: IMDUR Take 1 tablet (30 mg total) by mouth daily.   neomycin-polymyxin b-dexamethasone 3.5-10000-0.1 Oint Commonly known as: MAXITROL   Vitamin D 50 MCG (2000 UT) tablet Take 2,000 Units by mouth daily.        Follow-up Information     Plotnikov, Evie Lacks, MD.   Specialty: Internal Medicine Contact information: [place] [address] [phone]         [place].   Specialty: Emergency Medicine Contact information: [address] [id number] [place] [address] [phone]               Allergies  Allergen Reactions   Crestor  [Rosuvastatin Calcium] Other (See Comments)    Muscle aches and pains   Lipitor [Atorvastatin] Other (See Comments)    Muscle aches and pains   Lisinopril Cough   Livalo [Pitavastatin] Other (See Comments)    Muscle aches and pain   Repatha [Evolocumab] Hives, Rash and Other (See Comments)    Back pain    Consultations: Cardiology Dr. Virgina Jock   Procedures/Studies: CARDIAC CATHETERIZATION  Result Date: 06/17/2022 Images from the original result were not included. LM: Normal LAD: 20% prox, 20% mid disease, partly ISR Lcx: Mild diffuse disease RCA: Mild diffuse disease Sluggish TIMI 2 flow in all epicardial coronary beds, instantly improved with IC NTG Normal LVEDP Normal LVEF No significant epicardial artery stenoses Suspect nitrate responsive microvascular angina Recommend addition of PO Imdur 60 mg daily Recommend Aspirin 81 mg daily, lipid reduction. Patient has been intolerant to statins and Repatha. Consider Bempedoic acid. Defer to primary cardiologist. Faythe Ghee to discharge home this afternoon Follow up with primary cardiologist in [place] Nigel Mormon, MD Pager: [phone] Office: [phone]  DG Chest  2 View  Result Date: 06/16/2022 CLINICAL DATA:  Chest pain radiating to neck, shortness of breath EXAM: CHEST - 2 VIEW COMPARISON:  05/29/2021 FINDINGS: Frontal and lateral views of the chest demonstrate an unremarkable cardiac silhouette. No airspace disease, effusion, or pneumothorax. No acute bony abnormalities. IMPRESSION: 1. No acute intrathoracic process. Electronically Signed   By: Randa Ngo M.D.   On: 06/16/2022 17:17   (Echo, Carotid, EGD, Colonoscopy, ERCP)    Subjective: She denies any chest pain or shortness of breath currently.  Discharge Exam: Vitals:   06/18/22 0353 06/18/22 0733  BP: (!) 108/58 121/74  Pulse: 67 (!) 54  Resp: 17 17  Temp: 97.9 F (36.6 C) 98.4 F (36.9 C)  SpO2: 100% 99%   Vitals:   06/17/22 2100 06/17/22 2254 06/18/22  0353 06/18/22 0733  BP: 113/77 108/70 (!) 108/58 121/74  Pulse: 60 62 67 (!) 54  Resp: '18 14 17 17  '$ Temp: 97.8 F (36.6 C) 98.4 F (36.9 C) 97.9 F (36.6 C) 98.4 F (36.9 C)  TempSrc: Oral Oral Oral Oral  SpO2: 98% 99% 100% 99%  Weight:      Height:        General: Pt is alert, awake, not in acute distress Cardiovascular: RRR, S1/S2 +, no rubs, no gallops Respiratory: CTA bilaterally, no wheezing, no rhonchi Abdominal: Soft, NT, ND, bowel sounds + Extremities: no edema, no cyanosis    The results of significant diagnostics from this hospitalization (including imaging, microbiology, ancillary and laboratory) are listed below for reference.     Microbiology: No results found for this or any previous visit (from the past 240 hour(s)).   Labs: BNP (last 3 results) No results for input(s): "BNP" in the last 8760 hours. Basic Metabolic Panel: Recent Labs  Lab 06/16/22 1653  NA 142  K 4.3  CL 107  CO2 24  GLUCOSE 86  BUN 18  CREATININE 0.87  CALCIUM 9.9   Liver Function Tests: No results for input(s): "AST", "ALT", "ALKPHOS", "BILITOT", "PROT", "ALBUMIN" in the last 168 hours. No results for input(s): "LIPASE", "AMYLASE" in the last 168 hours. No results for input(s): "AMMONIA" in the last 168 hours. CBC: Recent Labs  Lab 06/16/22 1653  WBC 6.8  HGB 13.9  HCT 42.6  MCV 94.2  PLT 270   Cardiac Enzymes: No results for input(s): "CKTOTAL", "CKMB", "CKMBINDEX", "TROPONINI" in the last 168 hours. BNP: Invalid input(s): "POCBNP" CBG: No results for input(s): "GLUCAP" in the last 168 hours. D-Dimer No results for input(s): "DDIMER" in the last 72 hours. Hgb A1c No results for input(s): "HGBA1C" in the last 72 hours. Lipid Profile Recent Labs    06/17/22 0105  CHOL 235*  HDL 62  LDLCALC 158*  TRIG 74  CHOLHDL 3.8   Thyroid function studies No results for input(s): "TSH", "T4TOTAL", "T3FREE", "THYROIDAB" in the last 72 hours.  Invalid input(s):  "FREET3" Anemia work up No results for input(s): "VITAMINB12", "FOLATE", "FERRITIN", "TIBC", "IRON", "RETICCTPCT" in the last 72 hours. Urinalysis    Component Value Date/Time   COLORURINE YELLOW 03/19/2019 0815   APPEARANCEUR CLEAR 03/19/2019 0815   LABSPEC 1.010 03/19/2019 0815   PHURINE 6.5 03/19/2019 0815   GLUCOSEU NEGATIVE 03/19/2019 0815   HGBUR NEGATIVE 03/19/2019 0815   HGBUR negative 08/08/2008 1350   BILIRUBINUR NEGATIVE 03/19/2019 0815   BILIRUBINUR neg 10/28/2016 0823   KETONESUR NEGATIVE 03/19/2019 0815   PROTEINUR NEGATIVE 12/01/2017 1628   UROBILINOGEN 0.2 03/19/2019 0815   NITRITE NEGATIVE 03/19/2019  [place] 03/19/2019 0815   Sepsis Labs Recent Labs  Lab 06/16/22 1653  WBC 6.8   Microbiology No results found for this or any previous visit (from the past 240 hour(s)).   Time coordinating discharge: Over 30 minutes  SIGNED:   Phillips Climes, MD  [facility] 06/18/2022, 10:16 AM Pager   If 7PM-7AM, please contact night-coverage [email or web address]

## 2022-06-18 NOTE — Progress Notes (Signed)
HOSPITAL MEDICINE OVERNIGHT EVENT NOTE    Notified by nursing that patient's TR band had to be in place for an extended period of time due to some bleeding earlier in the day.  As a result, band was not removed until after 11 PM and at this point patient and family declined to be discharged this late in the evening despite best attempts by nursing to convince them otherwise.  Patient will have to be discharged in the morning.  Elizabeth Emerald  MD [facility]

## 2022-06-24 ENCOUNTER — Telehealth: Payer: Self-pay

## 2022-06-24 NOTE — Telephone Encounter (Signed)
Unable to LVM asking patient if she has received her mammogram. If so needing the information of where it was performed.

## 2022-07-08 ENCOUNTER — Telehealth: Payer: Self-pay | Admitting: *Deleted

## 2022-07-08 NOTE — Telephone Encounter (Signed)
Transition Care Management Follow-up Telephone Call  Date of discharge and from where: 07/07/22 Pt state she was in the hosp too back in September but its dealing with her heart. They wanted her to f/u w/ cardiologist who she states is with [facility]. She actually went to ED 07/07/22 because she was having chest discomfort while at work. She does not need to see Dr. Alain Marion she states. Have her f/u appt w/ her cardiologist.../lmb

## 2022-09-01 ENCOUNTER — Ambulatory Visit: Payer: PRIVATE HEALTH INSURANCE | Admitting: Obstetrics and Gynecology

## 2022-09-01 ENCOUNTER — Other Ambulatory Visit (INDEPENDENT_AMBULATORY_CARE_PROVIDER_SITE_OTHER): Payer: PRIVATE HEALTH INSURANCE

## 2022-09-01 ENCOUNTER — Encounter: Payer: Self-pay | Admitting: Obstetrics and Gynecology

## 2022-09-01 ENCOUNTER — Ambulatory Visit: Payer: PRIVATE HEALTH INSURANCE | Admitting: Internal Medicine

## 2022-09-01 ENCOUNTER — Other Ambulatory Visit (HOSPITAL_COMMUNITY)
Admission: RE | Admit: 2022-09-01 | Discharge: 2022-09-01 | Disposition: A | Discharge status: Home / Self Care | Payer: PRIVATE HEALTH INSURANCE | Source: Ambulatory Visit | Attending: Obstetrics and Gynecology | Admitting: Obstetrics and Gynecology

## 2022-09-01 ENCOUNTER — Encounter: Payer: Self-pay | Admitting: Internal Medicine

## 2022-09-01 VITALS — BP 128/80 | HR 80 | Ht 62.0 in | Wt 173.0 lb

## 2022-09-01 VITALS — BP 122/82 | HR 61 | Temp 97.8°F | Ht 62.0 in | Wt 170.8 lb

## 2022-09-01 DIAGNOSIS — Z8041 Family history of malignant neoplasm of ovary: Secondary | ICD-10-CM

## 2022-09-01 DIAGNOSIS — R7309 Other abnormal glucose: Secondary | ICD-10-CM

## 2022-09-01 DIAGNOSIS — I2510 Atherosclerotic heart disease of native coronary artery without angina pectoris: Secondary | ICD-10-CM

## 2022-09-01 DIAGNOSIS — E785 Hyperlipidemia, unspecified: Secondary | ICD-10-CM | POA: Diagnosis not present

## 2022-09-01 DIAGNOSIS — Z809 Family history of malignant neoplasm, unspecified: Secondary | ICD-10-CM | POA: Diagnosis not present

## 2022-09-01 DIAGNOSIS — M546 Pain in thoracic spine: Secondary | ICD-10-CM

## 2022-09-01 DIAGNOSIS — G8929 Other chronic pain: Secondary | ICD-10-CM

## 2022-09-01 DIAGNOSIS — Z124 Encounter for screening for malignant neoplasm of cervix: Secondary | ICD-10-CM | POA: Diagnosis present

## 2022-09-01 DIAGNOSIS — Z23 Encounter for immunization: Secondary | ICD-10-CM

## 2022-09-01 DIAGNOSIS — M542 Cervicalgia: Secondary | ICD-10-CM

## 2022-09-01 DIAGNOSIS — I10 Essential (primary) hypertension: Secondary | ICD-10-CM

## 2022-09-01 DIAGNOSIS — N62 Hypertrophy of breast: Secondary | ICD-10-CM | POA: Insufficient documentation

## 2022-09-01 DIAGNOSIS — Z01419 Encounter for gynecological examination (general) (routine) without abnormal findings: Secondary | ICD-10-CM | POA: Diagnosis not present

## 2022-09-01 LAB — CBC WITH DIFFERENTIAL/PLATELET
Basophils Absolute: 0.1 10*3/uL (ref 0.0–0.1)
Basophils Relative: 1.3 % (ref 0.0–3.0)
Eosinophils Absolute: 0.1 10*3/uL (ref 0.0–0.7)
Eosinophils Relative: 1.3 % (ref 0.0–5.0)
HCT: 41.3 % (ref 36.0–46.0)
Hemoglobin: 14.2 g/dL (ref 12.0–15.0)
Lymphocytes Relative: 42.3 % (ref 12.0–46.0)
Lymphs Abs: 2.6 10*3/uL (ref 0.7–4.0)
MCHC: 34.3 g/dL (ref 30.0–36.0)
MCV: 92.8 fl (ref 78.0–100.0)
Monocytes Absolute: 0.4 10*3/uL (ref 0.1–1.0)
Monocytes Relative: 5.8 % (ref 3.0–12.0)
Neutro Abs: 3 10*3/uL (ref 1.4–7.7)
Neutrophils Relative %: 49.3 % (ref 43.0–77.0)
Platelets: 314 10*3/uL (ref 150.0–400.0)
RBC: 4.45 Mil/uL (ref 3.87–5.11)
RDW: 13.3 % (ref 11.5–15.5)
WBC: 6.2 10*3/uL (ref 4.0–10.5)

## 2022-09-01 LAB — HEMOGLOBIN A1C: Hgb A1c MFr Bld: 5.6 % (ref 4.6–6.5)

## 2022-09-01 LAB — URINALYSIS
Bilirubin Urine: NEGATIVE
Hgb urine dipstick: NEGATIVE
Leukocytes,Ua: NEGATIVE
Nitrite: NEGATIVE
Specific Gravity, Urine: 1.01 (ref 1.000–1.030)
Total Protein, Urine: NEGATIVE
Urine Glucose: NEGATIVE
Urobilinogen, UA: 0.2 (ref 0.0–1.0)
pH: 7 (ref 5.0–8.0)

## 2022-09-01 MED ORDER — NAPROXEN 500 MG PO TABS: 500.0000 mg | ORAL_TABLET | Freq: Two times a day (BID) | ORAL | 1 refills | Status: DC | PRN

## 2022-09-01 NOTE — Patient Instructions (Addendum)
Read about K2 and MK7 and also nexlizet tablets

## 2022-09-01 NOTE — Assessment & Plan Note (Signed)
Check A1c. 

## 2022-09-01 NOTE — Progress Notes (Signed)
Subjective:  Patient ID: [name], female    DOB: Dec 08, 1965  Age: 56 y.o. MRN: [id number]  CC: Back Pain (Pt states she want to discuss w/MD abt getting a letter stating she need breast reduction. Her breast is making her back and shoulder hurt) and Shoulder Pain   HPI JERICA CREEGAN presents for Neck pain and thoracic back pain x 3 years.  C/o wt gain wt during menopause and went up 3 sizes on her bra. Now her bra size is 42D. Pt had  breast exam by her GYN, . Dr Quincy Simmonds. Mammo - pending.  C/o bruises under bra straps.  Outpatient Medications Prior to Visit  Medication Sig Dispense Refill   acetaminophen (TYLENOL) 500 MG tablet Take 1,000 mg by mouth as needed (pain).     amLODipine (NORVASC) 10 MG tablet Take 10 mg by mouth daily.     aspirin EC 81 MG tablet Take 1 tablet (81 mg total) by mouth daily. Swallow whole. 150 tablet 2   Cholecalciferol (VITAMIN D) 2000 units tablet Take 2,000 Units by mouth daily.     nitroGLYCERIN (NITROSTAT) 0.4 MG SL tablet Place 0.4 mg under the tongue every 5 (five) minutes as needed.     ranolazine (RANEXA) 500 MG 12 hr tablet Take by mouth.     No facility-administered medications prior to visit.    ROS: Review of Systems  Constitutional:  Negative for activity change, appetite change, chills, fatigue and unexpected weight change.  HENT:  Negative for congestion, mouth sores and sinus pressure.   Eyes:  Negative for visual disturbance.  Respiratory:  Negative for cough and chest tightness.   Gastrointestinal:  Negative for abdominal pain and nausea.  Genitourinary:  Negative for difficulty urinating, frequency and vaginal pain.  Musculoskeletal:  Positive for arthralgias, back pain, neck pain and neck stiffness. Negative for gait problem.  Skin:  Negative for pallor and rash.  Neurological:  Negative for dizziness, tremors, weakness, numbness and headaches.  Psychiatric/Behavioral:  Negative for confusion and sleep disturbance.      Objective:  BP 122/82 (BP Location: Left Arm)   Pulse 61   Temp 97.8 F (36.6 C) (Oral)   Ht '5\' 2"'$  (1.575 m)   Wt 170 lb 12.8 oz (77.5 kg)   LMP 11/17/2017   SpO2 96%   BMI 31.24 kg/m   BP Readings from Last 3 Encounters:  09/01/22 122/82  09/01/22 128/80  06/18/22 122/73    Wt Readings from Last 3 Encounters:  09/01/22 170 lb 12.8 oz (77.5 kg)  09/01/22 173 lb (78.5 kg)  06/16/22 178 lb (80.7 kg)    Physical Exam Constitutional:      General: She is not in acute distress.    Appearance: She is well-developed. She is obese.  HENT:     Head: Normocephalic.     Right Ear: External ear normal.     Left Ear: External ear normal.     Nose: Nose normal.  Eyes:     General:        Right eye: No discharge.        Left eye: No discharge.     Conjunctiva/sclera: Conjunctivae normal.     Pupils: Pupils are equal, round, and reactive to light.  Neck:     Thyroid: No thyromegaly.     Vascular: No JVD.     Trachea: No tracheal deviation.  Cardiovascular:     Rate and Rhythm: Normal rate and regular rhythm.  Heart sounds: Normal heart sounds.  Pulmonary:     Effort: No respiratory distress.     Breath sounds: No stridor. No wheezing.  Abdominal:     General: Bowel sounds are normal. There is no distension.     Palpations: Abdomen is soft. There is no mass.     Tenderness: There is no abdominal tenderness. There is no guarding or rebound.  Musculoskeletal:        General: No tenderness.     Cervical back: Normal range of motion and neck supple. No rigidity.  Lymphadenopathy:     Cervical: No cervical adenopathy.  Skin:    Findings: No erythema or rash.  Neurological:     Cranial Nerves: No cranial nerve deficit.     Motor: No abnormal muscle tone.     Coordination: Coordination normal.     Deep Tendon Reflexes: Reflexes normal.  Psychiatric:        Behavior: Behavior normal.        Thought Content: Thought content normal.        Judgment: Judgment normal.   Neck and thor spine - muscles w/pain, hard    A total time of 45 minutes was spent preparing to see the patient, reviewing tests, x-rays, operative reports and other medical records.  Also, obtaining history and performing comprehensive physical exam.  Additionally, counseling the patient regarding the above listed issues.   Finally, documenting clinical information in the health records, coordination of care, educating the patient re CAD, lipids/statin intolerance treatment options, neck pain.    Lab Results  Component Value Date   WBC 6.8 06/16/2022   HGB 13.9 06/16/2022   HCT 42.6 06/16/2022   PLT 270 06/16/2022   GLUCOSE 86 06/16/2022   CHOL 235 (H) 06/17/2022   TRIG 74 06/17/2022   HDL 62 06/17/2022   LDLCALC 158 (H) 06/17/2022   ALT 10 05/29/2021   AST 25 05/29/2021   NA 142 06/16/2022   K 4.3 06/16/2022   CL 107 06/16/2022   CREATININE 0.87 06/16/2022   BUN 18 06/16/2022   CO2 24 06/16/2022   TSH 4.11 03/19/2019   INR 1.0 07/11/2020   HGBA1C 5.6 12/04/2015    CARDIAC CATHETERIZATION  Result Date: 06/17/2022 Images from the original result were not included. LM: Normal LAD: 20% prox, 20% mid disease, partly ISR Lcx: Mild diffuse disease RCA: Mild diffuse disease Sluggish TIMI 2 flow in all epicardial coronary beds, instantly improved with IC NTG Normal LVEDP Normal LVEF No significant epicardial artery stenoses Suspect nitrate responsive microvascular angina Recommend addition of PO Imdur 60 mg daily Recommend Aspirin 81 mg daily, lipid reduction. Patient has been intolerant to statins and Repatha. Consider Bempedoic acid. Defer to primary cardiologist. Faythe Ghee to discharge home this afternoon Follow up with primary cardiologist in [place] Nigel Mormon, MD Pager: [phone] Office: [phone]  DG Chest 2 View  Result Date: 06/16/2022 CLINICAL DATA:  Chest pain radiating to neck, shortness of breath EXAM: CHEST - 2 VIEW COMPARISON:  05/29/2021 FINDINGS: Frontal  and lateral views of the chest demonstrate an unremarkable cardiac silhouette. No airspace disease, effusion, or pneumothorax. No acute bony abnormalities. IMPRESSION: 1. No acute intrathoracic process. Electronically Signed   By: Randa Ngo M.D.   On: 06/16/2022 17:17    Assessment & Plan:   Problem List Items Addressed This Visit     CAD (coronary artery disease), native coronary artery    On Norvasc, Ranexa Statin intolerant  Read about K2 and  MK7 and also nexlizet tablets      Relevant Medications   nitroGLYCERIN (NITROSTAT) 0.4 MG SL tablet   amLODipine (NORVASC) 10 MG tablet   Other Relevant Orders   TSH   Urinalysis   CBC with Differential/Platelet   Lipid panel   Comprehensive metabolic panel   Hemoglobin A1c   Cervical pain (neck) - Primary    Worse MSK neck pain and thoracic back pain x 3 years. Naproxen prn C/o wt gain wt during menopause and went up 3 sizes on her bra. Now her bra size is 42D. Pt had  breast exam by her GYN, . Dr Quincy Simmonds. Mammo - pending.  C/o bruises under bra straps. Plastic Surgery ref - Dr Marla Roe      Relevant Orders   Ambulatory referral to Plastic Surgery   Essential hypertension    On Norvasc, Ranexa      Relevant Medications   nitroGLYCERIN (NITROSTAT) 0.4 MG SL tablet   amLODipine (NORVASC) 10 MG tablet   Other Relevant Orders   TSH   Urinalysis   CBC with Differential/Platelet   Lipid panel   Comprehensive metabolic panel   Hemoglobin A1c   HYPERGLYCEMIA    Check A1c      Hyperlipidemia with target LDL less than 70   Relevant Medications   nitroGLYCERIN (NITROSTAT) 0.4 MG SL tablet   amLODipine (NORVASC) 10 MG tablet   Other Relevant Orders   TSH   Lipid panel   Large breasts    Worsening MSK neck pain and thoracic back pain x 3 years. C/o bruises under bra straps. Plastic Surgery ref - Dr Marla Roe      Relevant Orders   Ambulatory referral to Plastic Surgery   Thoracic back pain    Worse - MSK neck  pain and thoracic back pain x 3 years. Naproxen prn  C/o wt gain wt during menopause and went up 3 sizes on her bra. Now her bra size is 42D. Pt had  breast exam by her GYN, . Dr Quincy Simmonds. Mammo - pending.  C/o bruises under bra straps. Plastic Surgery ref - Dr Marla Roe      Relevant Medications   naproxen (NAPROSYN) 500 MG tablet   Other Relevant Orders   Hemoglobin A1c      Meds ordered this encounter  Medications   naproxen (NAPROSYN) 500 MG tablet    Sig: Take 1 tablet (500 mg total) by mouth 2 (two) times daily as needed for moderate pain (neck pain).    Dispense:  30 tablet    Refill:  1      Follow-up: Return in about 4 months (around 01/01/2023) for Wellness Exam.  Walker Kehr, MD

## 2022-09-01 NOTE — Assessment & Plan Note (Signed)
Worsening MSK neck pain and thoracic back pain x 3 years. C/o bruises under bra straps. Plastic Surgery ref - Dr Marla Roe

## 2022-09-01 NOTE — Progress Notes (Signed)
56 y.o. [id number] Married [place] American female here for annual exam.  She has bilateral breast pain and would like to discuss breast reduction procedure.  She notices her breast size has increased significantly in menopause.  Has gone up 3 bra sizes.  Having shoulder and upper and middle back pain.  She is having bruising from her bra straps.  Has been fitted for the bra.  Difficulty with exercise and has breast pain due to the size of her breasts.  She has a mole on her breast also.  She saw a dermatologist two years ago.   PCP:   Dr. Alain Marion.   Patient's last menstrual period was 11/17/2017.           Sexually active: Yes.    The current method of family planning is none.    Exercising: Yes.    Walking Smoker:  no  Health Maintenance: Pap:  2018 per pt,  2008 normal History of abnormal Pap:  no MMG:  2021 per pt.  Goes to [facility] in [place].  Colonoscopy:  02/12/2021.  Due in 2027. BMD:   n/a TDaP:  01/25/2012 Gardasil:   no Screening Labs:  PCP Flu vaccine:  completed. Covid vaccine:  discussed.   reports that she has never smoked. She has never used smokeless tobacco. She reports that she does not drink alcohol and does not use drugs.  Past Medical History:  Diagnosis Date   Acute pancreatitis 2011   Allergy    Anemia    CAD (coronary artery disease) 2009   Dr Olegario Shearer [facility], Heart Cath   Family history of adverse reaction to anesthesia    "mother and brother have difficulty waking up out from it"   GERD (gastroesophageal reflux disease)    Hyperlipidemia    Hypertension    Hypothyroidism    "recently dx'd" (12/07/2015)   PVC (premature ventricular contraction)    Statin intolerance    STD (sexually transmitted disease)    CHL 20 yrs ago   Thyroid nodule 2010   "precancerous; thyroid treated with radiation"   Tubular adenoma of colon 06/2015    Past Surgical History:  Procedure Laterality Date   CARDIAC CATHETERIZATION     unable to do  cardiac cath but does stress echo 11/22/11   CARDIAC CATHETERIZATION N/A 12/04/2015   Procedure: Left Heart Cath and Coronary Angiography;  Surgeon: Adrian Prows, MD;  Location: [facility];  Service: Cardiovascular;  Laterality: N/A;   CARDIAC CATHETERIZATION N/A 12/04/2015   Procedure: Intravascular Pressure Wire/FFR Study;  Surgeon: Adrian Prows, MD;  Location: [facility];  Service: Cardiovascular;  Laterality: N/A;   CARDIAC CATHETERIZATION N/A 12/07/2015   Procedure: Coronary Stent Intervention;  Surgeon: Adrian Prows, MD;  Location: [facility];  Service: Cardiovascular;  Laterality: N/A;   COLONOSCOPY     CORONARY STENT PLACEMENT     EYE MUSCLE SURGERY Left 1969   LAPAROSCOPIC CHOLECYSTECTOMY  2008   LEFT HEART CATH AND CORONARY ANGIOGRAPHY N/A 11/24/2017   Procedure: LEFT HEART CATH AND CORONARY ANGIOGRAPHY;  Surgeon: Nigel Mormon, MD;  Location: [facility];  Service: Cardiovascular;  Laterality: N/A;   LEFT HEART CATH AND CORONARY ANGIOGRAPHY N/A 06/17/2022   Procedure: LEFT HEART CATH AND CORONARY ANGIOGRAPHY;  Surgeon: Nigel Mormon, MD;  Location: [facility];  Service: Cardiovascular;  Laterality: N/A;   POLYPECTOMY     TUBAL LIGATION Bilateral 1995   UPPER GASTROINTESTINAL ENDOSCOPY  VENTRICULAR ABLATION SURGERY  2006,2010   times 2, follow-up for v-tack    Current Outpatient Medications  Medication Sig Dispense Refill   acetaminophen (TYLENOL) 500 MG tablet Take 1,000 mg by mouth as needed (pain).     aspirin EC 81 MG tablet Take 1 tablet (81 mg total) by mouth daily. Swallow whole. 150 tablet 2   Cholecalciferol (VITAMIN D) 2000 units tablet Take 2,000 Units by mouth daily.     ranolazine (RANEXA) 500 MG 12 hr tablet Take by mouth.     No current facility-administered medications for this visit.    Family History  Problem Relation Age of Onset   Hypertension Father    Prostate cancer Father    Heart attack Father    Colon  cancer Father 40   Hypertension Mother    Diabetes Mother    Colon polyps Mother    Crohn's disease Daughter    Heart attack Cousin    Diabetes Maternal Grandmother    Stomach cancer Maternal Grandmother    Cancer Sister        ovarian   Colon polyps Sister    Diabetes Brother    Esophageal cancer Neg Hx    Rectal cancer Neg Hx     Review of Systems  Musculoskeletal:        Breast pain    Exam:   BP 128/80 (BP Location: Right Arm, Patient Position: Sitting, Cuff Size: Normal)   Pulse 80   Ht '5\' 2"'$  (1.575 m) Comment: delcined to remove shoes  Wt 173 lb (78.5 kg)   LMP 11/17/2017   BMI 31.64 kg/m     General appearance: alert, cooperative and appears stated age Head: normocephalic, without obvious abnormality, atraumatic Neck: no adenopathy, supple, symmetrical, trachea midline and thyroid normal to inspection and palpation Lungs: clear to auscultation bilaterally Breasts:  Enlarged breasts, no masses or tenderness, No nipple retraction or dimpling, No nipple discharge or bleeding, No axillary adenopathy.  4 mm grey scaly mole of the right lateral breast.  Heart: regular rate and rhythm Abdomen: soft, non-tender; no masses, no organomegaly Extremities: extremities normal, atraumatic, no cyanosis or edema Skin: skin color, texture, turgor normal. No rashes or lesions Lymph nodes: cervical, supraclavicular, and axillary nodes normal. Neurologic: grossly normal  Pelvic: External genitalia:  no lesions              No abnormal inguinal nodes palpated.              Urethra:  normal appearing urethra with no masses, tenderness or lesions              Bartholins and Skenes: normal                 Vagina: normal appearing vagina with normal color and discharge, no lesions              Cervix: no lesions              Pap taken: yes Bimanual Exam:  Uterus:  normal size, contour, position, consistency, mobility, non-tender              Adnexa: no mass, fullness, tenderness               Rectal exam: yes.  Confirms.              Anus:  normal sphincter tone, no lesions  Chaperone was present for exam:  Kimalexis  Assessment:   Well woman visit with  gynecologic exam. Cervical cancer screening. Hx fibroids.  Sister with ovarian cancer.  Father with prostate and colon cancer.  Macromastia. CAD. Nevus of the breast.  Possible seborrheic keratosis.   Plan: Mammogram screening discussed.  She will schedule.  Self breast awareness reviewed. Pap and HR HPV collected. Guidelines for Calcium, Vitamin D, regular exercise program including cardiovascular and weight bearing exercise. Referral for genetic counseling and testing.  Return for pelvic US and follow up.  Tdap vaccine.   Eventual referral to plastic surgeon after her mammogram and genetic counseling and testing are completed.  She will see her dermatologist for a skin check.  Follow up annually and prn.   After visit summary provided.

## 2022-09-01 NOTE — Patient Instructions (Signed)

## 2022-09-01 NOTE — Assessment & Plan Note (Addendum)
Worse MSK neck pain and thoracic back pain x 3 years. Naproxen prn C/o wt gain wt during menopause and went up 3 sizes on her bra. Now her bra size is 42D. Pt had  breast exam by her GYN, . Dr Quincy Simmonds. Mammo - pending.  C/o bruises under bra straps. Plastic Surgery ref - Dr Marla Roe

## 2022-09-01 NOTE — Assessment & Plan Note (Addendum)
Worse - MSK neck pain and thoracic back pain x 3 years. Naproxen prn  C/o wt gain wt during menopause and went up 3 sizes on her bra. Now her bra size is 42D. Pt had  breast exam by her GYN, . Dr Quincy Simmonds. Mammo - pending.  C/o bruises under bra straps. Plastic Surgery ref - Dr Marla Roe

## 2022-09-01 NOTE — Assessment & Plan Note (Signed)
On Norvasc, Ranexa

## 2022-09-01 NOTE — Assessment & Plan Note (Addendum)
On Norvasc, Ranexa Statin intolerant  Read about K2 and MK7 and also nexlizet tablets

## 2022-09-02 LAB — TSH: TSH: 21.37 u[IU]/mL — ABNORMAL HIGH (ref 0.35–5.50)

## 2022-09-02 LAB — COMPREHENSIVE METABOLIC PANEL
ALT: 11 U/L (ref 0–35)
AST: 15 U/L (ref 0–37)
Albumin: 4.7 g/dL (ref 3.5–5.2)
Alkaline Phosphatase: 49 U/L (ref 39–117)
BUN: 10 mg/dL (ref 6–23)
CO2: 28 mEq/L (ref 19–32)
Calcium: 9.9 mg/dL (ref 8.4–10.5)
Chloride: 102 mEq/L (ref 96–112)
Creatinine, Ser: 0.91 mg/dL (ref 0.40–1.20)
GFR: 70.57 mL/min (ref >60.00)
Glucose, Bld: 85 mg/dL (ref 70–99)
Potassium: 3.6 mEq/L (ref 3.5–5.1)
Sodium: 141 mEq/L (ref 135–145)
Total Bilirubin: 0.7 mg/dL (ref 0.2–1.2)
Total Protein: 8.3 g/dL (ref 6.0–8.3)

## 2022-09-02 LAB — LIPID PANEL
Cholesterol: 263 mg/dL — ABNORMAL HIGH (ref 0–200)
HDL: 82.3 mg/dL (ref >39.00)
LDL Cholesterol: 163 mg/dL — ABNORMAL HIGH (ref 0–99)
NonHDL: 180.98
Total CHOL/HDL Ratio: 3
Triglycerides: 88 mg/dL (ref 0.0–149.0)
VLDL: 17.6 mg/dL (ref 0.0–40.0)

## 2022-09-06 LAB — CYTOLOGY - PAP
Comment: NEGATIVE
Diagnosis: NEGATIVE
High risk HPV: NEGATIVE

## 2022-09-29 NOTE — Progress Notes (Deleted)
GYNECOLOGY  VISIT   HPI: 57 y.o.   Married  [place] American  female   [phone] with Patient's last menstrual period was 11/17/2017.   here for   U/S consult  GYNECOLOGIC HISTORY: Patient's last menstrual period was 11/17/2017. Contraception:  post menopausal Menopausal hormone therapy:  n/a Last mammogram:  08/31/16 Breast Density Category B, BI-RADS CATEGORY 1 Negative Last pap smear:   09/01/22 negative: HR HPV negative, 08/13/07 normal        OB History     Gravida  4   Para  4   Term  4   Preterm      AB      Living  4      SAB      IAB      Ectopic      Multiple      Live Births  4              Patient Active Problem List   Diagnosis Date Noted   Cervical pain (neck) 09/01/2022   Large breasts 09/01/2022   Sinus bradycardia 06/17/2022   Thoracic back pain 06/16/2022   Nausea 10/06/2021   Rectal bleeding 10/06/2021   Statin intolerance 10/04/2021   Unstable angina (HCC) 07/11/2020   Synovitis of left foot 02/05/2020   Arthritis of first metatarsophalangeal (MTP) joint of left foot 01/27/2020   Headache 12/05/2019   Arthralgia 03/19/2019   Right-sided chest wall pain 01/11/2019   Upper respiratory infection 06/08/2018   PVC (premature ventricular contraction) 02/13/2018   Hypothyroidism 12/13/2017   Palpitations 12/28/2015   S/P primary angioplasty with coronary stent 12/07/2015   CAD (coronary artery disease), native coronary artery 12/06/2015   Non-cardiac chest pain 12/04/2015   Abdominal pain, epigastric 12/04/2015   Bladder pain 05/14/2015   Incomplete bladder emptying 05/14/2015   Occipital neuralgia of left side 11/14/2013   Aspiration into respiratory tract 04/03/2012   Cough due to bronchospasm 04/03/2012   Chest pain, atypical 04/03/2012   HYPERGLYCEMIA 06/16/2010   BACK PAIN, LUMBAR 05/27/2009   THYROID NODULE, RIGHT 05/04/2009   Hyperlipidemia with target LDL less than 70 12/31/2008   H/O acute pancreatitis 12/31/2008   LEG  PAIN, BILATERAL 12/31/2008   PARESTHESIA 12/31/2008   EDEMA 12/31/2008   History of disease 12/31/2008   RLQ abdominal pain 10/02/2007   GERD 09/17/2007   DYSPNEA 08/28/2007   Dysphagia 08/28/2007   Essential hypertension 04/24/2007    Past Medical History:  Diagnosis Date   Acute pancreatitis 2011   Allergy    Anemia    CAD (coronary artery disease) 2009   Dr Olegario Shearer [facility] Heart Cath   Family history of adverse reaction to anesthesia    "mother and brother have difficulty waking up out from it"   GERD (gastroesophageal reflux disease)    Hyperlipidemia    Hypertension    Hypothyroidism    "recently dx'd" (12/07/2015)   PVC (premature ventricular contraction)    Statin intolerance    STD (sexually transmitted disease)    CHL 20 yrs ago   Thyroid nodule 2010   "precancerous; thyroid treated with radiation"   Tubular adenoma of colon 06/2015    Past Surgical History:  Procedure Laterality Date   CARDIAC CATHETERIZATION     unable to do cardiac cath but does stress echo 11/22/11   CARDIAC CATHETERIZATION N/A 12/04/2015   Procedure: Left Heart Cath and Coronary Angiography;  Surgeon: Adrian Prows, MD;  Location: [facility];  Service: Cardiovascular;  Laterality: N/A;   CARDIAC CATHETERIZATION N/A 12/04/2015   Procedure: Intravascular Pressure Wire/FFR Study;  Surgeon: Adrian Prows, MD;  Location: [facility];  Service: Cardiovascular;  Laterality: N/A;   CARDIAC CATHETERIZATION N/A 12/07/2015   Procedure: Coronary Stent Intervention;  Surgeon: Adrian Prows, MD;  Location: [facility];  Service: Cardiovascular;  Laterality: N/A;   COLONOSCOPY     CORONARY STENT PLACEMENT     EYE MUSCLE SURGERY Left 1969   LAPAROSCOPIC CHOLECYSTECTOMY  2008   LEFT HEART CATH AND CORONARY ANGIOGRAPHY N/A 11/24/2017   Procedure: LEFT HEART CATH AND CORONARY ANGIOGRAPHY;  Surgeon: Nigel Mormon, MD;  Location: [facility];  Service: Cardiovascular;  Laterality: N/A;    LEFT HEART CATH AND CORONARY ANGIOGRAPHY N/A 06/17/2022   Procedure: LEFT HEART CATH AND CORONARY ANGIOGRAPHY;  Surgeon: Nigel Mormon, MD;  Location: [facility];  Service: Cardiovascular;  Laterality: N/A;   POLYPECTOMY     TUBAL LIGATION Bilateral 1995   UPPER GASTROINTESTINAL ENDOSCOPY     VENTRICULAR ABLATION SURGERY  2006,2010   times 2, follow-up for v-tack    Current Outpatient Medications  Medication Sig Dispense Refill   acetaminophen (TYLENOL) 500 MG tablet Take 1,000 mg by mouth as needed (pain).     amLODipine (NORVASC) 10 MG tablet Take 10 mg by mouth daily.     aspirin EC 81 MG tablet Take 1 tablet (81 mg total) by mouth daily. Swallow whole. 150 tablet 2   Cholecalciferol (VITAMIN D) 2000 units tablet Take 2,000 Units by mouth daily.     naproxen (NAPROSYN) 500 MG tablet Take 1 tablet (500 mg total) by mouth 2 (two) times daily as needed for moderate pain (neck pain). 30 tablet 1   nitroGLYCERIN (NITROSTAT) 0.4 MG SL tablet Place 0.4 mg under the tongue every 5 (five) minutes as needed.     ranolazine (RANEXA) 500 MG 12 hr tablet Take by mouth.     No current facility-administered medications for this visit.     ALLERGIES: Crestor [rosuvastatin calcium], Lipitor [atorvastatin], Lisinopril, Livalo [pitavastatin], and Repatha [evolocumab]  Family History  Problem Relation Age of Onset   Hypertension Father    Prostate cancer Father    Heart attack Father    Colon cancer Father 52   Hypertension Mother    Diabetes Mother    Colon polyps Mother    Crohn's disease Daughter    Heart attack Cousin    Diabetes Maternal Grandmother    Stomach cancer Maternal Grandmother    Cancer Sister        ovarian   Colon polyps Sister    Diabetes Brother    Esophageal cancer Neg Hx    Rectal cancer Neg Hx     Social History   Socioeconomic History   Marital status: Married    Spouse name: Not on file   Number of children: 4   Years of education: Not on  file   Highest education level: Not on file  Occupational History   Occupation: Geologist, engineering at [employer]  Tobacco Use   Smoking status: Never   Smokeless tobacco: Never  Vaping Use   Vaping Use: Never used  Substance and Sexual Activity   Alcohol use: No   Drug use: No   Sexual activity: Not Currently    Partners: Male    Birth control/protection: Surgical, Abstinence    Comment: BTL  Other Topics Concern  Not on file  Social History Narrative   FAMILY HISTORY   History of hypertension   History of prostate cancer 1st degree relative <50   D, S ovar. [place]      Regular exercise - YES      GYN Dr Posey Pronto - [place]   Social Determinants of Health   Financial Resource Strain: Not on file  Food Insecurity: No Food Insecurity (06/18/2022)   Hunger Vital Sign    Worried About Running Out of Food in the Last Year: Never true    Ran Out of Food in the Last Year: Never true  Transportation Needs: No Transportation Needs (06/18/2022)   PRAPARE - Hydrologist (Medical): No    Lack of Transportation (Non-Medical): No  Physical Activity: Not on file  Stress: Not on file  Social Connections: Not on file  Intimate Partner Violence: Not At Risk (06/18/2022)   Humiliation, Afraid, Rape, and Kick questionnaire    Fear of Current or Ex-Partner: No    Emotionally Abused: No    Physically Abused: No    Sexually Abused: No    Review of Systems  PHYSICAL EXAMINATION:    LMP 11/17/2017     General appearance: alert, cooperative and appears stated age Head: Normocephalic, without obvious abnormality, atraumatic Neck: no adenopathy, supple, symmetrical, trachea midline and thyroid normal to inspection and palpation Lungs: clear to auscultation bilaterally Breasts: normal appearance, no masses or tenderness, No nipple retraction or dimpling, No nipple discharge or bleeding, No axillary or supraclavicular adenopathy Heart:  regular rate and rhythm Abdomen: soft, non-tender, no masses,  no organomegaly Extremities: extremities normal, atraumatic, no cyanosis or edema Skin: Skin color, texture, turgor normal. No rashes or lesions Lymph nodes: Cervical, supraclavicular, and axillary nodes normal. No abnormal inguinal nodes palpated Neurologic: Grossly normal  Pelvic: External genitalia:  no lesions              Urethra:  normal appearing urethra with no masses, tenderness or lesions              Bartholins and Skenes: normal                 Vagina: normal appearing vagina with normal color and discharge, no lesions              Cervix: no lesions                Bimanual Exam:  Uterus:  normal size, contour, position, consistency, mobility, non-tender              Adnexa: no mass, fullness, tenderness              Rectal exam: {yes no:[id number]}.  Confirms.              Anus:  normal sphincter tone, no lesions  Chaperone was present for exam:  ***  ASSESSMENT     PLAN     An After Visit Summary was printed and given to the patient.  ______ minutes face to face time of which over 50% was spent in counseling.

## 2022-10-13 ENCOUNTER — Other Ambulatory Visit: Payer: PRIVATE HEALTH INSURANCE

## 2022-10-13 ENCOUNTER — Other Ambulatory Visit: Payer: PRIVATE HEALTH INSURANCE | Admitting: Obstetrics and Gynecology

## 2022-11-01 ENCOUNTER — Encounter: Payer: Self-pay | Admitting: Obstetrics and Gynecology

## 2022-11-01 ENCOUNTER — Telehealth: Payer: Self-pay | Admitting: Obstetrics and Gynecology

## 2022-11-10 NOTE — Telephone Encounter (Signed)
Patient no show to her January 18 ultrasound appointment. Left message on January 22 and 30 also mailed letter on February 6 for patient to call and reschedule her ultrasound appointment. Patient has not returned our call.

## 2022-11-14 NOTE — Telephone Encounter (Signed)
PUS for family Hx of ovarian cancer.   Per review of EPIC, patient scheduled for 01/03/23.   Routing to provider for final review.  Will close encounter.

## 2022-11-16 ENCOUNTER — Other Ambulatory Visit: Payer: Self-pay

## 2022-11-16 ENCOUNTER — Other Ambulatory Visit: Payer: Self-pay | Admitting: Genetic Counselor

## 2022-11-16 ENCOUNTER — Inpatient Hospital Stay: Payer: PRIVATE HEALTH INSURANCE

## 2022-11-16 ENCOUNTER — Inpatient Hospital Stay: Payer: PRIVATE HEALTH INSURANCE | Attending: Genetic Counselor | Admitting: Genetic Counselor

## 2022-11-16 DIAGNOSIS — Z8041 Family history of malignant neoplasm of ovary: Secondary | ICD-10-CM | POA: Diagnosis not present

## 2022-11-16 DIAGNOSIS — Z803 Family history of malignant neoplasm of breast: Secondary | ICD-10-CM

## 2022-11-16 DIAGNOSIS — Z8051 Family history of malignant neoplasm of kidney: Secondary | ICD-10-CM

## 2022-11-16 DIAGNOSIS — Z8042 Family history of malignant neoplasm of prostate: Secondary | ICD-10-CM | POA: Diagnosis not present

## 2022-11-16 DIAGNOSIS — Z800 Family history of malignant neoplasm of digestive organs: Secondary | ICD-10-CM

## 2022-11-16 LAB — GENETIC SCREENING ORDER

## 2022-11-17 ENCOUNTER — Encounter: Payer: Self-pay | Admitting: Genetic Counselor

## 2022-11-17 DIAGNOSIS — Z803 Family history of malignant neoplasm of breast: Secondary | ICD-10-CM | POA: Insufficient documentation

## 2022-11-17 DIAGNOSIS — Z8042 Family history of malignant neoplasm of prostate: Secondary | ICD-10-CM | POA: Insufficient documentation

## 2022-11-17 DIAGNOSIS — Z8041 Family history of malignant neoplasm of ovary: Secondary | ICD-10-CM | POA: Insufficient documentation

## 2022-11-17 DIAGNOSIS — Z800 Family history of malignant neoplasm of digestive organs: Secondary | ICD-10-CM | POA: Insufficient documentation

## 2022-11-17 DIAGNOSIS — Z8051 Family history of malignant neoplasm of kidney: Secondary | ICD-10-CM | POA: Insufficient documentation

## 2022-11-17 NOTE — Progress Notes (Signed)
REFERRING PROVIDER: Nunzio Cobbs, MD [address] [place],  [place] [address]  PRIMARY PROVIDER:  Plotnikov, Evie Lacks, MD  PRIMARY REASON FOR VISIT:  1. Family history of breast cancer   2. Family history of ovarian cancer   3. Family history of prostate cancer   4. Family history of colon cancer   5. Family history of kidney cancer      HISTORY OF PRESENT ILLNESS:   Ms. Stanislaw, a 57 y.o. female, was seen for a  cancer genetics consultation at the request of Dr. Yisroel Ramming due to a family history of cancer.  Ms. Leseberg presents to clinic today to discuss the possibility of a hereditary predisposition to cancer, genetic testing, and to further clarify her future cancer risks, as well as potential cancer risks for family members.   Ms. Jenniges is a 57 y.o. female with no personal history of cancer.    CANCER HISTORY:  Oncology History   No history exists.     RISK FACTORS:  Menarche was at age 46.  First live birth at age 50.  OCP use for approximately 0 years.  Ovaries intact: yes.  Hysterectomy: no.  Menopausal status: postmenopausal.  HRT use: 0 years. Colonoscopy: yes;  4 polyps . Mammogram within the last year: yes. Number of breast biopsies: 0. Up to date with pelvic exams: yes. Any excessive radiation exposure in the past: no  Past Medical History:  Diagnosis Date   Acute pancreatitis 2011   Allergy    Anemia    CAD (coronary artery disease) 2009   Dr Olegario Shearer -[facility], Heart Cath   Family history of adverse reaction to anesthesia    "mother and brother have difficulty waking up out from it"   Family history of breast cancer    Family history of colon cancer    Family history of kidney cancer    Family history of ovarian cancer    Family history of prostate cancer    GERD (gastroesophageal reflux disease)    Hyperlipidemia    Hypertension    Hypothyroidism    "recently dx'd" (12/07/2015)   PVC  (premature ventricular contraction)    Statin intolerance    STD (sexually transmitted disease)    CHL 20 yrs ago   Thyroid nodule 2010   "precancerous; thyroid treated with radiation"   Tubular adenoma of colon 06/2015    Past Surgical History:  Procedure Laterality Date   CARDIAC CATHETERIZATION     unable to do cardiac cath but does stress echo 11/22/11   CARDIAC CATHETERIZATION N/A 12/04/2015   Procedure: Left Heart Cath and Coronary Angiography;  Surgeon: Adrian Prows, MD;  Location: [facility];  Service: Cardiovascular;  Laterality: N/A;   CARDIAC CATHETERIZATION N/A 12/04/2015   Procedure: Intravascular Pressure Wire/FFR Study;  Surgeon: Adrian Prows, MD;  Location: [facility];  Service: Cardiovascular;  Laterality: N/A;   CARDIAC CATHETERIZATION N/A 12/07/2015   Procedure: Coronary Stent Intervention;  Surgeon: Adrian Prows, MD;  Location: [facility];  Service: Cardiovascular;  Laterality: N/A;   COLONOSCOPY     CORONARY STENT PLACEMENT     EYE MUSCLE SURGERY Left 1969   LAPAROSCOPIC CHOLECYSTECTOMY  2008   LEFT HEART CATH AND CORONARY ANGIOGRAPHY N/A 11/24/2017   Procedure: LEFT HEART CATH AND CORONARY ANGIOGRAPHY;  Surgeon: Nigel Mormon, MD;  Location: [facility];  Service: Cardiovascular;  Laterality: N/A;   LEFT HEART CATH  AND CORONARY ANGIOGRAPHY N/A 06/17/2022   Procedure: LEFT HEART CATH AND CORONARY ANGIOGRAPHY;  Surgeon: Nigel Mormon, MD;  Location: [facility];  Service: Cardiovascular;  Laterality: N/A;   POLYPECTOMY     TUBAL LIGATION Bilateral 1995   UPPER GASTROINTESTINAL ENDOSCOPY     VENTRICULAR ABLATION SURGERY  2006,2010   times 2, follow-up for v-tack    Social History   Socioeconomic History   Marital status: Married    Spouse name: Not on file   Number of children: 4   Years of education: Not on file   Highest education level: Not on file  Occupational History   Occupation: Geologist, engineering at [employer]  Tobacco Use   Smoking status: Never   Smokeless tobacco: Never  Vaping Use   Vaping Use: Never used  Substance and Sexual Activity   Alcohol use: No   Drug use: No   Sexual activity: Not Currently    Partners: Male    Birth control/protection: Surgical, Abstinence    Comment: BTL  Other Topics Concern   Not on file  Social History Narrative   FAMILY HISTORY   History of hypertension   History of prostate cancer 1st degree relative <50   D, S ovar. [place]      Regular exercise - YES      GYN Dr Posey Pronto - [place]   Social Determinants of Health   Financial Resource Strain: Not on file  Food Insecurity: No Food Insecurity (06/18/2022)   Hunger Vital Sign    Worried About Running Out of Food in the Last Year: Never true    Ran Out of Food in the Last Year: Never true  Transportation Needs: No Transportation Needs (06/18/2022)   PRAPARE - Hydrologist (Medical): No    Lack of Transportation (Non-Medical): No  Physical Activity: Not on file  Stress: Not on file  Social Connections: Not on file     FAMILY HISTORY:  We obtained a detailed, 4-generation family history.  Significant diagnoses are listed below: Family History  Problem Relation Age of Onset   Hypertension Mother    Diabetes Mother    Colon polyps Mother    Hypertension Father    Prostate cancer Father    Heart attack Father    Colon cancer Father 57   Ovarian cancer Sister 3   Colon polyps Sister    Diabetes Brother    Breast cancer Paternal Aunt        dx. 54s   Diabetes Maternal Grandmother    Stomach cancer Maternal Grandmother    Crohn's disease Daughter    Heart attack Cousin    Kidney cancer Cousin 37   Esophageal cancer Neg Hx    Rectal cancer Neg Hx      The patient has three daughters and a son who are cancer free. She has three sisters and two brothers.  One sister had ovarian cancer at 71.  Her father is deceased and her mother is  living.  The patient's mother has had colon polyps.  She has two sisters and three brothers who are cancer free.  The maternal grandmother had stomach cancer.  The patient's father had colon cancer and prostate cancer.  He had two brothers and two sisters.  One sister had breast cancer in her 17's and this sister had a son who died of kidney cancer at 46.  There is no other reported  cancer history.  Ms. Costantino is unaware of previous family history of genetic testing for hereditary cancer risks. Patient's maternal ancestors are of African American descent, and paternal ancestors are of African American descent. There is no reported Ashkenazi Jewish ancestry. There is no known consanguinity.  GENETIC COUNSELING ASSESSMENT: Ms. Redhead is a 57 y.o. female with a family history of cancer which is somewhat suggestive of a hereditary cancer syndrome and predisposition to cancer given the combination of cancer and the young ages of onset. We, therefore, discussed and recommended the following at today's visit.   DISCUSSION: We discussed that, in general, most cancer is not inherited in families, but instead is sporadic or familial. Sporadic cancers occur by chance and typically happen at older ages (>50 years) as this type of cancer is caused by genetic changes acquired during an individual's lifetime. Some families have more cancers than would be expected by chance; however, the ages or types of cancer are not consistent with a known genetic mutation or known genetic mutations have been ruled out. This type of familial cancer is thought to be due to a combination of multiple genetic, environmental, hormonal, and lifestyle factors. While this combination of factors likely increases the risk of cancer, the exact source of this risk is not currently identifiable or testable.  We discussed that up to 20% of ovarian cancer is hereditary, with most cases associated with BRCA mutations.  There are other genes that  can be associated with hereditary ovarian cancer syndromes.  These include BRIP1, RAD51C, RAD51D and the Lynch syndrome genes.  We discussed that testing is beneficial for several reasons including knowing how to follow individuals after completing their treatment, identifying whether potential treatment options such as PARP inhibitors would be beneficial, and understand if other family members could be at risk for cancer and allow them to undergo genetic testing.   We reviewed the characteristics, features and inheritance patterns of hereditary cancer syndromes. We also discussed genetic testing, including the appropriate family members to test, the process of testing, insurance coverage and turn-around-time for results. We discussed the implications of a negative, positive, carrier and/or variant of uncertain significant result. Ms. Kious  was offered a common hereditary cancer panel (47 genes) and an expanded pan-cancer panel (77 genes). Ms. Sonday was informed of the benefits and limitations of each panel, including that expanded pan-cancer panels contain genes that do not have clear management guidelines at this point in time.  We also discussed that as the number of genes included on a panel increases, the chances of variants of uncertain significance increases. Ms. Latvala decided to pursue genetic testing for the CancerNext-Expanded+RNAinsight gene panel.   The CancerNext-Expanded gene panel offered by [facility] and includes sequencing and rearrangement analysis for the following 77 genes: AIP, ALK, APC*, ATM*, AXIN2, BAP1, BARD1, BLM, BMPR1A, BRCA1*, BRCA2*, BRIP1*, CDC73, CDH1*, CDK4, CDKN1B, CDKN2A, CHEK2*, CTNNA1, DICER1, FANCC, FH, FLCN, GALNT12, KIF1B, LZTR1, MAX, MEN1, MET, MLH1*, MSH2*, MSH3, MSH6*, MUTYH*, NBN, NF1*, NF2, NTHL1, PALB2*, PHOX2B, PMS2*, POT1, PRKAR1A, PTCH1, PTEN*, RAD51C*, RAD51D*, RB1, RECQL, RET, SDHA, SDHAF2, SDHB, SDHC, SDHD, SMAD4, SMARCA4, SMARCB1, SMARCE1, STK11,  SUFU, TMEM127, TP53*, TSC1, TSC2, VHL and XRCC2 (sequencing and deletion/duplication); EGFR, EGLN1, HOXB13, KIT, MITF, PDGFRA, POLD1, and POLE (sequencing only); EPCAM and GREM1 (deletion/duplication only). DNA and RNA analyses performed for * genes.   Based on Ms. Leamer's family history of cancer, she meets medical criteria for genetic testing. Despite that she meets criteria, she may still have an  out of pocket cost. We discussed that if her out of pocket cost for testing is over $100, the laboratory will call and confirm whether she wants to proceed with testing.  If the out of pocket cost of testing is less than $100 she will be billed by the genetic testing laboratory.   We discussed that some people do not want to undergo genetic testing due to fear of genetic discrimination.  The Genetic Information Nondiscrimination Act (GINA) was signed into federal law in 2008. GINA prohibits health insurers and most employers from discriminating against individuals based on genetic information (including the results of genetic tests and family history information). According to GINA, health insurance companies cannot consider genetic information to be a preexisting condition, nor can they use it to make decisions regarding coverage or rates. GINA also makes it illegal for most employers to use genetic information in making decisions about hiring, firing, promotion, or terms of employment. It is important to note that GINA does not offer protections for life insurance, disability insurance, or long-term care insurance. GINA does not apply to those in the [employer], those who work for companies with less than 15 employees, and new life insurance or long-term disability insurance policies.  Health status due to a cancer diagnosis is not protected under GINA. More information about GINA can be found by visiting [email or web address].   PLAN: After considering the risks, benefits, and limitations, Ms. Malmquist provided  informed consent to pursue genetic testing and the blood sample was sent to [facility] for analysis of the CancerNext-Expanded+RNAinsight. Results should be available within approximately 2-3 weeks' time, at which point they will be disclosed by telephone to Ms. Swartout, as will any additional recommendations warranted by these results. Ms. Steinert will receive a summary of her genetic counseling visit and a copy of her results once available. This information will also be available in Epic.   Based on Ms. Jocelyn's family history, we recommended her sister, who was diagnosed with ovarian cancer at age 30, have genetic counseling and testing. Ms. Lipsett will let us know if we can be of any assistance in coordinating genetic counseling and/or testing for this family member.   Lastly, we encouraged Ms. Denk to remain in contact with cancer genetics annually so that we can continuously update the family history and inform her of any changes in cancer genetics and testing that may be of benefit for this family.   Ms. Ausley questions were answered to her satisfaction today. Our contact information was provided should additional questions or concerns arise. Thank you for the referral and allowing [place] to share in the care of your patient.   Flo Berroa P. Florene Glen, [place], [facility] Licensed, Insurance risk surveyor [email or web address] phone: [phone]  The patient was seen for a total of 35 minutes in face-to-face genetic counseling.  The patient was seen alone.  Drs. Michell Heinrich, and/or [place] were available for questions, if needed..    _______________________________________________________________________ For Office Staff:  Number of people involved in session: 1 Was an Intern/ student involved with case: no

## 2022-11-25 ENCOUNTER — Institutional Professional Consult (permissible substitution): Payer: PRIVATE HEALTH INSURANCE | Admitting: Plastic Surgery

## 2022-11-28 ENCOUNTER — Encounter: Payer: Self-pay | Admitting: Genetic Counselor

## 2022-11-28 ENCOUNTER — Telehealth: Payer: Self-pay | Admitting: Genetic Counselor

## 2022-11-28 DIAGNOSIS — Z1379 Encounter for other screening for genetic and chromosomal anomalies: Secondary | ICD-10-CM | POA: Insufficient documentation

## 2022-11-28 NOTE — Telephone Encounter (Signed)
Revealed negative genetic testing.  Discussed that we do not know why there is cancer in the family. It could be due to a different gene that we are not testing, or maybe our current technology may not be able to pick something up.  It will be important for her to keep in contact with genetics to keep up with whether additional testing may be needed.   

## 2022-11-30 ENCOUNTER — Encounter: Payer: Self-pay | Admitting: Internal Medicine

## 2022-11-30 ENCOUNTER — Ambulatory Visit: Payer: PRIVATE HEALTH INSURANCE | Admitting: Internal Medicine

## 2022-11-30 VITALS — BP 136/80 | HR 72 | Temp 98.3°F | Ht 62.0 in | Wt 162.1 lb

## 2022-11-30 DIAGNOSIS — M545 Low back pain, unspecified: Secondary | ICD-10-CM

## 2022-11-30 MED ORDER — CYCLOBENZAPRINE HCL 5 MG PO TABS: 5.0000 mg | ORAL_TABLET | Freq: Three times a day (TID) | ORAL | 1 refills | Status: DC | PRN

## 2022-11-30 MED ORDER — METHYLPREDNISOLONE ACETATE 40 MG/ML IJ SUSP
40.0000 mg | Freq: Once | INTRAMUSCULAR | Status: AC
  Administered 2022-11-30: 40 mg via INTRAMUSCULAR

## 2022-11-30 MED ORDER — KETOROLAC TROMETHAMINE 30 MG/ML IJ SOLN
30.0000 mg | Freq: Once | INTRAMUSCULAR | Status: AC
  Administered 2022-11-30: 30 mg via INTRAMUSCULAR

## 2022-11-30 NOTE — Patient Instructions (Signed)
We have sent in the muscle relaxer flexeril to use for the pain. I recommend to take the naproxen twice a day for next 1-2 weeks.

## 2022-11-30 NOTE — Progress Notes (Signed)
   Subjective:   Patient ID: [name], female    DOB: April 20, 1966, 57 y.o.   MRN: [id number]  HPI The patient is a 57 YO female coming in for 1 day back pain. Took a muscle relaxer left over which did not help. Has not taken anything else. Overall same today and moderate to severe pain.  Review of Systems  Constitutional:  Positive for activity change. Negative for appetite change, chills, fatigue, fever and unexpected weight change.  Respiratory: Negative.    Cardiovascular: Negative.   Gastrointestinal: Negative.   Musculoskeletal:  Positive for arthralgias, back pain and myalgias. Negative for gait problem and joint swelling.  Skin: Negative.   Neurological: Negative.     Objective:  Physical Exam Constitutional:      Appearance: She is well-developed.  HENT:     Head: Normocephalic and atraumatic.  Cardiovascular:     Rate and Rhythm: Normal rate and regular rhythm.  Pulmonary:     Effort: Pulmonary effort is normal. No respiratory distress.     Breath sounds: Normal breath sounds. No wheezing or rales.  Abdominal:     General: Bowel sounds are normal. There is no distension.     Palpations: Abdomen is soft.     Tenderness: There is no abdominal tenderness. There is no rebound.  Musculoskeletal:        General: Tenderness present.     Cervical back: Normal range of motion.  Skin:    General: Skin is warm and dry.  Neurological:     Mental Status: She is alert and oriented to person, place, and time.     Coordination: Coordination normal.     Vitals:   11/30/22 1018  BP: 136/80  Pulse: 72  Temp: 98.3 F (36.8 C)  TempSrc: Oral  SpO2: 99%  Weight: 162 lb 2 oz (73.5 kg)  Height: '5\' 2"'$  (1.575 m)    Assessment & Plan:  Toradol 30 mg IM and depo-medrol 40 mg IM given at visit

## 2022-12-01 ENCOUNTER — Encounter: Payer: Self-pay | Admitting: Internal Medicine

## 2022-12-01 NOTE — Assessment & Plan Note (Signed)
Acute back pain 1 day without clear trigger. Suspect back spasm/sprain. Toradol 30 mg IM given and depo-medrol 40 mg IM given at visit. Asked her to use naproxen she has leftover at home. Rx flexeril in case needed for night time for pain. If no improvement in 1-2 weeks return or call back. No imaging indicated today.

## 2022-12-02 ENCOUNTER — Ambulatory Visit: Payer: Self-pay | Admitting: Genetic Counselor

## 2022-12-02 DIAGNOSIS — Z1379 Encounter for other screening for genetic and chromosomal anomalies: Secondary | ICD-10-CM

## 2022-12-02 NOTE — Progress Notes (Signed)
HPI:  [name] was previously seen in the [place] clinic due to a family history of cancer and concerns regarding a hereditary predisposition to cancer. Please refer to our prior cancer genetics clinic note for more information regarding our discussion, assessment and recommendations, at the time. [name] recent genetic test results were disclosed to her, as were recommendations warranted by these results. These results and recommendations are discussed in more detail below.  CANCER HISTORY:  Oncology History   No history exists.    FAMILY HISTORY:  We obtained a detailed, 4-generation family history.  Significant diagnoses are listed below: Family History  Problem Relation Age of Onset   Hypertension Mother    Diabetes Mother    Colon polyps Mother    Hypertension Father    Prostate cancer Father    Heart attack Father    Colon cancer Father 34   Ovarian cancer Sister 21   Colon polyps Sister    Diabetes Brother    Breast cancer Paternal Aunt        dx. 18s   Diabetes Maternal Grandmother    Stomach cancer Maternal Grandmother    Crohn's disease Daughter    Heart attack Cousin    Kidney cancer Cousin 37   Esophageal cancer Neg Hx    Rectal cancer Neg Hx        The patient has three daughters and a son who are cancer free. She has three sisters and two brothers.  One sister had ovarian cancer at 26.  Her father is deceased and her mother is living.   The patient's mother has had colon polyps.  She has two sisters and three brothers who are cancer free.  The maternal grandmother had stomach cancer.   The patient's father had colon cancer and prostate cancer.  He had two brothers and two sisters.  One sister had breast cancer in her 76's and this sister had a son who died of kidney cancer at 89.  There is no other reported cancer history.   [name] is unaware of previous family history of genetic testing for hereditary cancer risks. Patient's  maternal ancestors are of African American descent, and paternal ancestors are of African American descent. There is no reported Ashkenazi Jewish ancestry. There is no known consanguinity  GENETIC TEST RESULTS: Genetic testing reported out on November 25, 2022 through the CancerNext-Expanded+RNAinsight cancer panel found no pathogenic mutations. The CancerNext-Expanded gene panel offered by [facility] and includes sequencing and rearrangement analysis for the following 77 genes: AIP, ALK, APC*, ATM*, AXIN2, BAP1, BARD1, BMPR1A, BRCA1*, BRCA2*, BRIP1*, CDC73, CDH1*, CDK4, CDKN1B, CDKN2A, CHEK2*, CTNNA1, DICER1, FH, FLCN, KIF1B, LZTR1, MAX, MEN1, MET, MLH1*, MSH2*, MSH3, MSH6*, MUTYH*, NF1*, NF2, NTHL1, PALB2*, PHOX2B, PMS2*, POT1, PRKAR1A, PTCH1, PTEN*, RAD51C*, RAD51D*, RB1, RET, SDHA, SDHAF2, SDHB, SDHC, SDHD, SMAD4, SMARCA4, SMARCB1, SMARCE1, STK11, SUFU, TMEM127, TP53*, TSC1, TSC2, and VHL (sequencing and deletion/duplication); EGFR, EGLN1, HOXB13, KIT, MITF, PDGFRA, POLD1, and POLE (sequencing only); EPCAM and GREM1 (deletion/duplication only). DNA and RNA analyses performed for * genes. . The test report has been scanned into EPIC and is located under the Molecular Pathology section of the Results Review tab.  A portion of the result report is included below for reference.     We discussed with [name] that because current genetic testing is not perfect, it is possible there may be a gene mutation in one of these genes that current testing cannot detect, but that chance is  small.  We also discussed, that there could be another gene that has not yet been discovered, or that we have not yet tested, that is responsible for the cancer diagnoses in the family. It is also possible there is a hereditary cause for the cancer in the family that [name] did not inherit and therefore was not identified in her testing.  Therefore, it is important to remain in touch with cancer genetics in the future so that  we can continue to offer [name] the most up to date genetic testing.   ADDITIONAL GENETIC TESTING: We discussed with [name] that her genetic testing was fairly extensive.  If there are genes identified to increase cancer risk that can be analyzed in the future, we would be happy to discuss and coordinate this testing at that time.    CANCER SCREENING RECOMMENDATIONS: [name] test result is considered negative (normal).  This means that we have not identified a hereditary cause for her family history of cancer at this time. Most cancers happen by chance and this negative test suggests that her cancer may fall into this category.    While reassuring, this does not definitively rule out a hereditary predisposition to cancer. It is still possible that there could be genetic mutations that are undetectable by current technology. There could be genetic mutations in genes that have not been tested or identified to increase cancer risk.  Therefore, it is recommended she continue to follow the cancer management and screening guidelines provided by her primary healthcare provider.   An individual's cancer risk and medical management are not determined by genetic test results alone. Overall cancer risk assessment incorporates additional factors, including personal medical history, family history, and any available genetic information that may result in a personalized plan for cancer prevention and surveillance   RECOMMENDATIONS FOR FAMILY MEMBERS:  Individuals in this family might be at some increased risk of developing cancer, over the general population risk, simply due to the family history of cancer.  We recommended women in this family have a yearly mammogram beginning at age 4, or 55 years younger than the earliest onset of cancer, an annual clinical breast exam, and perform monthly breast self-exams. Women in this family should also have a gynecological exam as recommended by their primary  provider. All family members should be referred for colonoscopy starting at age 86.  FOLLOW-UP: Lastly, we discussed with [name] that cancer genetics is a rapidly advancing field and it is possible that new genetic tests will be appropriate for her and/or her family members in the future. We encouraged her to remain in contact with cancer genetics on an annual basis so we can update her personal and family histories and let her know of advances in cancer genetics that may benefit this family.   Our contact number was provided. [name] questions were answered to her satisfaction, and she knows she is welcome to call us at anytime with additional questions or concerns.   Roma Kayser, Trainer, [facility] Licensed, Certified Genetic Counselor [email or web address]

## 2022-12-20 NOTE — Progress Notes (Unsigned)
GYNECOLOGY  VISIT   HPI: 57 y.o.   Married  [place]   [phone] with Patient's last menstrual period was 11/17/2017.   here for  [place] consult.  FH ovarian cancer in her sister. Patient had negative genetic testing.   Personal hx fibroids.   Occasional lower abdominal pain.   No bleeding.   GYNECOLOGIC HISTORY: Patient's last menstrual period was 11/17/2017. Contraception:  PMP Menopausal hormone therapy:  n/a Last mammogram:  2021 per pt.  [place] [facility] [place].  Last pap smear:   09/01/22 - normal and neg HR HPV, 2018 per pt, 2008 normal         OB History     Gravida  4   Para  4   Term  4   Preterm      AB      Living  4      SAB      IAB      Ectopic      Multiple      Live Births  4              Patient Active Problem List   Diagnosis Date Noted   Genetic testing 11/28/2022   Family history of breast cancer 11/17/2022   Family history of ovarian cancer 11/17/2022   Family history of prostate cancer 11/17/2022   Family history of colon cancer 11/17/2022   Family history of kidney cancer 11/17/2022   Cervical pain (neck) 09/01/2022   Large breasts 09/01/2022   Sinus bradycardia 06/17/2022   Thoracic back pain 06/16/2022   Nausea 10/06/2021   Rectal bleeding 10/06/2021   Statin intolerance 10/04/2021   Unstable angina 07/11/2020   Synovitis of left foot 02/05/2020   Arthritis of first metatarsophalangeal (MTP) joint of left foot 01/27/2020   Sprain of right ankle 01/27/2020   Headache 12/05/2019   Arthralgia 03/19/2019   Right-sided chest wall pain 01/11/2019   Upper respiratory infection 06/08/2018   PVC (premature ventricular contraction) 02/13/2018   Hypothyroidism 12/13/2017   Palpitations 12/28/2015   S/P primary angioplasty with coronary stent 12/07/2015   CAD (coronary artery disease), native coronary artery 12/06/2015   Non-cardiac chest pain 12/04/2015   Abdominal pain, epigastric 12/04/2015    Bladder pain 05/14/2015   Incomplete bladder emptying 05/14/2015   Occipital neuralgia of left side 11/14/2013   Aspiration into respiratory tract 04/03/2012   Cough due to bronchospasm 04/03/2012   Chest pain, atypical 04/03/2012   HYPERGLYCEMIA 06/16/2010   BACK PAIN, LUMBAR 05/27/2009   THYROID NODULE, RIGHT 05/04/2009   Hyperlipidemia with target LDL less than 70 12/31/2008   H/O acute pancreatitis 12/31/2008   LEG PAIN, BILATERAL 12/31/2008   PARESTHESIA 12/31/2008   EDEMA 12/31/2008   History of disease 12/31/2008   RLQ abdominal pain 10/02/2007   GERD 09/17/2007   DYSPNEA 08/28/2007   Dysphagia 08/28/2007   Essential hypertension 04/24/2007    Past Medical History:  Diagnosis Date   Acute pancreatitis 2011   Allergy    Anemia    CAD (coronary artery disease) 2009   Dr Linton Ham[facility] Heart Cath   Family history of adverse reaction to anesthesia    "mother and brother have difficulty waking up out from it"   Family history of breast cancer    Family history of colon cancer    Family history of kidney cancer    Family history of ovarian cancer    Family history of prostate cancer  GERD (gastroesophageal reflux disease)    Hyperlipidemia    Hypertension    Hypothyroidism    "recently dx'd" (12/07/2015)   PVC (premature ventricular contraction)    Statin intolerance    STD (sexually transmitted disease)    CHL 20 yrs ago   Thyroid nodule 2010   "precancerous; thyroid treated with radiation"   Tubular adenoma of colon 06/2015    Past Surgical History:  Procedure Laterality Date   CARDIAC CATHETERIZATION     unable to do cardiac cath but does stress echo 11/22/11   CARDIAC CATHETERIZATION N/A 12/04/2015   Procedure: Left Heart Cath and Coronary Angiography;  Surgeon: Yates DecampJay Ganji, MD;  [facility];  Service: Cardiovascular;  Laterality: N/A;   CARDIAC CATHETERIZATION N/A 12/04/2015   Procedure: Intravascular Pressure Wire/FFR Study;   Surgeon: Yates DecampJay Ganji, MD;  [facility];  Service: Cardiovascular;  Laterality: N/A;   CARDIAC CATHETERIZATION N/A 12/07/2015   Procedure: Coronary Stent Intervention;  Surgeon: Yates DecampJay Ganji, MD;  [facility];  Service: Cardiovascular;  Laterality: N/A;   COLONOSCOPY     CORONARY STENT PLACEMENT     EYE MUSCLE SURGERY Left 1969   LAPAROSCOPIC CHOLECYSTECTOMY  2008   LEFT HEART CATH AND CORONARY ANGIOGRAPHY N/A 11/24/2017   Procedure: LEFT HEART CATH AND CORONARY ANGIOGRAPHY;  Surgeon: Elder NegusPatwardhan, Manish J, MD;  Location: [facility] INVAS[facility] CV LAB;  Service: Cardiovascular;  Laterality: N/A;   LEFT HEART CATH AND CORONARY ANGIOGRAPHY N/A 06/17/2022   Procedure: LEFT HEART CATH AND CORONARY ANGIOGRAPHY;  Surgeon: Elder NegusPatwardhan, Manish J, MD;  Location: [facility] INVASIVE CV LAB;  Service: Cardiovascular;  Laterality: N/A;   POLYPECTOMY     TUBAL LIGATION Bilateral 1995   UPPER GASTROINTESTINAL ENDOSCOPY     VENTRICULAR ABLATION SURGERY  2006,2010   times 2, follow-up for v-tack    Current Outpatient Medications  Medication Sig Dispense Refill   acetaminophen (TYLENOL) 500 MG tablet Take 1,000 mg by mouth as needed (pain).     amLODipine (NORVASC) 10 MG tablet Take 10 mg by mouth daily.     aspirin EC 81 MG tablet Take 1 tablet (81 mg total) by mouth daily. Swallow whole. 150 tablet 2   Cholecalciferol (VITAMIN D) 2000 units tablet Take 2,000 Units by mouth daily.     cyclobenzaprine (FLEXERIL) 5 MG tablet Take 1 tablet (5 mg total) by mouth 3 (three) times daily as needed for muscle spasms. 30 tablet 1   naproxen (NAPROSYN) 500 MG tablet Take 1 tablet (500 mg total) by mouth 2 (two) times daily as needed for moderate pain (neck pain). 30 tablet 1   nitroGLYCERIN (NITROSTAT) 0.4 MG SL tablet Place 0.4 mg under the tongue every 5 (five) minutes as needed.     ranolazine (RANEXA) 500 MG 12 hr tablet Take by mouth.     Alirocumab 150 MG/ML SOAJ Inject into the skin. (Patient not taking:  Reported on 01/03/2023)     No current facility-administered medications for this visit.     ALLERGIES: Crestor [rosuvastatin calcium], Lipitor [atorvastatin], Lisinopril, Livalo [pitavastatin], and Repatha [evolocumab]  Family History  Problem Relation Age of Onset   Hypertension Mother    Diabetes Mother    Colon polyps Mother    Hypertension Father    Prostate cancer Father    Heart attack Father    Colon cancer Father [id number]   Ovarian cancer Sister [id number]   Colon polyps Sister    Diabetes Brother  Breast cancer Paternal Aunt        dx. 50s   Diabetes Maternal Grandmother    Stomach cancer Maternal Grandmother    Crohn's disease Daughter    Heart attack Cousin    Kidney cancer Cousin 37   Esophageal cancer Neg Hx    Rectal cancer Neg Hx     Social History   Socioeconomic History   Marital status: Married    Spouse name: Not on file   Number of children: 4   Years of education: Not on file   Highest education level: Not on file  Occupational History   Occupation: Dentist at [employer]  Tobacco Use   Smoking status: Never   Smokeless tobacco: Never  Vaping Use   Vaping Use: Never used  Substance and Sexual Activity   Alcohol use: No   Drug use: No   Sexual activity: Yes    Partners: Male    Birth control/protection: Surgical, Post-menopausal    Comment: BTL  Other Topics Concern   Not on file  Social History Narrative   FAMILY HISTORY   History of hypertension   History of prostate cancer 1st degree relative <50   D, S ovar. CA      Regular exercise - YES      GYN Dr Allena Katz - [place]   Social Determinants of Health   Financial Resource Strain: Not on file  Food Insecurity: No Food Insecurity (06/18/2022)   Hunger Vital Sign    Worried About Running Out of Food in the Last Year: Never true    Ran Out of Food in the Last Year: Never true  Transportation Needs: No Transportation Needs (06/18/2022)   PRAPARE -  Administrator, Civil Service (Medical): No    Lack of Transportation (Non-Medical): No  Physical Activity: Not on file  Stress: Not on file  Social Connections: Not on file  Intimate Partner Violence: Not At Risk (06/18/2022)   Humiliation, Afraid, Rape, and Kick questionnaire    Fear of Current or Ex-Partner: No    Emotionally Abused: No    Physically Abused: No    Sexually Abused: No    Review of Systems  All other systems reviewed and are negative.   PHYSICAL EXAMINATION:    BP 132/84 (BP Location: Left Arm, Patient Position: Sitting, Cuff Size: Normal)   Pulse 69   Wt 162 lb (73.5 kg)   LMP 11/17/2017   SpO2 98%   BMI 29.63 kg/m     General appearance: alert, cooperative and appears stated age   Pelvic US Uterus 7.46 x 5.73 x 3.74 cm.  4 fibroids:  2.68 cm, 2.50 cm, 0.70 cm, 0.9 cm. EMS 3.07 mm. Left ovary 2.45 x 1.61 x 1.69 cm. Right ovary 2.14 x 1.39 x 1.51 cm No adnexal mass.  No free fluid.  ASSESSMENT  Family history of ovarian cancer.   Patient has had negative genetic testing.  Normal ovaries on ultrasound today.  Fibroids.  PLAN  Pelvic ultrasound images and report reviewed.  No CA125 at this time.  Signs and symptoms of ovarian cancer reviewed.  Uterine fibroids discussed.  Patient will update her mammogram. FU for annual exam and prn.    20 min  total time was spent for this patient encounter, including preparation, face-to-face counseling with the patient, coordination of care, and documentation of the encounter.

## 2023-01-03 ENCOUNTER — Ambulatory Visit (INDEPENDENT_AMBULATORY_CARE_PROVIDER_SITE_OTHER): Payer: PRIVATE HEALTH INSURANCE

## 2023-01-03 ENCOUNTER — Ambulatory Visit (INDEPENDENT_AMBULATORY_CARE_PROVIDER_SITE_OTHER): Payer: PRIVATE HEALTH INSURANCE | Admitting: Obstetrics and Gynecology

## 2023-01-03 ENCOUNTER — Encounter: Payer: Self-pay | Admitting: Obstetrics and Gynecology

## 2023-01-03 VITALS — BP 132/84 | HR 69 | Wt 162.0 lb

## 2023-01-03 DIAGNOSIS — Z8041 Family history of malignant neoplasm of ovary: Secondary | ICD-10-CM

## 2023-01-03 DIAGNOSIS — D259 Leiomyoma of uterus, unspecified: Secondary | ICD-10-CM | POA: Diagnosis not present

## 2023-02-10 ENCOUNTER — Ambulatory Visit: Payer: PRIVATE HEALTH INSURANCE | Admitting: Plastic Surgery

## 2023-02-10 VITALS — BP 125/77 | HR 67 | Ht 62.0 in | Wt 165.4 lb

## 2023-02-10 DIAGNOSIS — M546 Pain in thoracic spine: Secondary | ICD-10-CM | POA: Diagnosis not present

## 2023-02-10 DIAGNOSIS — N62 Hypertrophy of breast: Secondary | ICD-10-CM | POA: Diagnosis not present

## 2023-02-10 DIAGNOSIS — G8929 Other chronic pain: Secondary | ICD-10-CM

## 2023-02-10 DIAGNOSIS — M545 Low back pain, unspecified: Secondary | ICD-10-CM | POA: Diagnosis not present

## 2023-02-10 DIAGNOSIS — R21 Rash and other nonspecific skin eruption: Secondary | ICD-10-CM

## 2023-02-10 DIAGNOSIS — I2510 Atherosclerotic heart disease of native coronary artery without angina pectoris: Secondary | ICD-10-CM

## 2023-02-10 DIAGNOSIS — M542 Cervicalgia: Secondary | ICD-10-CM

## 2023-02-10 DIAGNOSIS — E890 Postprocedural hypothyroidism: Secondary | ICD-10-CM

## 2023-02-10 NOTE — Progress Notes (Signed)
Patient ID: [name], female    DOB: 22-Sep-1966, 57 y.o.   MRN: [id number]   Chief Complaint  Patient presents with   Advice Only    Mammary Hyperplasia: The patient is a 57 y.o. female with a history of mammary hyperplasia for several years.  She has extremely large breasts causing symptoms that include the following: Back pain in the upper and lower back, including neck pain. She pulls or pins her bra straps to provide better lift and relief of the pressure and pain. She notices relief by holding her breast up manually.  Her shoulder straps cause grooves and pain and pressure that requires padding for relief. Pain medication is sometimes required with motrin and tylenol.  Activities that are hindered by enlarged breasts include: exercise and running.  She has tried supportive clothing as well as fitted bras without improvement.  Her breasts are extremely large and fairly symmetric.  She has hyperpigmentation of the inframammary area on both sides.  The sternal to nipple distance on the right is 30 cm and the left is 30 cm.  The IMF distance is 12 cm.  She is 5 feet 2 inches tall and weighs 162 pounds.  The BMI = 29.6 kg/m.  Preoperative bra size = D cup.  She would like to be a B or C cup the estimated excess breast tissue to be removed at the time of surgery = 400-450 grams on the left and 400-450 grams on the right.  Mammogram history: She is planning on having 1 next month and will be sure to send [place] her report.  Family history of breast cancer: None.  Tobacco use: None.   The patient expresses the desire to pursue surgical intervention.    Review of Systems  Constitutional: Negative.   HENT: Negative.    Eyes: Negative.   Respiratory: Negative.  Negative for chest tightness and shortness of breath.   Cardiovascular: Negative.   Gastrointestinal: Negative.   Endocrine: Negative.   Genitourinary: Negative.   Musculoskeletal:  Positive for back pain and neck pain.  Skin:   Positive for rash.    Past Medical History:  Diagnosis Date   Acute pancreatitis 2011   Allergy    Anemia    CAD (coronary artery disease) 2009   Dr Linton Ham [facility], Heart Cath   Family history of adverse reaction to anesthesia    "mother and brother have difficulty waking up out from it"   Family history of breast cancer    Family history of colon cancer    Family history of kidney cancer    Family history of ovarian cancer    Family history of prostate cancer    GERD (gastroesophageal reflux disease)    Hyperlipidemia    Hypertension    Hypothyroidism    "recently dx'd" (12/07/2015)   PVC (premature ventricular contraction)    Statin intolerance    STD (sexually transmitted disease)    CHL 20 yrs ago   Thyroid nodule 2010   "precancerous; thyroid treated with radiation"   Tubular adenoma of colon 06/2015    Past Surgical History:  Procedure Laterality Date   CARDIAC CATHETERIZATION     unable to do cardiac cath but does stress echo 11/22/11   CARDIAC CATHETERIZATION N/A 12/04/2015   Procedure: Left Heart Cath and Coronary Angiography;  Surgeon: Yates Decamp, MD;  Location[facility] INVASIVE CV LAB;  Service: Cardiovascular;  Laterality: N/A;   CARDIAC CATHETERIZATION N/A 12/04/2015   Procedure:  Intravascular Pressure Wire/FFR Study;  Surgeon: Yates Decamp, MD;  Location: [facility] INVASIVE CV LAB;  Service: Cardiovascular;  Laterality: N/A;   CARDIAC CATHETERIZATION N/A 12/07/2015   Procedure: Coronary Stent Intervention;  Surgeon: Yates Decamp, MD;  Location: [facility] INVASIVE CV LAB;  Service: Cardiovascular;  Laterality: N/A;   COLONOSCOPY     CORONARY STENT PLACEMENT     EYE MUSCLE SURGERY Left 1969   LAPAROSCOPIC CHOLECYSTECTOMY  2008   LEFT HEART CATH AND CORONARY ANGIOGRAPHY N/A 11/24/2017   Procedure: LEFT HEART CATH AND CORONARY ANGIOGRAPHY;  Surgeon: Elder Negus, MD;  Location: [facility] INVAS[facility] CV LAB;  Service: Cardiovascular;  Laterality: N/A;   LEFT HEART CATH AND CORONARY  ANGIOGRAPHY N/A 06/17/2022   Procedure: LEFT HEART CATH AND CORONARY ANGIOGRAPHY;  Surgeon: Elder Negus, MD;  Location: [facility] INVASIVE CV LAB;  Service: Cardiovascular;  Laterality: N/A;   POLYPECTOMY     TUBAL LIGATION Bilateral 1995   UPPER GASTROINTESTINAL ENDOSCOPY     VENTRICULAR ABLATION SURGERY  2006,2010   times 2, follow-up for v-tack      Current Outpatient Medications:    acetaminophen (TYLENOL) 500 MG tablet, Take 1,000 mg by mouth as needed (pain)., Disp: , Rfl:    Alirocumab 150 MG/ML SOAJ, Inject into the skin., Disp: , Rfl:    amLODipine (NORVASC) 10 MG tablet, Take 10 mg by mouth daily., Disp: , Rfl:    aspirin EC 81 MG tablet, Take 1 tablet (81 mg total) by mouth daily. Swallow whole., Disp: 150 tablet, Rfl: 2   Cholecalciferol (VITAMIN D) 2000 units tablet, Take 2,000 Units by mouth daily., Disp: , Rfl:    cyclobenzaprine (FLEXERIL) 5 MG tablet, Take 1 tablet (5 mg total) by mouth 3 (three) times daily as needed for muscle spasms., Disp: 30 tablet, Rfl: 1   naproxen (NAPROSYN) 500 MG tablet, Take 1 tablet (500 mg total) by mouth 2 (two) times daily as needed for moderate pain (neck pain)., Disp: 30 tablet, Rfl: 1   nitroGLYCERIN (NITROSTAT) 0.4 MG SL tablet, Place 0.4 mg under the tongue every 5 (five) minutes as needed., Disp: , Rfl:    ranolazine (RANEXA) 500 MG 12 hr tablet, Take by mouth., Disp: , Rfl:    Objective:   Vitals:   02/10/23 0841  BP: 125/77  Pulse: 67  SpO2: 97%    Physical Exam Vitals and nursing note reviewed.  Constitutional:      Appearance: Normal appearance.  HENT:     Head: Normocephalic and atraumatic.  Cardiovascular:     Rate and Rhythm: Normal rate.     Pulses: Normal pulses.  Pulmonary:     Effort: Pulmonary effort is normal.  Abdominal:     Palpations: Abdomen is soft.  Musculoskeletal:        General: No swelling or deformity.  Skin:    General: Skin is warm.     Capillary Refill: Capillary refill takes less than 2  seconds.  Neurological:     Mental Status: She is alert and oriented to person, place, and time.  Psychiatric:        Mood and Affect: Mood normal.        Behavior: Behavior normal.        Thought Content: Thought content normal.        Judgment: Judgment normal.     Assessment & Plan:  Coronary artery disease involving native coronary artery of native heart without angina pectoris  Chronic bilateral thoracic back pain  Postablative  hypothyroidism  Symptomatic mammary hypertrophy  The procedure the patient selected and that was best for the patient was discussed. The risk were discussed and include but not limited to the following:  Breast asymmetry, fluid accumulation, firmness of the breast, inability to breast feed, loss of nipple or areola, skin loss, change in skin and nipple sensation, fat necrosis of the breast tissue, bleeding, infection and healing delay.  There are risks of anesthesia and injury to nerves or blood vessels.  Allergic reaction to tape, suture and skin glue are possible.  There will be swelling.  Any of these can lead to the need for revisional surgery which is not included in this surgery.  A breast reduction has potential to interfere with diagnostic procedures in the future.  This procedure is best done when the breast is fully developed.  Changes in the breast will continue to occur over time: pregnancy, weight gain or weigh loss. No guarantees are given for a certain bra or breast size.    Total time: 40 minutes. This includes time spent with the patient during the visit as well as time spent before and after the visit reviewing the chart, documenting the encounter, ordering pertinent studies and literature for the patient.    Mammogram is planned for next month.  We will go ahead and submit and ask for preauthorization for bilateral breast reduction with possible liposuction  Pictures were obtained of the patient and placed in the chart with the patient's or  guardian's permission.   Alena Bills Jesyca Weisenburger, DO

## 2023-03-03 LAB — HM MAMMOGRAPHY

## 2023-03-07 ENCOUNTER — Encounter: Payer: Self-pay | Admitting: Internal Medicine

## 2023-04-28 ENCOUNTER — Encounter: Payer: Self-pay | Admitting: Internal Medicine

## 2023-04-28 ENCOUNTER — Emergency Department (HOSPITAL_COMMUNITY): Payer: PRIVATE HEALTH INSURANCE

## 2023-04-28 ENCOUNTER — Encounter (HOSPITAL_COMMUNITY): Payer: Self-pay | Admitting: Emergency Medicine

## 2023-04-28 ENCOUNTER — Ambulatory Visit: Payer: PRIVATE HEALTH INSURANCE | Admitting: Internal Medicine

## 2023-04-28 ENCOUNTER — Other Ambulatory Visit: Payer: Self-pay

## 2023-04-28 ENCOUNTER — Emergency Department (HOSPITAL_COMMUNITY)
Admission: EM | Admit: 2023-04-28 | Discharge: 2023-04-29 | Disposition: A | Discharge status: Home / Self Care | Payer: PRIVATE HEALTH INSURANCE | Attending: Emergency Medicine | Admitting: Emergency Medicine

## 2023-04-28 VITALS — BP 130/74 | HR 72 | Temp 98.2°F | Ht 62.0 in | Wt 162.1 lb

## 2023-04-28 DIAGNOSIS — R7309 Other abnormal glucose: Secondary | ICD-10-CM | POA: Diagnosis not present

## 2023-04-28 DIAGNOSIS — Z79899 Other long term (current) drug therapy: Secondary | ICD-10-CM | POA: Diagnosis not present

## 2023-04-28 DIAGNOSIS — I10 Essential (primary) hypertension: Secondary | ICD-10-CM | POA: Insufficient documentation

## 2023-04-28 DIAGNOSIS — R0789 Other chest pain: Secondary | ICD-10-CM | POA: Diagnosis not present

## 2023-04-28 DIAGNOSIS — I2510 Atherosclerotic heart disease of native coronary artery without angina pectoris: Secondary | ICD-10-CM | POA: Insufficient documentation

## 2023-04-28 DIAGNOSIS — R202 Paresthesia of skin: Secondary | ICD-10-CM | POA: Diagnosis not present

## 2023-04-28 DIAGNOSIS — R531 Weakness: Secondary | ICD-10-CM | POA: Insufficient documentation

## 2023-04-28 DIAGNOSIS — R002 Palpitations: Secondary | ICD-10-CM | POA: Diagnosis not present

## 2023-04-28 LAB — CBC WITH DIFFERENTIAL/PLATELET
Abs Immature Granulocytes: 0.01 10*3/uL (ref 0.00–0.07)
Basophils Absolute: 0 10*3/uL (ref 0.0–0.1)
Basophils Relative: 1 %
Eosinophils Absolute: 0.1 10*3/uL (ref 0.0–0.5)
Eosinophils Relative: 2 %
HCT: 45.2 % (ref 36.0–46.0)
Hemoglobin: 14.6 g/dL (ref 12.0–15.0)
Immature Granulocytes: 0 %
Lymphocytes Relative: 42 %
Lymphs Abs: 2.4 10*3/uL (ref 0.7–4.0)
MCH: 29.9 pg (ref 26.0–34.0)
MCHC: 32.3 g/dL (ref 30.0–36.0)
MCV: 92.6 fL (ref 80.0–100.0)
Monocytes Absolute: 0.4 10*3/uL (ref 0.1–1.0)
Monocytes Relative: 7 %
Neutro Abs: 2.8 10*3/uL (ref 1.7–7.7)
Neutrophils Relative %: 48 %
Platelets: 300 10*3/uL (ref 150–400)
RBC: 4.88 MIL/uL (ref 3.87–5.11)
RDW: 13 % (ref 11.5–15.5)
WBC: 5.8 10*3/uL (ref 4.0–10.5)
nRBC: 0 % (ref 0.0–0.2)

## 2023-04-28 LAB — URINALYSIS, ROUTINE W REFLEX MICROSCOPIC
Bilirubin Urine: NEGATIVE
Glucose, UA: NEGATIVE mg/dL
Hgb urine dipstick: NEGATIVE
Ketones, ur: NEGATIVE mg/dL
Nitrite: NEGATIVE
Protein, ur: NEGATIVE mg/dL
Specific Gravity, Urine: 1.019 (ref 1.005–1.030)
pH: 5 (ref 5.0–8.0)

## 2023-04-28 LAB — COMPREHENSIVE METABOLIC PANEL
ALT: 113 U/L — ABNORMAL HIGH (ref 0–44)
AST: 47 U/L — ABNORMAL HIGH (ref 15–41)
Albumin: 3.9 g/dL (ref 3.5–5.0)
Alkaline Phosphatase: 83 U/L (ref 38–126)
Anion gap: 9 (ref 5–15)
BUN: 15 mg/dL (ref 6–20)
CO2: 27 mmol/L (ref 22–32)
Calcium: 9.5 mg/dL (ref 8.9–10.3)
Chloride: 102 mmol/L (ref 98–111)
Creatinine, Ser: 0.98 mg/dL (ref 0.44–1.00)
GFR, Estimated: >60 mL/min (ref >60)
Glucose, Bld: 97 mg/dL (ref 70–99)
Potassium: 4.2 mmol/L (ref 3.5–5.1)
Sodium: 138 mmol/L (ref 135–145)
Total Bilirubin: 0.9 mg/dL (ref 0.3–1.2)
Total Protein: 7.8 g/dL (ref 6.5–8.1)

## 2023-04-28 LAB — RAPID URINE DRUG SCREEN, HOSP PERFORMED
Amphetamines: NOT DETECTED
Barbiturates: NOT DETECTED
Benzodiazepines: NOT DETECTED
Cocaine: NOT DETECTED
Opiates: NOT DETECTED
Tetrahydrocannabinol: NOT DETECTED

## 2023-04-28 LAB — I-STAT CHEM 8, ED
BUN: 18 mg/dL (ref 6–20)
Calcium, Ion: 1.16 mmol/L (ref 1.15–1.40)
Chloride: 104 mmol/L (ref 98–111)
Creatinine, Ser: 0.9 mg/dL (ref 0.44–1.00)
Glucose, Bld: 94 mg/dL (ref 70–99)
HCT: 46 % (ref 36.0–46.0)
Hemoglobin: 15.6 g/dL — ABNORMAL HIGH (ref 12.0–15.0)
Potassium: 4.1 mmol/L (ref 3.5–5.1)
Sodium: 141 mmol/L (ref 135–145)
TCO2: 26 mmol/L (ref 22–32)

## 2023-04-28 LAB — PROTIME-INR
INR: 1 (ref 0.8–1.2)
Prothrombin Time: 13 seconds (ref 11.4–15.2)

## 2023-04-28 LAB — ETHANOL: Alcohol, Ethyl (B): <10 mg/dL (ref <10)

## 2023-04-28 LAB — CBG MONITORING, ED: Glucose-Capillary: 88 mg/dL (ref 70–99)

## 2023-04-28 LAB — APTT: aPTT: 27 seconds (ref 24–36)

## 2023-04-28 NOTE — ED Provider Notes (Signed)
Care assumed from [facility].  At time of transfer of care, patient is waiting for MRI brain to rule out acute stroke.  If MRI is reassuring, plan of care is to discuss with neurology if she needs MRI of her C-spine given the reported arm and leg symptoms today.  They did not want to get the MRI of the C-spine or T-spine initially but wanted to wait for the MRI of the brain.  Anticipate reassessment after MRI.  Care transferred to night team while awaiting results of MRI.   Khaya Theissen, Canary Brim, MD 04/29/23 [phone]

## 2023-04-28 NOTE — ED Provider Notes (Signed)
[place] EMERGENCY DEPARTMENT AT [facility] Provider Note   CSN: [id number] Arrival date & time: 04/28/23  1623     History  Chief Complaint  Patient presents with   Weakness    [name] is a 57 y.o. female history of of hypertension CAD, presented with left leg weakness that began at 930 this morning.  Patient went to her primary care provider and was referred here for stroke rule out.  Patient states she has not been able to bear weight on her leg all day and that she feels weak.  Patient denies any history of stroke, TIA, blood clots and is not on any blood thinners.  Patient denies chest pain, shortness of breath, fevers, vision changes, headache, change in sensation in her extremities, back pain, saddle anesthesia, urinary/bowel incontinence.    Home Medications Prior to Admission medications   Medication Sig Start Date End Date Taking? Authorizing Provider  amLODipine (NORVASC) 10 MG tablet Take 10 mg by mouth daily. 08/26/22  Yes [provider]  nitroGLYCERIN (NITROSTAT) 0.4 MG SL tablet Place 0.4 mg under the tongue every 5 (five) minutes as needed for chest pain. 12/08/15  Yes [provider]  ranolazine (RANEXA) 500 MG 12 hr tablet Take 500 mg by mouth daily. 06/24/22 06/19/23 Yes [provider]      Allergies    Crestor [rosuvastatin calcium], Lipitor [atorvastatin], Lisinopril, Livalo [pitavastatin], and Repatha [evolocumab]    Review of Systems   Review of Systems  Neurological:  Positive for weakness.    Physical Exam Updated Vital Signs BP (!) 132/96   Pulse 63   Temp (!) 97.2 F (36.2 C)   Resp 16   LMP 11/17/2017   SpO2 99%  Physical Exam Vitals reviewed.  Constitutional:      General: She is not in acute distress. HENT:     Head: Normocephalic and atraumatic.  Eyes:     Extraocular Movements: Extraocular movements intact.     Conjunctiva/sclera: Conjunctivae normal.     Pupils: Pupils are equal, round,  and reactive to light.  Cardiovascular:     Rate and Rhythm: Normal rate and regular rhythm.     Pulses: Normal pulses.     Heart sounds: Normal heart sounds.     Comments: 2+ bilateral radial/dorsalis pedis pulses with regular rate Pulmonary:     Effort: Pulmonary effort is normal. No respiratory distress.     Breath sounds: Normal breath sounds.  Abdominal:     Palpations: Abdomen is soft.     Tenderness: There is no abdominal tenderness. There is no guarding or rebound.  Musculoskeletal:        General: Normal range of motion.     Cervical back: Normal range of motion and neck supple.     Comments: 5 out of 5 bilateral grip/leg extension strength  Skin:    General: Skin is warm and dry.     Capillary Refill: Capillary refill takes less than 2 seconds.  Neurological:     General: No focal deficit present.     Mental Status: She is alert and oriented to person, place, and time.     Deep Tendon Reflexes:     Reflex Scores:      Patellar reflexes are 2+ on the right side and 2+ on the left side.    Comments: Sensation intact in all 4 limbs Vision grossly intact Carotid nerves III through XII intact Unable to bear weight on left leg to  ambulate  Psychiatric:        Mood and Affect: Mood normal.     ED Results / Procedures / Treatments   Labs (all labs ordered are listed, but only abnormal results are displayed) Labs Reviewed  COMPREHENSIVE METABOLIC PANEL - Abnormal; Notable for the following components:      Result Value   AST 47 (*)    ALT 113 (*)    All other components within normal limits  URINALYSIS, ROUTINE W REFLEX MICROSCOPIC - Abnormal; Notable for the following components:   Leukocytes,Ua TRACE (*)    Bacteria, UA RARE (*)    All other components within normal limits  I-STAT CHEM 8, ED - Abnormal; Notable for the following components:   Hemoglobin 15.6 (*)    All other components within normal limits  CBC WITH DIFFERENTIAL/PLATELET  ETHANOL  PROTIME-INR   APTT  RAPID URINE DRUG SCREEN, HOSP PERFORMED  CBG MONITORING, ED    EKG EKG Interpretation Date/Time:  Friday April 28 2023 16:23:48 EDT Ventricular Rate:  80 PR Interval:  170 QRS Duration:  88 QT Interval:  380 QTC Calculation: 438 R Axis:   53  Text Interpretation: Normal sinus rhythm ST & T wave abnormality, consider lateral ischemia Abnormal ECG When compared with ECG of 16-Jun-2022 15:58, PREVIOUS ECG IS PRESENT when compared to prior, faster than prior No STEMI Confirmed by Theda Belfast ([id number]) on 04/28/2023 7:51:44 PM  Radiology CT Head Wo Contrast  Result Date: 04/28/2023 CLINICAL DATA:  Left leg weakness EXAM: CT HEAD WITHOUT CONTRAST TECHNIQUE: Contiguous axial images were obtained from the base of the skull through the vertex without intravenous contrast. RADIATION DOSE REDUCTION: This exam was performed according to the departmental dose-optimization program which includes automated exposure control, adjustment of the mA and/or kV according to patient size and/or use of iterative reconstruction technique. COMPARISON:  12/24/2019 FINDINGS: Brain: No acute intracranial abnormality. Specifically, no hemorrhage, hydrocephalus, mass lesion, acute infarction, or significant intracranial injury. Vascular: No hyperdense vessel or unexpected calcification. Skull: No acute calvarial abnormality. Sinuses/Orbits: No acute findings Other: None IMPRESSION: Normal study. Electronically Signed   By: Charlett Nose M.D.   On: 04/28/2023 19:41    Procedures Procedures    Medications Ordered in ED Medications - No data to display  ED Course/ Medical Decision Making/ A&P                                 Medical Decision Making Amount and/or Complexity of Data Reviewed Labs: ordered. Radiology: ordered.   [name] 57 y.o. presented today for left leg weakness. Working DDx that I considered at this time includes, but not limited to, MSK, sciatica, CVA/TIA, cauda equina, spinal  cord lesion.  R/o DDx: pending  Review of prior external notes: 04/28/23 office visit  Unique Tests and My Interpretation:  CBC: Unremarkable CMP: Unremarkable I-STAT Chem-8: Unremarkable Ethanol: Negative APTT: Unremarkable PT/INR: Unremarkable UDS: Negative UA: Unremarkable CT head without contrast: No acute intracranial abnormalities MR brain wo contrast: pending  Discussion with Independent Historian:  Husband  Discussion of Management of Tests:  Derry Lory, MD Neurology  Risk: Pending  Risk Stratification Score: none  Staffed with Tegeler, MD  Plan: On exam patient was in no acute distress with stable vitals.  Patient was seen in triage and was unable to bear weight on her left leg but did have good strength with knee extension flexion and plantarflexion dorsiflexion.  Labs were ordered along with a CT scan and at this time we are awaiting a CT scan.  On recheck patient stated that she felt the same as when she was in triage and states that her left leg feels asleep.  Patient does have 2+ bilateral patellar reflexes and has sensation distally.  Patient denies any new symptoms from when I saw her in triage.  Patient's labs came back reassuring and patient CT was negative.  I tried walking the patient again and patient still shows signs of left leg weakness when bearing weight and trying to walk and has an unsteady gait.  Patient's reflexes were tested and patient does have bilateral 2+ patellar reflexes.  Patient states that she can feel when I touch her left lower extremity but that otherwise her leg does feel numb.  Upon chart review it appears that patient also had left hand numbness when she went to her primary care provider.  Patient stated this occurred around 3 PM when she was driving over to her primary care appointment and that her left hand is also numb as well but has good grip strength.  Neurology was consulted and recommended MR without contrast favoring stroke over  MS.  If MRI is negative then reach back out to consider MRI without contrast of C-spine.  Patient signed out to Tegeler, MD. Plan is to follow up on MR and dispo accordingly. Neuro did mention considering MR wo contrast of C spine if negative but will need to reach back out depending on what the MR brain shows.         Final Clinical Impression(s) / ED Diagnoses Final diagnoses:  None    Rx / DC Orders ED Discharge Orders     None         Remi Deter 04/28/23 2127    Tegeler, Canary Brim, MD 04/28/23 2206

## 2023-04-28 NOTE — Patient Instructions (Signed)
Please go now to [facility] for probable acute stroke vs TIA today

## 2023-04-28 NOTE — ED Notes (Signed)
To mri 

## 2023-04-28 NOTE — Assessment & Plan Note (Signed)
Lab Results  Component Value Date   HGBA1C 5.6 09/01/2022   Stable, pt to continue current medical treatment - diet , wt control

## 2023-04-28 NOTE — ED Notes (Signed)
The pt has had some lt arm and lt leg numbness since 0900am today  speech is clear  she is alert and oriented x 4  sl headache  moveds everything some pins and needles in her  lt arm and hand.

## 2023-04-28 NOTE — ED Triage Notes (Signed)
Pt presents after being sent by PCP.  Pt went to her doctor this afternoon after experiencing left sided limb numbness/weakness since 930 this morning.  Pt ambulated to triage with steady gait independently.  Pt reports numbness/weakness on the left side has not improved and her physician sent her for eval and stroke/TIA workup.

## 2023-04-28 NOTE — ED Provider Triage Note (Signed)
Emergency Medicine Provider Triage Evaluation Note  [name] , a 57 y.o. female  was evaluated in triage.  Pt complains of left leg weakness that began at 930 this morning.  Patient went to her primary care provider and was referred here for stroke rule out.  Patient states she has not been able to bear weight on her leg all day and that she feels weak.  Patient denies any history of stroke, TIA, blood clots and is not on any blood thinners.  Patient denies chest pain, shortness of breath, fevers, vision changes, headache, change in sensation in her extremities.  Review of Systems  Positive: See HPI Negative: See HPI  Physical Exam  BP (!) 184/114   Pulse 92   Temp 98.1 F (36.7 C) (Oral)   Resp 18   LMP 11/17/2017   SpO2 100%  Gen:   Awake, no distress   Resp:  Normal effort  MSK:   Unable to bear weight on left lower extremity when walking, vision grossly intact, finger-to-nose intact, equal symmetrical smile, equal symmetrical shoulder raise Other:  Sensation intact distally, pupils PERRL with EOM intact  Medical Decision Making  Medically screening exam initiated at 4:34 PM.  Appropriate orders placed.  CHATARA LUCENTE was informed that the remainder of the evaluation will be completed by another provider, this initial triage assessment does not replace that evaluation, and the importance of remaining in the ED until their evaluation is complete.  Workup initiated, patient stable at this time. CT was contacted for patient to get scanned. Pt outside tPA window if stroke is found on CT.   Netta Corrigan, PA-C 04/28/23 1637

## 2023-04-28 NOTE — Assessment & Plan Note (Signed)
Aypical, etiology unclear, should likely have troponin at ED as well

## 2023-04-28 NOTE — Assessment & Plan Note (Addendum)
Sudden onset with unusual weakness but no significant pain - high suspicion for acute TIA/CVA - pt directed immediately to [facility] ED for Code stroke eval; pt declines ambulance - will drive herself

## 2023-04-28 NOTE — Assessment & Plan Note (Signed)
Declines ecg for now, will likely have at ED

## 2023-04-28 NOTE — Progress Notes (Signed)
Patient ID: SYLA DEVOSS, female   DOB: 21-Jul-1966, 57 y.o.   MRN: [id number]        Chief Complaint: follow up left leg and hand numbness and tingling, weakness       HPI:  [name] is a 57 y.o. female here with c/o sudden onset tingling numbness to whole left leg at 0830 today, tried to stand and noticed LLE weakness, but was able to walk.  Co worker helped her to get an appt here.  On the way her she also has tingling numbness to the left hand.  No HA or falls.  No neck or lower back or other significant pain.  No priorhx of CVA  Has had PVCs and occasional palps in the past, and does not feel more palps than usual today.   Pt denies polydipsia, polyuria, or new focal neuro s/s.   Did have an episode of left upper chest pain earlier today < 30 min but though this was her usual CP and her cardiologist has always encouraged her to not be afraid to see her PCP first.         Wt Readings from Last 3 Encounters:  04/28/23 162 lb 2 oz (73.5 kg)  02/10/23 165 lb 6.4 oz (75 kg)  01/03/23 162 lb (73.5 kg)   BP Readings from Last 3 Encounters:  04/28/23 130/74  02/10/23 125/77  01/03/23 132/84         Past Medical History:  Diagnosis Date   Acute pancreatitis 2011   Allergy    Anemia    CAD (coronary artery disease) 2009   Dr Linton Ham -[place], Heart Cath   Family history of adverse reaction to anesthesia    "mother and brother have difficulty waking up out from it"   Family history of breast cancer    Family history of colon cancer    Family history of kidney cancer    Family history of ovarian cancer    Family history of prostate cancer    GERD (gastroesophageal reflux disease)    Hyperlipidemia    Hypertension    Hypothyroidism    "recently dx'd" (12/07/2015)   PVC (premature ventricular contraction)    Statin intolerance    STD (sexually transmitted disease)    CHL 20 yrs ago   Thyroid nodule 2010   "precancerous; thyroid treated with radiation"   Tubular adenoma  of colon 06/2015   Past Surgical History:  Procedure Laterality Date   CARDIAC CATHETERIZATION     unable to do cardiac cath but does stress echo 11/22/11   CARDIAC CATHETERIZATION N/A 12/04/2015   Procedure: Left Heart Cath and Coronary Angiography;  Surgeon: Yates Decamp, MD;  Location: [facility] INVASIVE CV LAB;  Service: Cardiovascular;  Laterality: N/A;   CARDIAC CATHETERIZATION N/A 12/04/2015   Procedure: Intravascular Pressure Wire/FFR Study;  Surgeon: Yates Decamp, MD;  Location: [facility] INVASIVE CV LAB;  Service: Cardiovascular;  Laterality: N/A;   CARDIAC CATHETERIZATION N/A 12/07/2015   Procedure: Coronary Stent Intervention;  Surgeon: Yates Decamp, MD;  Location[facility] INVASIVE CV LAB;  Service: Cardiovascular;  Laterality: N/A;   COLONOSCOPY     CORONARY STENT PLACEMENT     EYE MUSCLE SURGERY Left 1969   LAPAROSCOPIC CHOLECYSTECTOMY  2008   LEFT HEART CATH AND CORONARY ANGIOGRAPHY N/A 11/24/2017   Procedure: LEFT HEART CATH AND CORONARY ANGIOGRAPHY;  Surgeon: Elder Negus, MD;  Location: [facility] INVASIVE CV LAB;  Service: Cardiovascular;  Laterality: N/A;   LEFT HEART CATH  AND CORONARY ANGIOGRAPHY N/A 06/17/2022   Procedure: LEFT HEART CATH AND CORONARY ANGIOGRAPHY;  Surgeon: Elder Negus, MD;  Location: [facility] INVASIVE CV LAB;  Service: Cardiovascular;  Laterality: N/A;   POLYPECTOMY     TUBAL LIGATION Bilateral 1995   UPPER GASTROINTESTINAL ENDOSCOPY     VENTRICULAR ABLATION SURGERY  2006,2010   times 2, follow-up for v-tack    reports that she has never smoked. She has never used smokeless tobacco. She reports that she does not drink alcohol and does not use drugs. family history includes Breast cancer in her paternal aunt; Colon cancer (age of onset: 24) in her father; Colon polyps in her mother and sister; Crohn's disease in her daughter; Diabetes in her brother, maternal grandmother, and mother; Heart attack in her cousin and father; Hypertension in her father and mother; Kidney cancer (age of  onset: 46) in her cousin; Ovarian cancer (age of onset: 66) in her sister; Prostate cancer in her father; Stomach cancer in her maternal grandmother. Allergies  Allergen Reactions   Crestor [Rosuvastatin Calcium] Other (See Comments)    Muscle aches and pains   Lipitor [Atorvastatin] Other (See Comments)    Muscle aches and pains   Lisinopril Cough   Livalo [Pitavastatin] Other (See Comments)    Muscle aches and pain   Repatha [Evolocumab] Hives, Rash and Other (See Comments)    Back pain   Current Outpatient Medications on File Prior to Visit  Medication Sig Dispense Refill   acetaminophen (TYLENOL) 500 MG tablet Take 1,000 mg by mouth as needed (pain).     Alirocumab 150 MG/ML SOAJ Inject into the skin.     amLODipine (NORVASC) 10 MG tablet Take 10 mg by mouth daily.     aspirin EC 81 MG tablet Take 1 tablet (81 mg total) by mouth daily. Swallow whole. 150 tablet 2   Cholecalciferol (VITAMIN D) 2000 units tablet Take 2,000 Units by mouth daily.     nitroGLYCERIN (NITROSTAT) 0.4 MG SL tablet Place 0.4 mg under the tongue every 5 (five) minutes as needed.     ranolazine (RANEXA) 500 MG 12 hr tablet Take by mouth.     cyclobenzaprine (FLEXERIL) 5 MG tablet Take 1 tablet (5 mg total) by mouth 3 (three) times daily as needed for muscle spasms. (Patient not taking: Reported on 04/28/2023) 30 tablet 1   naproxen (NAPROSYN) 500 MG tablet Take 1 tablet (500 mg total) by mouth 2 (two) times daily as needed for moderate pain (neck pain). (Patient not taking: Reported on 04/28/2023) 30 tablet 1   No current facility-administered medications on file prior to visit.        ROS:  All others reviewed and negative.  Objective        PE:  BP 130/74   Pulse 72   Temp 98.2 F (36.8 C) (Oral)   Ht 5\' 2"  (1.575 m)   Wt 162 lb 2 oz (73.5 kg)   LMP 11/17/2017   SpO2 99%   BMI 29.65 kg/m                 Constitutional: Pt appears in NAD               HENT: Head: NCAT.                Right Ear:  External ear normal.                 Left Ear: External ear normal.  Eyes: . Pupils are equal, round, and reactive to light. Conjunctivae and EOM are normal               Nose: without d/c or deformity               Neck: Neck supple. Gross normal ROM               Cardiovascular: Normal rate and regular rhythm.                 Pulmonary/Chest: Effort normal and breath sounds without rales or wheezing.                Abd:  Soft, NT, ND, + BS, no organomegaly               Neurological: Pt is alert. At baseline orientation, right handed, motor grossly intact except for LLE 4-4+/5 (new for her); may have trace decreased left grip as well               Skin: Skin is warm. No rashes, no other new lesions, LE edema - none               Psychiatric: Pt behavior is normal without agitation   Micro: none  Cardiac tracings I have personally interpreted today:  none  Pertinent Radiological findings (summarize): none   Lab Results  Component Value Date   WBC 6.2 09/01/2022   HGB 14.2 09/01/2022   HCT 41.3 09/01/2022   PLT 314.0 09/01/2022   GLUCOSE 85 09/01/2022   CHOL 263 (H) 09/01/2022   TRIG 88.0 09/01/2022   HDL 82.30 09/01/2022   LDLCALC 163 (H) 09/01/2022   ALT 11 09/01/2022   AST 15 09/01/2022   NA 141 09/01/2022   K 3.6 09/01/2022   CL 102 09/01/2022   CREATININE 0.91 09/01/2022   BUN 10 09/01/2022   CO2 28 09/01/2022   TSH 21.37 (H) 09/01/2022   INR 1.0 07/11/2020   HGBA1C 5.6 09/01/2022   Assessment/Plan:  ARDEL JAGGER is a 57 y.o. Black or African American [2] female with  has a past medical history of Acute pancreatitis (2011), Allergy, Anemia, CAD (coronary artery disease) (2009), Family history of adverse reaction to anesthesia, Family history of breast cancer, Family history of colon cancer, Family history of kidney cancer, Family history of ovarian cancer, Family history of prostate cancer, GERD (gastroesophageal reflux disease), Hyperlipidemia,  Hypertension, Hypothyroidism, PVC (premature ventricular contraction), Statin intolerance, STD (sexually transmitted disease), Thyroid nodule (2010), and Tubular adenoma of colon (06/2015).  Left leg paresthesias Sudden onset with unusual weakness but no significant pain - high suspicion for acute TIA/CVA - pt directed immediately to [facility] ED for Code stroke eval; pt declines ambulance - will drive herself  Palpitations Declines ecg for now, will likely have at ED  Chest pain, atypical Aypical, etiology unclear, should likely have troponin at ED as well  HYPERGLYCEMIA Lab Results  Component Value Date   HGBA1C 5.6 09/01/2022   Stable, pt to continue current medical treatment - diet , wt control  Followup: No follow-ups on file.  Oliver Barre, MD 04/28/2023 4:15 PM [facility] Internal Medicine

## 2023-04-29 NOTE — ED Provider Notes (Signed)
Blood pressure 126/78, pulse (!) 54, temperature 97.8 F (36.6 C), temperature source Oral, resp. rate 16, last menstrual period 11/17/2017, SpO2 99%.  Assuming care from Dr. Rush Landmark.  In short, [name] is a 57 y.o. female with a chief complaint of Weakness .  Refer to the original H&P for additional details.  The current plan of care is to follow up on MRI and reassess.  01:22 AM MRI reviewed and discussed with Neuro/radiology. Similar to 2021 MRI in comparison. No further imaging on an emergent basis advised. Plan for outpatient Neuro referral.     Maia Plan, MD 04/29/23 [phone]

## 2023-04-29 NOTE — Discharge Instructions (Signed)
Your MRI did not show a stroke. After coordination with our Neurology team, they would like for you to see a Neurology group as an outpatient. I have placed a referral in our system to help establish this appointment.

## 2023-05-03 ENCOUNTER — Ambulatory Visit: Payer: PRIVATE HEALTH INSURANCE | Admitting: Neurology

## 2023-05-03 ENCOUNTER — Encounter: Payer: Self-pay | Admitting: Neurology

## 2023-05-03 VITALS — BP 135/82 | HR 72 | Ht 62.0 in | Wt 160.5 lb

## 2023-05-03 DIAGNOSIS — R269 Unspecified abnormalities of gait and mobility: Secondary | ICD-10-CM | POA: Diagnosis not present

## 2023-05-03 DIAGNOSIS — R29898 Other symptoms and signs involving the musculoskeletal system: Secondary | ICD-10-CM

## 2023-05-03 DIAGNOSIS — R202 Paresthesia of skin: Secondary | ICD-10-CM | POA: Diagnosis not present
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
## 2023-06-28 ENCOUNTER — Ambulatory Visit: Payer: PRIVATE HEALTH INSURANCE | Admitting: Neurology

## 2023-06-28 ENCOUNTER — Ambulatory Visit (INDEPENDENT_AMBULATORY_CARE_PROVIDER_SITE_OTHER): Payer: PRIVATE HEALTH INSURANCE | Admitting: Neurology

## 2023-06-28 VITALS — BP 112/73 | HR 71 | Ht 62.0 in | Wt 160.6 lb

## 2023-06-28 DIAGNOSIS — R269 Unspecified abnormalities of gait and mobility: Secondary | ICD-10-CM

## 2023-06-28 DIAGNOSIS — R202 Paresthesia of skin: Secondary | ICD-10-CM

## 2023-06-28 DIAGNOSIS — R29898 Other symptoms and signs involving the musculoskeletal system: Secondary | ICD-10-CM

## 2023-06-28 NOTE — Procedures (Signed)
Full Name: Cyan Clippinger Gender: Female MRN #: [id number] Date of Birth: Dec 25, 1965    Visit Date: 06/28/2023 09:35 Age: 57 Years Examining Physician: Dr. Levert Feinstein Referring Physician: Dr. Levert Feinstein Height: 5 feet 2 inch History: 57 years old with low back pain, left hand paresthesia  Summary of the tests: Nerve conduction study: Bilateral sural, superficial peroneal sensory; tibial and peroneal to EDB motor studies were normal.   Electromyography: Selected needle examination of bilateral lower extremity muscles and lumbar paraspinal muscles were normal.   Conclusion: This is a normal study. There is no evidence of large fiber neuropathy or bilateral lumbar radiculopathy.    ------------------------------- Forrestine Him.D.Ph.D.  [facility] [address], [address] [place], [place] [address] Tel: [phone] Fax: [phone]  Verbal informed consent was obtained from the patient, patient was informed of potential risk of procedure, including bruising, bleeding, hematoma formation, infection, muscle weakness, muscle pain, numbness, among others.        MNC    Nerve / Sites Muscle Latency Ref. Amplitude Ref. Rel Amp Segments Distance Velocity Ref. Area    ms ms mV mV %  cm m/s m/s mVms  L Peroneal - EDB     Ankle EDB 3.8 <=6.5 7.0 >=2.0 100 Ankle - EDB 9   23.2     Fib head EDB 8.8  5.5  79.1 Fib head - Ankle 24 48 >=44 17.3     Pop fossa EDB 10.5  5.6  102 Pop fossa - Fib head 10.6 64 >=44 18.2         Pop fossa - Ankle      R Peroneal - EDB     Ankle EDB 4.5 <=6.5 7.2 >=2.0 100 Ankle - EDB 9   24.2     Fib head EDB 9.6  6.4  88.7 Fib head - Ankle 25.6 50 >=44 22.4     Pop fossa EDB 11.2  6.1  96.1 Pop fossa - Fib head 10 62 >=44 24.4         Pop fossa - Ankle      L Tibial - AH     Ankle AH 4.7 <=5.8 7.0 >=4.0 100 Ankle - AH 9   12.7     Pop fossa AH 11.2  5.8  83.4 Pop fossa - Ankle 33 51 >=41 15.5  R Tibial - AH     Ankle AH 5.2 <=5.8  13.9 >=4.0 100 Ankle - AH 9   23.3     Pop fossa AH 11.4  11.4  81.9 Pop fossa - Ankle 34 55 >=41 22.7             SNC    Nerve / Sites Rec. Site Peak Lat Ref.  Amp Ref. Segments Distance    ms ms V V  cm  L Sural - Ankle (Calf)     Calf Ankle 3.4 <=4.4 24 >=6 Calf - Ankle 14  R Sural - Ankle (Calf)     Calf Ankle 3.2 <=4.4 10 >=6 Calf - Ankle 14  L Superficial peroneal - Ankle     Lat leg Ankle 3.3 <=4.4 9 >=6 Lat leg - Ankle 14  R Superficial peroneal - Ankle     Lat leg Ankle 3.6 <=4.4 11 >=6 Lat leg - Ankle 14             F  Wave    Nerve F Lat Ref.   ms  ms  L Tibial - AH 41.5 <=56.0  R Tibial - AH 47.1 <=56.0         EMG Summary Table    Spontaneous MUAP Recruitment  Muscle IA Fib PSW Fasc Other Amp Dur. Poly Pattern  R. Tibialis anterior Normal None None None _______ Normal Normal Normal Normal  R. Tibialis posterior Normal None None None _______ Normal Normal Normal Normal  R. Peroneus longus Normal None None None _______ Normal Normal Normal Normal  R. Gastrocnemius (Medial head) Normal None None None _______ Normal Normal Normal Normal  R. Vastus lateralis Normal None None None _______ Normal Normal Normal Normal  L. Tibialis anterior Normal None None None _______ Normal Normal Normal Normal  L. Tibialis posterior Normal None None None _______ Normal Normal Normal Normal  L. Peroneus longus Normal None None None _______ Normal Normal Normal Normal  L. Gastrocnemius (Medial head) Normal None None None _______ Normal Normal Normal Normal  L. Vastus lateralis Normal None None None _______ Normal Normal Normal Normal  R. Lumbar paraspinals (low) Normal None None None _______ Normal Normal Normal Normal  R. Lumbar paraspinals (mid) Normal None None None _______ Normal Normal Normal Normal  L. Lumbar paraspinals (low) Normal None None None _______ Normal Normal Normal Normal  L. Lumbar paraspinals (mid) Normal None None None _______ Normal Normal Normal Normal

## 2023-09-15 NOTE — Progress Notes (Signed)
 [facility] cardiology EP office Visit  Note  PCP: Marolyn LULLA Noel, MD   Portion of this note were dictated using [employer] voices recognition software. Please disregard any errors in transcription .    Reason for Visit:      Mrs. [name] is here for yearly follow-up management of her known coronary artery disease that she has had stent placement 2017 also as PVCs.,  Essential hypertension and statin intolerance.  He is symptomatic with PVCs.  Is not clear how many percent burden of PVCs she has.  But otherwise denies chest pain or shortness of breath.     Assessment/Plan   1.  Frequent PVCs morphology is left-sided basal lateral of mitral valve: Associated with palpitation.  Discussed with her we are going to obtain a Zio patch for 2 weeks in order to quantify percentage of PVCs.  If it is above 12% to 15% then proceed ablate it.  Otherwise with low percentage then we are going to treat her with medication.  We are not going to give her medication now until we get that Zio patch.  And then after that we are going to treat her with medication.  Presently she is taking ranolazine 500 mg twice daily   2.  Known coronary artery disease status post stent to LAD.  Denies angina.  Continue with ranolazine 500 mg twice daily, aspirin .    3.  Hypothyroidism: She is on Synthroid  managed by her primary physician.    4.  Essential hypertension.  Well-controlled on medication.  Advise continue with amlodipine  10 mg daily.     5.  Intolerance to statins.  She will follow-up with Dr. Launie.  She was a started on Repatha but apparently she had skin allergy        PLAN: We are going to obtain a Zio patch for 2 weeks to quantify her PVCs burden.  And then as soon as we get results of her Zio patch then we will make decision to proceed with medication or if percentage is high then she will require ablation.  Continue with same management for now.  She  will follow-up with her other providers.  Will see her back in 4 months.   Orders   Orders Placed This Encounter  Procedures  . Holter monitor - 8-14 days  . ECG 12 lead  . No follow-ups on file.  Portions of this note may have been created utilizing [employer] dictation software and may contain transcription errors that were missed during proofreading.  HPI   [name]   returns for follow up with past medical history of known coronary artery disease status post stent to LAD 2017.  She continue with guideline medical therapy including aspirin  and ranolazine 500 mg twice daily.  She denies chest pain or shortness of breath, normal left ventricular function.  She has had also hypercholesterolemia and she is intolerance to statin and Repatha secondary to skin allergy.  Essential hypertension that is well-controlled on medication she is on amlodipine  10 mg daily.  She has been complaining of palpitation.  EKG shows that she has underlying sinus rhythm and with rate of 64 bpm also has frequent PVCs that morphology is left-sided.  Presently she is taking ranolazine 500 mg twice daily.  And we cannot give her flecainide since she has coronary artery disease.  We are going to obtain a Zio patch to quantify her percentage of PVCs and then we will  see her in 4 months.  To discuss about ablation if his percentage is high otherwise will treat her with medication.  I doubt that she can tolerate medication.  Heart is already 64 bpm.  In past her EF has been normal supranormal 72 bpm.    Physical Examination:  VITAL SIGNS:  Vitals:   09/15/23 1010  BP: 114/70  Pulse: 67  SpO2: 98%   @WEIGHTVITALS @  General:  Resting comfortably in NAD Neuro:  Alert & oriented X 3.   Resp:  Resp even and nonlabored.  Lungs sounds clear throughout CV:  Regular rate and rhythm,no murmur , No gallops or rubs GI:  Soft, nontender, non-distended, NABS Ext:  No edema Neck:  No JVD Pulses:  2+ and symmetrical  upper and lower extremities  Hildegard Rinks, MD, MD, [facility] 09/15/2023, 12:31 PM   Past Medical History,  Past Surgical History, Family History, Social History, Medications, Allergies, Social History: As reviewed in EPIC  Review of Systems: All positive and pertinent negatives are noted in the HPI; otherwise all other systems are negative  Objective Data Reviewed During this Patient Encounter:   EKG: Sinus rhythm with frequent PVCs  Lab Results  Component Value Date   CHOL 223 (H) 10/04/2021   TRIG 134 10/04/2021   HDL 59 10/04/2021   Lab Results  Component Value Date   WBC 6.8 07/07/2022   HGB 15.2 07/07/2022   HCT 45.3 (H) 07/07/2022   PLT 293 07/07/2022   Lab Results  Component Value Date   NA 138 07/07/2022   K 4.4 07/07/2022   CL 102 07/07/2022   CO2 30 07/07/2022   BUN 10 07/07/2022   CREATININE 0.92 07/07/2022   AST 14 06/24/2022   ALT 10 06/24/2022      Current Outpatient Medications:  .  acetaminophen  (TYLENOL ) 500 mg tablet, Take 1,000 mg by mouth., Disp: , Rfl:  .  amLODIPine  (NORVASC ) 10 mg tablet, Take 1 tablet (10 mg total) by mouth nightly., Disp: 90 tablet, Rfl: 3 .  aspirin  81 mg EC tablet, Take 81 mg by mouth., Disp: , Rfl:  .  cholecalciferol  (VITAMIN D3) 2,000 unit tablet, Take 2,000 Units by mouth., Disp: , Rfl:  .  naproxen  (NAPROSYN ) 500 mg tablet, Take 1 tablet (500 mg total) by mouth 2 (two)  times daily as needed for moderate pain, Disp: 30 tablet, Rfl: 1 .  nitroglycerin  (NITROSTAT ) 0.4 mg SL tablet, Place 0.4 mg under the tongue., Disp: , Rfl:  .  ranolazine (RANEXA) 500 mg 12 hr tablet, Take 1 tablet (500 mg total) by mouth 2 times daily. (Patient taking differently: Take 500 mg by mouth 2 (two) times a day. Pt has been taking 1 time daily), Disp: 180 tablet, Rfl: 3

## 2023-09-25 NOTE — Progress Notes (Signed)
 14 Day Zio patch applied per Dr. Meldon for palpitations, PVC's.

## 2023-11-16 ENCOUNTER — Ambulatory Visit: Payer: PRIVATE HEALTH INSURANCE | Admitting: Surgical

## 2023-11-16 ENCOUNTER — Encounter: Payer: Self-pay | Admitting: Surgical

## 2023-11-16 VITALS — BP 123/76 | HR 64

## 2023-11-16 DIAGNOSIS — M546 Pain in thoracic spine: Secondary | ICD-10-CM

## 2023-11-16 DIAGNOSIS — G8929 Other chronic pain: Secondary | ICD-10-CM

## 2023-11-16 DIAGNOSIS — N62 Hypertrophy of breast: Secondary | ICD-10-CM

## 2023-11-16 MED ORDER — ONDANSETRON HCL 4 MG PO TABS: 4.0000 mg | ORAL_TABLET | Freq: Three times a day (TID) | ORAL | 0 refills | Status: AC | PRN

## 2023-11-16 MED ORDER — CEPHALEXIN 500 MG PO CAPS: 500.0000 mg | ORAL_CAPSULE | Freq: Four times a day (QID) | ORAL | 0 refills | Status: AC

## 2023-11-16 MED ORDER — OXYCODONE HCL 5 MG PO TABS: 5.0000 mg | ORAL_TABLET | Freq: Four times a day (QID) | ORAL | 0 refills | Status: AC | PRN

## 2023-11-16 NOTE — Progress Notes (Signed)
 Patient ID: [name], female    DOB: 04/21/1966, 58 y.o.   MRN: [id number]  Chief Complaint  Patient presents with   Pre-op Exam      ICD-10-CM   1. Chronic bilateral thoracic back pain  M54.6    G89.29     2. Symptomatic mammary hypertrophy  N62       History of Present Illness: [name] is a 58 y.o.  female  with a history of macromastia.  She presents for preoperative evaluation for upcoming procedure, Bilateral Breast Reduction with possible liposuction, scheduled for 11/27/2023 with Dr.  Ulice Bold  The patient has not had problems with anesthesia. No history of DVT/PE.  No family history of DVT/PE.  No family or personal history of bleeding or clotting disorders.  Patient is not currently taking any blood thinners.  No history of CVA/MI.   Summary of Previous Visit: STN is 30 cm bilaterally, preoperative bra size equals D cup, would like to be a B or C cup.  Estimated excess breast tissue to be removed at time of surgery: 400-450 g  Job: Presenter, broadcasting -mostly desk work, planning 1 week out of work.  PMH Significant for: Patient has history of coronary artery disease, stent placed LAD in 2017.  History of PVCs, hypertension, statin intolerance.  She did recently see cardiology on 09/15/2023 to discuss frequent PVCs.  Discussed Zio patch for 2 weeks, possible need for ablation or medication management pending percentage of PVCs.  She is on ASA 81 mg daily prescribed by her cardiologist. She reports she is not currently taking naproxen  Of note patient was seen in the ED on 04/2023 for evaluation of left leg weakness and paresthesias.  CT was negative.  MRI similar in comparison to 2021 imaging.  Patient had outpatient neuro referral.  Past Medical History: Allergies: Allergies  Allergen Reactions   Crestor [Rosuvastatin Calcium] Other (See Comments)    Muscle aches and pains   Lipitor [Atorvastatin] Other (See Comments)    Muscle aches and pains    Lisinopril Cough   Livalo [Pitavastatin] Other (See Comments)    Muscle aches and pain   Repatha [Evolocumab] Hives, Rash and Other (See Comments)    Back pain    Current Medications:  Current Outpatient Medications:    amLODipine (NORVASC) 10 MG tablet, Take 10 mg by mouth daily., Disp: , Rfl:    cephALEXin (KEFLEX) 500 MG capsule, Take 1 capsule (500 mg total) by mouth 4 (four) times daily for 3 days., Disp: 12 capsule, Rfl: 0   nitroGLYCERIN (NITROSTAT) 0.4 MG SL tablet, Place 0.4 mg under the tongue every 5 (five) minutes as needed for chest pain., Disp: , Rfl:    ondansetron (ZOFRAN) 4 MG tablet, Take 1 tablet (4 mg total) by mouth every 8 (eight) hours as needed for nausea or vomiting., Disp: 20 tablet, Rfl: 0   oxyCODONE (OXY IR/ROXICODONE) 5 MG immediate release tablet, Take 1 tablet (5 mg total) by mouth every 6 (six) hours as needed for up to 5 days for severe pain (pain score 7-10)., Disp: 20 tablet, Rfl: 0   ranolazine (RANEXA) 500 MG 12 hr tablet, Take 500 mg by mouth daily., Disp: , Rfl:   Past Medical Problems: Past Medical History:  Diagnosis Date   Acute pancreatitis 2011   Allergy    Anemia    CAD (coronary artery disease) 2009   Dr Linton Ham Evalee Jefferson, Heart Cath   Family history of adverse  reaction to anesthesia    "mother and brother have difficulty waking up out from it"   Family history of breast cancer    Family history of colon cancer    Family history of kidney cancer    Family history of ovarian cancer    Family history of prostate cancer    GERD (gastroesophageal reflux disease)    Hyperlipidemia    Hypertension    Hypothyroidism    "recently dx'd" (12/07/2015)   PVC (premature ventricular contraction)    Statin intolerance    STD (sexually transmitted disease)    CHL 20 yrs ago   Thyroid nodule 2010   "precancerous; thyroid treated with radiation"   Tubular adenoma of colon 06/2015    Past Surgical History: Past Surgical History:  Procedure  Laterality Date   CARDIAC CATHETERIZATION     unable to do cardiac cath but does stress echo 11/22/11   CARDIAC CATHETERIZATION N/A 12/04/2015   Procedure: Left Heart Cath and Coronary Angiography;  Surgeon: Yates Decamp, MD;  Location: [facility] INVASIVE CV LAB;  Service: Cardiovascular;  Laterality: N/A;   CARDIAC CATHETERIZATION N/A 12/04/2015   Procedure: Intravascular Pressure Wire/FFR Study;  Surgeon: Yates Decamp, MD;  Location: [facility] INVASIVE CV LAB;  Service: Cardiovascular;  Laterality: N/A;   CARDIAC CATHETERIZATION N/A 12/07/2015   Procedure: Coronary Stent Intervention;  Surgeon: Yates Decamp, MD;  Location: [facility] INVASIVE CV LAB;  Service: Cardiovascular;  Laterality: N/A;   COLONOSCOPY     CORONARY STENT PLACEMENT     EYE MUSCLE SURGERY Left 1969   LAPAROSCOPIC CHOLECYSTECTOMY  2008   LEFT HEART CATH AND CORONARY ANGIOGRAPHY N/A 11/24/2017   Procedure: LEFT HEART CATH AND CORONARY ANGIOGRAPHY;  Surgeon: Elder Negus, MD;  Location: [facility] INVASIVE CV LAB;  Service: Cardiovascular;  Laterality: N/A;   LEFT HEART CATH AND CORONARY ANGIOGRAPHY N/A 06/17/2022   Procedure: LEFT HEART CATH AND CORONARY ANGIOGRAPHY;  Surgeon: Elder Negus, MD;  Location: [facility] INVASIVE CV LAB;  Service: Cardiovascular;  Laterality: N/A;   POLYPECTOMY     TUBAL LIGATION Bilateral 1995   UPPER GASTROINTESTINAL ENDOSCOPY     VENTRICULAR ABLATION SURGERY  2006,2010   times 2, follow-up for v-tack    Social History: Social History   Socioeconomic History   Marital status: Married    Spouse name: lynwood   Number of children: 4   Years of education: Not on file   Highest education level: Bachelor's degree (e.g., BA, AB, BS)  Occupational History   Occupation: Dentist at [employer]  Tobacco Use   Smoking status: Never   Smokeless tobacco: Never  Vaping Use   Vaping status: Never Used  Substance and Sexual Activity   Alcohol use: No   Drug use: No   Sexual  activity: Yes    Partners: Male    Birth control/protection: Surgical, Post-menopausal    Comment: BTL  Other Topics Concern   Not on file  Social History Narrative   FAMILY HISTORY   History of hypertension   History of prostate cancer 1st degree relative <50   D, S ovar. [place]      Regular exercise - YES      GYN Dr Allena Katz - [place]   Social Drivers of Health   Financial Resource Strain: Not on file  Food Insecurity: No Food Insecurity (06/18/2022)   Hunger Vital Sign    Worried About Running Out of Food in the Last Year: Never true  Ran Out of Food in the Last Year: Never true  Transportation Needs: No Transportation Needs (06/18/2022)   PRAPARE - Administrator, Civil Service (Medical): No    Lack of Transportation (Non-Medical): No  Physical Activity: Not on file  Stress: Not on file  Social Connections: Not on file  Intimate Partner Violence: Not At Risk (06/18/2022)   Humiliation, Afraid, Rape, and Kick questionnaire    Fear of Current or Ex-Partner: No    Emotionally Abused: No    Physically Abused: No    Sexually Abused: No    Family History: Family History  Problem Relation Age of Onset   Hypertension Mother    Diabetes Mother    Colon polyps Mother    Hypertension Father    Prostate cancer Father    Heart attack Father    Colon cancer Father 25   Ovarian cancer Sister 63   Colon polyps Sister    Diabetes Brother    Breast cancer Paternal Aunt        dx. 50s   Diabetes Maternal Grandmother    Stomach cancer Maternal Grandmother    Crohn's disease Daughter    Heart attack Cousin    Kidney cancer Cousin 37   Esophageal cancer Neg Hx    Rectal cancer Neg Hx     Review of Systems: Review of Systems  Constitutional: Negative.   Respiratory: Negative.    Cardiovascular:  Negative for chest pain.  Gastrointestinal: Negative.   Genitourinary: Negative.   Musculoskeletal: Negative.   Neurological: Negative.     Physical Exam: Vital Signs BP  123/76 (BP Location: Left Arm, Patient Position: Sitting, Cuff Size: Large)   Pulse 64   LMP 11/17/2017   SpO2 99%   Physical Exam Constitutional:      General: Not in acute distress.    Appearance: Normal appearance. Not ill-appearing.  HENT:     Head: Normocephalic and atraumatic.  Eyes:     Pupils: Pupils are equal, round Neck:     Musculoskeletal: Normal range of motion.  Cardiovascular:     Rate and Rhythm: Normal rate    Pulses: Normal pulses.  Pulmonary:     Effort: Pulmonary effort is normal. No respiratory distress.  Musculoskeletal: Normal range of motion.  Skin:    General: Skin is warm and dry.     Findings: No erythema or rash.  Neurological:     General: No focal deficit present.     Mental Status: Alert and oriented to person, place, and time. Mental status is at baseline.     Motor: No weakness.  Psychiatric:        Mood and Affect: Mood normal.        Behavior: Behavior normal.    Assessment/Plan: The patient is scheduled for bilateral breast reduction with Dr. Ulice Bold.  Risks, benefits, and alternatives of procedure discussed, questions answered and consent obtained.    Smoking Status: Non-smoker; Counseling Given?  N/A Last Mammogram: June 2024; Results: Negative  Caprini Score: 4, moderate; Risk Factors include: Age, BMI > 25, and length of planned surgery. Recommendation for mechanical prophylaxis. Encourage early ambulation.   Pictures obtained: @consult   Post-op Rx sent to pharmacy: Oxycodone, Zofran, Keflex  Patient was provided with the breast reduction and General Surgical Risk consent document and Pain Medication Agreement prior to their appointment.  They had adequate time to read through the risk consent documents and Pain Medication Agreement. We also discussed them in person together during  this preop appointment. All of their questions were answered to their satisfaction.  Recommended calling if they have any further questions.  Risk  consent form and Pain Medication Agreement to be scanned into patient's chart.  The risk that can be encountered with breast reduction were discussed and include the following but not limited to these:  Breast asymmetry, fluid accumulation, firmness of the breast, inability to breast feed, loss of nipple or areola, skin loss, decrease or no nipple sensation, fat necrosis of the breast tissue, bleeding, infection, healing delay.  There are risks of anesthesia, changes to skin sensation and injury to nerves or blood vessels.  The muscle can be temporarily or permanently injured.  You may have an allergic reaction to tape, suture, glue, blood products which can result in skin discoloration, swelling, pain, skin lesions, poor healing.  Any of these can lead to the need for revisonal surgery or stage procedures.  A reduction has potential to interfere with diagnostic procedures.  Nipple or breast piercing can increase risks of infection.  This procedure is best done when the breast is fully developed.  Changes in the breast will continue to occur over time.  Pregnancy can alter the outcomes of previous breast reduction surgery, weight gain and weigh loss can also effect the long term appearance.   Will send cardiac clearance today, discussed with patient she will need to hold aspirin 1 week prior to surgery with clearance from her cardiologist.  Asked patient to also please call cardiology and notify them that she is having surgery, her surgery is in 7 business days so it is possible that we may not receive clearance back in time which could result in surgery being postponed.  Electronically signed by: Kermit Balo Armanda Forand, PA-C 11/16/2023 2:52 PM

## 2023-11-21 ENCOUNTER — Telehealth: Payer: Self-pay | Admitting: Surgical

## 2023-11-21 NOTE — Telephone Encounter (Signed)
 Calling from [facility] on a medical clearance they received for patient, but it wasn't circled that she was cleared for surgery and the form had not been signed  Pls redo and send again

## 2023-11-21 NOTE — Telephone Encounter (Signed)
 Spoke with nurse confirmed she got attachment with recommendations from Will answering all questions on the form. Along with note from Will had him sign form and resent as requested.

## 2023-11-21 NOTE — Telephone Encounter (Signed)
 Spoke with patient, notified her that we received clearance note from her cardiology office, discussed that patient has option to hold aspirin pending on surgeon's preference, Traci Sermon, RN spoke with Dr. Ulice Bold and Dr. Ulice Bold recommends holding aspirin prior to surgery.  Patient was notified of this today.  Discussed with patient that we did not receive back a signed clearance form from her cardiology office and the surgical center is requesting the form be signed, discussed with her that surgery could be postponed if that form is not received in time.  She was understanding.  She reported that she was going to call the cardiology office as well to notify them.

## 2023-11-22 NOTE — Telephone Encounter (Signed)
 Left message that the paperwork she is asking about has been faxed to the requesting office.

## 2023-11-22 NOTE — Telephone Encounter (Signed)
 Pt calling in regards to Medical Clearance paperwork.  Cb# [phone]

## 2023-11-23 ENCOUNTER — Telehealth: Payer: Self-pay | Admitting: Surgical

## 2023-11-23 ENCOUNTER — Telehealth: Payer: Self-pay

## 2023-11-23 NOTE — Telephone Encounter (Signed)
 Faxed surgical clearance form to patient's pcp, Jacinta Shoe, MD. Received fax success confirmation.

## 2023-11-23 NOTE — Telephone Encounter (Signed)
 Called patient this morning at 10:30 AM to discuss aspirin prior to surgery.  Discussed with patient we did receive signed surgical clearance from cardiology so she is okay to proceed with surgery.  I also discussed with her that she should continue her aspirin prior to surgery.   Discussed with patient that she was initially told yesterday to hold her aspirin based on Dr. Kittie Plater recommendation, but received notification today from Dr. Ulice Bold that patient should continue aspirin.  Patient was agreeable with this plan.  All of her questions were answered to her content.

## 2023-11-27 ENCOUNTER — Other Ambulatory Visit: Payer: Self-pay | Admitting: Plastic Surgery

## 2023-11-27 DIAGNOSIS — N62 Hypertrophy of breast: Secondary | ICD-10-CM | POA: Diagnosis not present

## 2023-11-29 LAB — SURGICAL PATHOLOGY

## 2023-12-04 NOTE — Progress Notes (Unsigned)
 Patient is a 58 y.o.-year-old female status post bilateral breast reduction with Dr.  Ulice Bold. Patient is 1 week postop.  She reports she is doing really well, reports pain is overall well-controlled.  Today is about 5 out of 10.  She reports that she has not needed to take any of the oxycodone prescribed.  She does report that most of her pain is in the lateral breast where liposuction was performed.  Chaperone present on exam Bilateral NAC's are viable.  Bilateral breast dressings are in place, incisions overall appear to be intact. No ecchymosis noted.  Bilateral breasts are symmetric.  There is no erythema or cellulitic changes noted. No obvious subcutaneous fluid collections noted with palpation.   A/P:  Recommend continuing with compressive garment 24/7 until 6 weeks post-op,  avoiding strenuous activity/heavy lifting until 6 weeks post-op  Recommend following up in 2 weeks  She would like to return to work tomorrow, I think this is reasonable as long as she is continuing to avoid heavy lifting greater than 20 pounds.  All of the patient's questions were answered to their content. Recommend calling with any questions or concerns.  We will plan to take pictures at her next appointment.

## 2023-12-05 ENCOUNTER — Ambulatory Visit: Payer: PRIVATE HEALTH INSURANCE | Admitting: Surgical

## 2023-12-05 ENCOUNTER — Encounter: Payer: Self-pay | Admitting: Surgical

## 2023-12-05 DIAGNOSIS — G8929 Other chronic pain: Secondary | ICD-10-CM

## 2023-12-05 DIAGNOSIS — N62 Hypertrophy of breast: Secondary | ICD-10-CM

## 2023-12-05 DIAGNOSIS — M546 Pain in thoracic spine: Secondary | ICD-10-CM

## 2023-12-05 DIAGNOSIS — E890 Postprocedural hypothyroidism: Secondary | ICD-10-CM

## 2023-12-08 ENCOUNTER — Telehealth: Payer: Self-pay

## 2023-12-08 NOTE — Telephone Encounter (Signed)
 We have gotten paperwork to clear pt for surgery. However, pt has not been seen by PCP since 04/28/2023 and provider has stated pt is needing an office visit with him in order to be cleared.  Please schedule pt upon her call back as I was unsuccessful with this call.

## 2023-12-15 NOTE — Telephone Encounter (Signed)
 Called and left detailed message for patient to reach out to office to schedule follow up with PCP. Last seen PCP in December of 2023.

## 2023-12-19 ENCOUNTER — Encounter: Payer: Self-pay | Admitting: Plastic Surgery

## 2023-12-19 ENCOUNTER — Ambulatory Visit (INDEPENDENT_AMBULATORY_CARE_PROVIDER_SITE_OTHER): Payer: PRIVATE HEALTH INSURANCE | Admitting: Plastic Surgery

## 2023-12-19 VITALS — BP 137/86 | HR 72 | Ht 62.0 in | Wt 163.2 lb

## 2023-12-19 DIAGNOSIS — N62 Hypertrophy of breast: Secondary | ICD-10-CM

## 2023-12-19 NOTE — Progress Notes (Signed)
 The patient is a 58 year old female here for follow-up after undergoing bilateral breast reduction.  There is no sign of infection hematoma or seroma.  Overall she is doing really well she has a little bit of bruising which is to be expected.  She has a little swelling and this will get better over the next few days.  I went ahead and removed the dressing and put fresh Steri-Strips on we will plan to see her back in 1 and half weeks.  Hold off on heavy lifting for another 2 weeks.

## 2024-01-04 ENCOUNTER — Ambulatory Visit: Payer: PRIVATE HEALTH INSURANCE | Admitting: Surgical

## 2024-01-04 VITALS — BP 115/72 | HR 78

## 2024-01-04 DIAGNOSIS — N62 Hypertrophy of breast: Secondary | ICD-10-CM

## 2024-01-04 DIAGNOSIS — E890 Postprocedural hypothyroidism: Secondary | ICD-10-CM

## 2024-01-04 DIAGNOSIS — G8929 Other chronic pain: Secondary | ICD-10-CM

## 2024-01-04 DIAGNOSIS — M546 Pain in thoracic spine: Secondary | ICD-10-CM

## 2024-01-04 NOTE — Progress Notes (Signed)
 58 year old female here for follow-up of her breast reduction with Dr. Ulice Bold on 11/27/2023.  She is 5 and half weeks postop.  Patient reports she is doing really well.  She does have some questions about abnormal shape/fold of the left lateral breast and right lateral breast.  She reports the left side is worse.  She has questions about if this will resolve on its own or if something will need to be done in the future.  Chaperone present on exam Bilateral NAC's are viable, bilateral breast incisions are intact. There is no erythema or cellulitic changes noted. No obvious subcutaneous fluid collections noted with palpation.  There is a fold of the left lateral and right lateral breast incision that is causing some excess skin to protrude like a dogear.  A/P:  Recommend continuing with compressive garment 24/7 until 6 weeks post-op,  avoiding strenuous activity/heavy lifting until 6 weeks post-op  Recommend following up as needed.  We did discuss that if she does not notice improvement of the lateral dogears that we should see her back in about 6 months for reevaluation and can possibly excise these in the office.  Patient was agreeable with this.  Pictures were taken at her last appointment.  All of the patient's questions were answered to their content. Recommend calling with any questions or concerns.  Recommend scar creams or silicone scar strips.

## 2024-01-05 ENCOUNTER — Encounter: Payer: PRIVATE HEALTH INSURANCE | Admitting: Surgical

## 2024-01-15 NOTE — Progress Notes (Signed)
 Patient Name: [name]  MR#: [id number] 58 y.o. female   01/15/2024  PCP:  Marolyn LULLA Noel, MD  Referring Provider:  Marolyn LULLA Noel, MD  Chief Complaint:  Chief Complaint  Patient presents with  . Follow-up    FU to results of Zio done 09/25/23.  Patient feels PVC's most of the time.  No cardiac complaints today    Assessment & Plan PVC (premature ventricular contraction) Doing well, but Monitor showed PVC burden ~ 6.3%  Only taking Ranolazine 1 time per day.  INCREASE up to 2x per day  NO SX Coronary artery disease involving native coronary artery of native heart without angina pectoris Angina:  NO ssCP, anginal equivalent, or inordinate dyspnea    Assessment & Plan Summary: Stable from an EP Standpoint - see above      RHYTHM:  NO significant symptoms of palpitations, pre-syncope, syncope or weakness.       No Clinically Significant, Sustained Arrhythmias  No Clinical Change in General Cardiac Status (No ssCP or HF Symptoms) Return to Clinic with [facility] after the echo in 4-5 months. RTC with EP in ~ 1 year.  Perhaps we can let her stick with Gen. Card if Monitor is okay next year Echo prior to f/u with [facility] b/c of the PVCs and CAD Hx. Summary of Orders Placed:   Orders Placed This Encounter  Procedures  . 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 Hx of bradycardia and hypotension on Atenolol  - improved Sx after it was stopped.  History includes a history of PVCs, known CAD with stent to the LAD, hypertension, statin intolerance being followed by Dr. Launie who is managing the situation that is somewhat frustrating because she is allergic to Repatha.  She was seen by Dr. Meldon in December who ordered a follow-up Zio patch monitor that actually showed fairly balanced PVC burden but seems to be less symptomatic.  PVC burden around 6.3% which is not ideal but not high enough to justify  ablation.  Interestingly, she was only taking the ranolazine 1 time per day.  She is going to increase that to twice daily.  She denies any anginal   chest discomfort.  No heart failure symptoms such as orthopnea PND or dependent edema.  She states that she has been able to lose some weight and is feeling better.    Review of Systems  Pertinent items are noted in HPI.     Objective:   See Scanned data/ .pdf for detail regarding tests and studies     Vital Signs  BP 107/65 (BP Location: Right arm, Patient Position: Sitting)   Pulse 67   Ht 1.575 m (5' 2)   Wt 71.7 kg (158 lb)   SpO2 96%   BMI 28.90 kg/m  Body mass index is 28.9 kg/m.  Wt Readings from Last 3 Encounters:  01/15/24 71.7 kg (158 lb)  09/15/23 74.5 kg (164 lb 4.8 oz)  09/09/22 78.2 kg (172 lb 8 oz)    Physical Exam   Physical Exam:   (Vitals above) Largely Unremarkable/unchanged Physical Exam  General: NAD, appropriate affect -  well appearing. Well kempt Heart: RRR Normal S1, S2  no murmur.  NO gallop, or rub Lungs: CTA Abdomen: Benign            Extremities: No significant edema and no clubbing, cyanosis  Skin: Pink, warm, and dry Neurologic: A&Ox4,  No  focal or lateralizing deficits.     Lab Review   HX SODIUM  Date/Time Value Ref Range Status  07/07/2022 01:20 PM 138 135 - 146 MMOL/L Final   HX POTASSIUM  Date/Time Value Ref Range Status  07/07/2022 01:20 PM 4.4 3.5 - 5.3 MMOL/L Final    Comment:    MODERATE HEMOLYSIS   HX MAGNESIUM  Date/Time Value Ref Range Status  07/07/2022 01:20 PM 2.5 (H) 1.8 - 2.4 MG/DL Final    Comment:    Patients taking eltrombopag at doses >/= 100 mg daily may show falsely elevated values of 10% or greater.   HX BUN  Date/Time Value Ref Range Status  07/07/2022 01:20 PM 10 8 - 24 MG/DL Final   HX CREATININE  Date/Time Value Ref Range Status  07/07/2022 01:20 PM 0.92 0.50 - 1.50 MG/DL Final   HX CHOLESTEROL  Date/Time Value Ref Range Status   10/04/2021 02:37 PM 223 (H) 25 - 199 MG/DL Final   HX TRIGLYCERIDES  Date/Time Value Ref Range Status  10/04/2021 02:37 PM 134 10 - 150 MG/DL Final   HX HDL CHOLESTEROL  Date/Time Value Ref Range Status  10/04/2021 02:37 PM 59 35 - 135 MG/DL Final   HX HGB  Date/Time Value Ref Range Status  07/07/2022 01:20 PM 15.2 12.3 - 15.3 G/DL Final   HX HCT  Date/Time Value Ref Range Status  07/07/2022 01:20 PM 45.3 (H) 35.9 - 44.6 % Final   HX PLT  Date/Time Value Ref Range Status  07/07/2022 01:20 PM 293 150 - 450 X 10*3/uL Final     The 10-year ASCVD risk score (Arnett DK, et al., 2019) is: 3.3%   Values used to calculate the score:     Age: 60 years     Sex: Female     Is Non-Hispanic African American: Yes     Diabetic: No     Tobacco smoker: No     Systolic Blood Pressure: 107 mmHg     Is BP treated: Yes     HDL Cholesterol: 59 MG/DL     Total Cholesterol: 223 MG/DL  Medical History  Patient Active Problem List  Diagnosis  . CAD (coronary artery disease)  . Essential hypertension  . Other hyperlipidemia  . Palpitations  . PVC (premature ventricular contraction)  . Dysphagia  . Gastroesophageal reflux disease  . H/O acute pancreatitis  . Other abnormal glucose  . Hypothyroidism  . Incomplete bladder emptying  . Pain in limb  . Occipital neuralgia of left side  . Nontoxic uninodular goiter  . Bladder pain  . Dyspnea on exertion  . Arthritis of first metatarsophalangeal (MTP) joint of left foot  . Sprain of right ankle  . Synovitis of left foot  . Arthralgia  . Lumbago  . Right-sided chest wall pain  . Unstable angina (HCC)  . Upper respiratory infection  . Headache  . Statin intolerance  . Bradycardia, sinus  . Hypercholesterolemia    Social History  Social History   Tobacco Use  Smoking Status Never  Smokeless Tobacco Never    Medications  Current Outpatient Medications  Medication Sig Dispense Refill  . acetaminophen  (TYLENOL ) 500 mg  tablet Take 1,000 mg by mouth.    . amLODIPine  (NORVASC ) 10 mg tablet Take 1 tablet (10 mg total) by mouth nightly. 90 tablet 3  . aspirin  81 mg EC tablet Take 81 mg by mouth.    . cholecalciferol  (VITAMIN D3) 2,000 unit tablet Take 2,000 Units by mouth.    SABRA  naproxen  (NAPROSYN ) 500 mg tablet Take 1 tablet (500 mg total) by mouth 2 (two)  times daily as needed for moderate pain 30 tablet 1  . nitroglycerin  (NITROSTAT ) 0.4 mg SL tablet Place 0.4 mg under the tongue.    . ranolazine (RANEXA) 500 mg 12 hr tablet Take 1 tablet (500 mg total) by mouth 2 times daily. 180 tablet 3   No current facility-administered medications for this visit.    Allergies  Allergies  Allergen Reactions  . Atorvastatin Itching    Muscle aches and pains  . Lisinopril  Cough  . Rosuvastatin  Calcium  Other (See Comments)    Muscle aches and pains  . Pitavastatin  Myalgias    achy  . Evolocumab Rash and Itching    Back pain   No past surgical history on file. The above discussed in length with patient, all questions answered. If any changes/questions/concerns patient will call.  I have personally spent >30 minutes involved in face-to-face and non-face-to-face activities for this patient on the day of the visit.  Professional time spent includes chart review and communication with the team involved in the patient's care, in addition to those noted in the documentation. Morene Cyrus Phoenix, PA-C PA-C

## 2024-05-06 NOTE — Progress Notes (Addendum)
 [facility] Cardiology Office Visit   Patient Name: [name] DOB: [birth year]  MRN: [id number] Visit Date: 05/06/2024  Primary Cardiologist and APP: Wadie Counter, M.D. (Gen) and Hildegard Rinks, M.D. (EP) Primary Care Provider: Marolyn LULLA Noel, MD   ASSESSMENT/PLAN    1. Coronary artery disease involving native coronary artery of native heart without angina pectoris (Primary) 2. Hypercholesterolemia 3. Statin intolerance 05/06/2024  - Injection site reaction to Repatha, previously deferred Praluent  - She is requesting to rechecking cholesterol today after dietary modification - - - Lipid Panel; Future  4. Essential (primary) hypertension 5. Leg swelling 05/06/2024 -BP well-controlled with amlodipine  10 mg -She reports 1 episode of lower leg swelling that resolved spontaneously along with some daily sock indention    -Recent echo on 04/27/2024 reassuring for normal EF -Discussed trial off amlodipine  and replacing it with losartan  but patient deferred -Encouraged her to monitor signs and symptoms, if this problem worsens and persists, she will let me know and we will trial off amlodipine  - - - CBC without Differential; Future - Comprehensive Metabolic Panel; Future  6. PVC (premature ventricular contraction) 05/06/2024 - Continue care with EP cardiology - One recent flare of PVCs on 04/18/24, otherwise stable  - Will recheck TSH due to history of remote thyroid  nodule/radiation - - - TSH With Reflex To Free T4; Future      Will schedule 1 year follow-up with Dr. Counter, consider sooner if cholesterol returns uncontrolled     HPI   05/06/2024    Chief Complaint:  Chief Complaint  Patient presents with  . Follow-up    4 month     Reason for Visit:   Medical management of CAD  Patient was last seen in office on 01/15/2024 by Odis Phoenix, PA-C for PVCs  She was previously seen by Dr. Counter in 2023 in lipid  clinic.  She reports multiple statin intolerances with myalgias and she was tried on Repatha but had a injection site reaction.  At the time, she was advised to try Praluent but deferred.  She reports changing her diet, limiting meat intake to twice weekly.  She would like to have her cholesterol rechecked as it has been sometime.  Since her last appointment with Odis, she had experienced bilateral lower leg swelling, R>L on 04/18/2024 that resolved the following day.  She denies significant physical activity or dietary changes around this time.  She also reports having a flareup of her PVCs accompanied by shortness of breath.  She has not had any similar episodes since.  She reports monitoring her blood pressure at home which averages around 117/77 consistently. She endorses trial of lisinopril  in the past which caused a dry cough and this medication was discontinued.  She is currently on amlodipine  10 mg, aspirin  81 mg, ranolazine 500 mg twice daily, and nitro SL 0.4 mg as needed (has not taken it in a long time)  She reports remote history of thyroid  nodule requiring iodine radiation treatment and followed with endocrinologist.  She reports her thyroid  function returned to normal after treatment and she no longer follows with endocrinology.  She follows with a PCP at [facility].  She denies chest pain or pressure, dizziness, lightheadedness.  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   Cardiovascular:  Positive for leg swelling. Negative for chest pain and palpitations.  Gastrointestinal:  Negative for nausea and vomiting.  Neurological:  Negative for dizziness, light-headedness and headaches.     EXAMINATION   Vitals:   05/06/24 1401  BP: 124/81  BP Location: Left arm  Patient Position: Sitting  Pulse: 70  SpO2: 98%  Weight: 70.8 kg  (156 lb)    Body mass index is 28.53 kg/m.   Wt Readings from Last 3 Encounters:  05/06/24 70.8 kg (156 lb)  01/15/24 71.7 kg (158 lb)  09/15/23 74.5 kg (164 lb 4.8 oz)     Physical Exam Constitutional:      General: She is not in acute distress.    Appearance: Normal appearance.  HENT:     Head: Normocephalic.     Right Ear: External ear normal.     Left Ear: External ear normal.   Eyes:     Extraocular Movements: Extraocular movements intact.     Conjunctiva/sclera: Conjunctivae normal.    Cardiovascular:     Rate and Rhythm: Normal rate and regular rhythm.     Heart sounds: Normal heart sounds.  Pulmonary:     Effort: Pulmonary effort is normal.     Breath sounds: Normal breath sounds.   Musculoskeletal:     Right lower leg: 1+ Edema present.     Left lower leg: 1+ Edema present.   Skin:    General: Skin is warm and dry.   Neurological:     Mental Status: She is alert.   Psychiatric:        Mood and Affect: Mood normal.        Behavior: Behavior normal.      EKG:  09/15/2023 NSR with PVCs, rate 64 bpm  Last ECHO:  04/27/2024 Normal LV size, wall thickness, wall motion and systolic function with  ejection fraction 55-60%  The right ventricle is normal in size and function.  The aortic sinus is normal size.  IVC size was normal.  There is no pericardial effusion.  There is no significant valvular stenosis or regurgitation.  There is no comparison study available.   NM treadmill exercise stress Test:  06/16/2021 IMPRESSION  1.  Good quality study.  2.  Normal EKG and hemodynamic response to exercise stress.  Fair exercise   Last Cardiac Cath: 06/17/2022 [facility]) - LM: Normal  - LAD: 20% prox, 20% mid disease, partly ISR  - Lcx: Mild diffuse disease  - RCA: Mild diffuse disease   Summary/recommendations: - No significant epicardial artery stenoses  - Suspect nitrate responsive microvascular angina  - Recommend addition of PO Imdur  60 mg  daily  - Recommend Aspirin  81 mg daily, lipid reduction.  - Patient has been intolerant to statins and Repatha. Consider Bempedoic  Acid. Defer to primary cardiologist.  - Okay to discharge home this afternoon  - Follow up with primary cardiologist in [place]  --------------------------------------------------------------------------------------------------------------------- Allergies Allergies[1]  Medications Current Medications[2]  Medical history Medical History[3]  Surgical History Surgical History[4]  Social History Tobacco Use History[5]  Family History Family History[6]    Provider: Calton Charlies Crumble, PA-C       [1] Allergies Allergen Reactions  . Atorvastatin Itching    Muscle aches and pains  . Lisinopril  Cough  . Rosuvastatin  Calcium  Other (See Comments)    Muscle aches and pains  . Pitavastatin  Myalgias    achy  . Evolocumab Rash and Itching    Back pain  [2] Current Outpatient  Medications  Medication Sig Dispense Refill  . acetaminophen  (TYLENOL ) 500 mg tablet Take 1,000 mg by mouth.    . amLODIPine  (NORVASC ) 10 mg tablet Take 1 tablet (10 mg total) by mouth nightly. 90 tablet 3  . aspirin  81 mg EC tablet Take 81 mg by mouth.    . cholecalciferol  (VITAMIN D3) 2,000 unit tablet Take 2,000 Units by mouth.    . naproxen  (NAPROSYN ) 500 mg tablet Take 1 tablet (500 mg total) by mouth 2 (two)  times daily as needed for moderate pain 30 tablet 1  . nitroglycerin  (NITROSTAT ) 0.4 mg SL tablet Place 0.4 mg under the tongue.    . ranolazine (RANEXA) 500 mg 12 hr tablet Take 1 tablet (500 mg total) by mouth 2 times daily. 180 tablet 3   No current facility-administered medications for this visit.  [3] No past medical history on file. [4] No past surgical history on file. [5] Social History Tobacco Use  Smoking Status Never  Smokeless Tobacco Never  [6] Family History Problem Relation Name Age of Onset  . Ovarian cancer Sister    . Thyroid   disease Neg Hx    . Breast cancer Neg Hx

## 2024-05-16 NOTE — Progress Notes (Signed)
 Unnecessary visit. Just seen by other clinician

## 2024-06-13 ENCOUNTER — Ambulatory Visit: Payer: Self-pay

## 2024-06-13 ENCOUNTER — Ambulatory Visit
Admission: RE | Admit: 2024-06-13 | Discharge: 2024-06-13 | Disposition: A | Discharge status: Home / Self Care | Payer: PRIVATE HEALTH INSURANCE | Source: Ambulatory Visit | Attending: Nurse Practitioner | Admitting: Nurse Practitioner

## 2024-06-13 ENCOUNTER — Ambulatory Visit (INDEPENDENT_AMBULATORY_CARE_PROVIDER_SITE_OTHER): Payer: PRIVATE HEALTH INSURANCE | Admitting: Radiology

## 2024-06-13 VITALS — BP 135/90 | HR 75 | Temp 98.5°F | Resp 17

## 2024-06-13 DIAGNOSIS — R0602 Shortness of breath: Secondary | ICD-10-CM

## 2024-06-13 DIAGNOSIS — R051 Acute cough: Secondary | ICD-10-CM | POA: Diagnosis not present

## 2024-06-13 DIAGNOSIS — U071 COVID-19: Secondary | ICD-10-CM

## 2024-06-13 DIAGNOSIS — R9431 Abnormal electrocardiogram [ECG] [EKG]: Secondary | ICD-10-CM

## 2024-06-13 DIAGNOSIS — R002 Palpitations: Secondary | ICD-10-CM

## 2024-06-13 MED ORDER — ALBUTEROL SULFATE HFA 108 (90 BASE) MCG/ACT IN AERS: 2.0000 | INHALATION_SPRAY | RESPIRATORY_TRACT | 0 refills | Status: AC | PRN

## 2024-06-13 MED ORDER — IPRATROPIUM-ALBUTEROL 0.5-2.5 (3) MG/3ML IN SOLN
3.0000 mL | Freq: Once | RESPIRATORY_TRACT | Status: AC
  Administered 2024-06-13: 3 mL via RESPIRATORY_TRACT

## 2024-06-13 MED ORDER — PREDNISONE 20 MG PO TABS
40.0000 mg | ORAL_TABLET | Freq: Every day | ORAL | 0 refills | Status: AC
Start: 2024-06-13 — End: 2024-06-18

## 2024-06-13 NOTE — Telephone Encounter (Signed)
 FYI Only or Action Required?: FYI only for provider.  Patient was last seen in primary care on 04/28/2023 by Norleen Lynwood ORN, MD.  Called Nurse Triage reporting Covid Positive.  Symptoms began a week ago.  Interventions attempted: Rest, hydration, or home remedies.  Symptoms are: gradually worsening.  Triage Disposition: See HCP Within 4 Hours (Or PCP Triage)  Patient/caregiver understands and will follow disposition?: Yes          Copied from CRM #[id number]. Topic: Clinical - Red Word Triage >> Jun 13, 2024  9:20 AM Franky GRADE wrote: Red Word that prompted transfer to Nurse Triage: Patient tested for Covid on Saturday 06/08/2024, symptoms have worsen to the point where it is difficult for patient to catch her breath and tightness in the chest. Reason for Disposition  MILD difficulty breathing (e.g., minimal/no SOB at rest, SOB with walking, pulse < 100)  Answer Assessment - Initial Assessment Questions 1. SYMPTOMS: What is your main symptom or concern? (e.g., cough, fever, shortness of breath, muscle aches)     cough 2. ONSET: When did the symptoms start?      Friday 3. COUGH: Do you have a cough? If Yes, ask: How bad is the cough?       coughing 4. FEVER: Do you have a fever? If Yes, ask: What is your temperature, how was it measured, and when did it start?     Most recent temp 99 yesterday, tmax 102 5. BREATHING DIFFICULTY: Are you having any difficulty breathing? (e.g., normal; shortness of breath, wheezing, unable to speak)      I can't catch my breath when I cough 6. BETTER-SAME-WORSE: Are you getting better, staying the same or getting worse compared to yesterday?  If getting worse, ask, In what way?     worse 7. OTHER SYMPTOMS: Do you have any other symptoms?  (e.g., chills, fatigue, headache, loss of smell or taste, muscle pain, sore throat)     Congestion, sore throat, fever, aches, chest tightness with coughing 8. COVID-19 DIAGNOSIS: How do you  know that you have COVID? (e.g., positive lab test or self-test, diagnosed by doctor or NP/PA, symptoms after exposure).     Home test 9. COVID-19 EXPOSURE: Was there any known exposure to COVID before the symptoms began?      unknown 10. COVID-19 VACCINE: Have you had the COVID-19 vaccine? If Yes, ask: When did you last get it?       N/a 11. HIGH RISK DISEASE: Do you have any chronic medical problems? (e.g., asthma, heart or lung disease, weak immune system, obesity, etc.)       N/a 12. PREGNANCY: Is there any chance you are pregnant? When was your last menstrual period?       N/a 13. O2 SATURATION MONITOR:  Do you use an oxygen saturation monitor (pulse oximeter) at home? If Yes, ask What is your reading (oxygen level) today? What is your usual oxygen saturation reading? (e.g., 95%)       N/a  Protocols used: COVID-19 - Diagnosed or Suspected-A-AH

## 2024-06-13 NOTE — Discharge Instructions (Addendum)
 You were seen today for shortness of breath and palpitations in the setting of a recent COVID-19 infection. Your evaluation included an exam, EKG, and chest X-ray. The EKG showed some changes compared to your previous study, but there was no sign of a major heart attack. Your chest X-ray was normal. You received a breathing treatment in the clinic, which improved your symptoms, and your heart rhythm was regular at the time of discharge.  Because of your heart history and the changes seen on your EKG, it is important that you contact your cardiologist today to inform them of your visit and request follow-up for further guidance. You were prescribed a short course of steroids and an albuterol  inhaler to help with your breathing. Continue supportive care at home, including plenty of rest, staying hydrated, and using over-the-counter medicines for fever, congestion, or cough as needed.  COVID-19 generally appears to be less severe now compared to earlier stages of the pandemic back in 2020. This is likely due to a combination of factors, including increased population immunity from vaccination and prior infections, as well as the evolution of the virus towards less virulent strains. While new variants continue to emerge, they generally cause milder illness, especially in individuals with prior immunity.  Because of this, the CDC has updated its isolation guidance for COVID-19 and states that a 5-day isolation period following a positive test result is no longer needed. Under the new guidelines, people will not need to isolate if they are fever-free for at least 24 hours without medication and if their symptoms are mild and improving. You may return to normal activities if your symptoms are overall improving and you have been without a fever for 24 hours without taking fever reducing medications like Tylenol  or ibuprofen. Monitor your symptoms closely over the next several days. If your symptoms are not improving  within a week, follow up with your primary care provider.   Seek emergency care right away if you develop sudden or worsening chest pain, increasing shortness of breath, fainting, dizziness, a racing or irregular heartbeat that does not go away, or any other new or severe symptoms.

## 2024-06-13 NOTE — ED Provider Notes (Signed)
 [facility]    CSN: [id number] Arrival date & time: 06/13/24  1018      History   Chief Complaint Chief Complaint  Patient presents with   Cough    COVID + - Entered by patient   Palpitations   Chest Pain    HPI [name] is a 58 y.o. female.   The patient, with a history of premature ventricular contractions with a burden of 6.3% managed on ranolazine twice daily, coronary artery disease with prior stent placement to the LAD, hypertension, and statin intolerance, presents with new-onset shortness of breath and palpitations that began today.  She reports that 6 days ago she developed fevers, chills, nasal congestion, postnasal drainage, rhinorrhea, sneezing, cough, nausea, dizziness, and generalized weakness. The following day she tested positive for COVID-19 on a home test. She has since been managing her symptoms supportively with Coricidin HBP. She endorses decreased appetite but states she has been drinking fluids fairly well.  Today, she developed shortness of breath, which occurs occasionally at rest but is more pronounced with exertion. She also experienced palpitations earlier in the day, though none are present at this time. The dizziness has worsened and occurs mainly when standing and during coughing episodes. She describes chest tightness but denies wheezing, orthopnea, lower extremity swelling, diarrhea, vomiting, sore throat, or paresthesias in the lower extremities.  The following sections of the patient's history were reviewed and updated as appropriate: allergies, current medications, past family history, past medical history, past social history, past surgical history, and problem list.            Past Medical History:  Diagnosis Date   Acute pancreatitis 2011   Allergy    Anemia    CAD (coronary artery disease) 2009   Dr Mitzi -[facility], Heart Cath   Family history of adverse reaction to anesthesia    mother and brother have  difficulty waking up out from it   Family history of breast cancer    Family history of colon cancer    Family history of kidney cancer    Family history of ovarian cancer    Family history of prostate cancer    GERD (gastroesophageal reflux disease)    Hyperlipidemia    Hypertension    Hypothyroidism    recently dx'd (12/07/2015)   PVC (premature ventricular contraction)    Statin intolerance    STD (sexually transmitted disease)    CHL 20 yrs ago   Thyroid  nodule 2010   precancerous; thyroid  treated with radiation   Tubular adenoma of colon 06/2015    Patient Active Problem List   Diagnosis Date Noted   Gait abnormality 05/03/2023   Left leg weakness 05/03/2023   Left leg paresthesias 04/28/2023   Symptomatic mammary hypertrophy 02/10/2023   Genetic testing 11/28/2022   Family history of breast cancer 11/17/2022   Family history of ovarian cancer 11/17/2022   Family history of prostate cancer 11/17/2022   Family history of colon cancer 11/17/2022   Family history of kidney cancer 11/17/2022   Cervical pain (neck) 09/01/2022   Large breasts 09/01/2022   Sinus bradycardia 06/17/2022   Thoracic back pain 06/16/2022   Nausea 10/06/2021   Rectal bleeding 10/06/2021   Statin intolerance 10/04/2021   Unstable angina (HCC) 07/11/2020   Synovitis of left foot 02/05/2020   Arthritis of first metatarsophalangeal (MTP) joint of left foot 01/27/2020   Sprain of right ankle 01/27/2020   Headache 12/05/2019   Arthralgia 03/19/2019   Right-sided  chest wall pain 01/11/2019   Upper respiratory infection 06/08/2018   PVC (premature ventricular contraction) 02/13/2018   Hypothyroidism 12/13/2017   Palpitations 12/28/2015   S/P primary angioplasty with coronary stent 12/07/2015   CAD (coronary artery disease), native coronary artery 12/06/2015   Non-cardiac chest pain 12/04/2015   Abdominal pain, epigastric 12/04/2015   Bladder pain 05/14/2015   Incomplete bladder emptying  05/14/2015   Occipital neuralgia of left side 11/14/2013   Aspiration into respiratory tract 04/03/2012   Cough due to bronchospasm 04/03/2012   Chest pain, atypical 04/03/2012   HYPERGLYCEMIA 06/16/2010   BACK PAIN, LUMBAR 05/27/2009   THYROID  NODULE, RIGHT 05/04/2009   Hyperlipidemia with target LDL less than 70 12/31/2008   H/O acute pancreatitis 12/31/2008   LEG PAIN, BILATERAL 12/31/2008   PARESTHESIA 12/31/2008   EDEMA 12/31/2008   History of disease 12/31/2008   RLQ abdominal pain 10/02/2007   GERD 09/17/2007   DYSPNEA 08/28/2007   Dysphagia 08/28/2007   Essential hypertension 04/24/2007    Past Surgical History:  Procedure Laterality Date   CARDIAC CATHETERIZATION     unable to do cardiac cath but does stress echo 11/22/11   CARDIAC CATHETERIZATION N/A 12/04/2015   Procedure: Left Heart Cath and Coronary Angiography;  Surgeon: Gordy Bergamo, MD;  Location[facility] INVASIVE CV LAB;  Service: Cardiovascular;  Laterality: N/A;   CARDIAC CATHETERIZATION N/A 12/04/2015   Procedure: Intravascular Pressure Wire/FFR Study;  Surgeon: Gordy Bergamo, MD;  Location: [facility] INVASIVE CV LAB;  Service: Cardiovascular;  Laterality: N/A;   CARDIAC CATHETERIZATION N/A 12/07/2015   Procedure: Coronary Stent Intervention;  Surgeon: Gordy Bergamo, MD;  Location: [facility] INVASIVE CV LAB;  Service: Cardiovascular;  Laterality: N/A;   COLONOSCOPY     CORONARY STENT PLACEMENT     EYE MUSCLE SURGERY Left 1969   LAPAROSCOPIC CHOLECYSTECTOMY  2008   LEFT HEART CATH AND CORONARY ANGIOGRAPHY N/A 11/24/2017   Procedure: LEFT HEART CATH AND CORONARY ANGIOGRAPHY;  Surgeon: Elmira Newman PARAS, MD;  Location: [facility] INVASIVE CV LAB;  Service: Cardiovascular;  Laterality: N/A;   LEFT HEART CATH AND CORONARY ANGIOGRAPHY N/A 06/17/2022   Procedure: LEFT HEART CATH AND CORONARY ANGIOGRAPHY;  Surgeon: Elmira Newman PARAS, MD;  Location: [facility] INVASIVE CV LAB;  Service: Cardiovascular;  Laterality: N/A;   POLYPECTOMY     TUBAL LIGATION Bilateral  1995   UPPER GASTROINTESTINAL ENDOSCOPY     VENTRICULAR ABLATION SURGERY  2006,2010   times 2, follow-up for v-tack    OB History     Gravida  4   Para  4   Term  4   Preterm      AB      Living  4      SAB      IAB      Ectopic      Multiple      Live Births  4            Home Medications    Prior to Admission medications   Medication Sig Start Date End Date Taking? Authorizing Provider  albuterol  (VENTOLIN  HFA) 108 (90 Base) MCG/ACT inhaler Inhale 2 puffs into the lungs every 4 (four) hours as needed for wheezing or shortness of breath (bronchospasms). 06/13/24  Yes Iola Lukes, FNP  predniSONE  (DELTASONE ) 20 MG tablet Take 2 tablets (40 mg total) by mouth daily for 5 days. 06/13/24 06/18/24 Yes Iola Lukes, FNP  amLODipine  (NORVASC ) 10 MG tablet Take 10 mg by mouth daily. 08/26/22   [provider]  nitroGLYCERIN  (NITROSTAT ) 0.4 MG SL tablet Place 0.4 mg under the tongue every 5 (five) minutes as needed for chest pain. Patient not taking: Reported on 01/04/2024 12/08/15   [provider]  ondansetron  (ZOFRAN ) 4 MG tablet Take 1 tablet (4 mg total) by mouth every 8 (eight) hours as needed for nausea or vomiting. 11/16/23   Scheeler, Donnice PARAS, PA-C  ranolazine (RANEXA) 500 MG 12 hr tablet Take 500 mg by mouth daily. 06/24/22 12/19/23  [provider]    Family History Family History  Problem Relation Age of Onset   Hypertension Mother    Diabetes Mother    Colon polyps Mother    Hypertension Father    Prostate cancer Father    Heart attack Father    Colon cancer Father 11   Ovarian cancer Sister 49   Colon polyps Sister    Diabetes Brother    Breast cancer Paternal Aunt        dx. 50s   Diabetes Maternal Grandmother    Stomach cancer Maternal Grandmother    Crohn's disease Daughter    Heart attack Cousin    Kidney cancer Cousin 37   Esophageal cancer Neg Hx    Rectal cancer Neg Hx     Social History Social  History   Tobacco Use   Smoking status: Never   Smokeless tobacco: Never  Vaping Use   Vaping status: Never Used  Substance Use Topics   Alcohol use: No   Drug use: No     Allergies   Crestor  [rosuvastatin  calcium ], Lipitor [atorvastatin], Lisinopril , Livalo  [pitavastatin ], and Repatha [evolocumab]   Review of Systems Review of Systems  Constitutional:  Positive for appetite change (decreased; drinking fairly), chills, diaphoresis, fatigue and fever (TMAX 102.4 - no fever since 09/17).  HENT:  Positive for congestion, postnasal drip, rhinorrhea and sneezing. Negative for sore throat.   Respiratory:  Positive for cough, chest tightness and shortness of breath (occasionally at rest but mainly with exertion).   Cardiovascular:  Positive for chest pain and palpitations. Negative for leg swelling.  Gastrointestinal:  Positive for nausea. Negative for diarrhea and vomiting.  Musculoskeletal:  Positive for myalgias.  Neurological:  Positive for dizziness (when standing and during coughing episodes), weakness (generalized) and headaches. Negative for numbness.  Psychiatric/Behavioral:  Positive for sleep disturbance.   All other systems reviewed and are negative.    Physical Exam Triage Vital Signs ED Triage Vitals [06/13/24 1049]  Encounter Vitals Group     BP (!) 135/90     Girls Systolic BP Percentile      Girls Diastolic BP Percentile      Boys Systolic BP Percentile      Boys Diastolic BP Percentile      Pulse Rate 75     Resp 17     Temp 98.5 F (36.9 C)     Temp Source Oral     SpO2 96 %     Weight      Height      Head Circumference      Peak Flow      Pain Score      Pain Loc      Pain Education      Exclude from Growth Chart    No data found.  Updated Vital Signs BP (!) 135/90 (BP Location: Right Arm)   Pulse 75   Temp 98.5 F (36.9 C) (Oral)   Resp 17   LMP 11/17/2017   SpO2  96%   Visual Acuity Right Eye Distance:   Left Eye Distance:    Bilateral Distance:    Right Eye Near:   Left Eye Near:    Bilateral Near:     Physical Exam Vitals reviewed.  Constitutional:      General: She is awake. She is not in acute distress.    Appearance: Normal appearance. She is well-developed. She is not ill-appearing, toxic-appearing or diaphoretic.  HENT:     Head: Normocephalic.     Right Ear: Tympanic membrane, ear canal and external ear normal. No drainage, swelling or tenderness. No middle ear effusion. Tympanic membrane is not erythematous.     Left Ear: Tympanic membrane, ear canal and external ear normal. No drainage, swelling or tenderness.  No middle ear effusion. Tympanic membrane is not erythematous.     Nose: Congestion present. No rhinorrhea.     Mouth/Throat:     Lips: Pink.     Mouth: Mucous membranes are moist.     Pharynx: Oropharynx is clear. Uvula midline. No pharyngeal swelling, oropharyngeal exudate, posterior oropharyngeal erythema or uvula swelling.     Tonsils: No tonsillar exudate or tonsillar abscesses.  Eyes:     General: Vision grossly intact.     Conjunctiva/sclera: Conjunctivae normal.  Cardiovascular:     Rate and Rhythm: Normal rate and regular rhythm.     Pulses: Normal pulses.     Heart sounds: Normal heart sounds.  Pulmonary:     Effort: Pulmonary effort is normal. No tachypnea or respiratory distress.     Breath sounds: Normal breath sounds and air entry.     Comments: Respirations even and unlabored  Abdominal:     Palpations: Abdomen is soft.  Musculoskeletal:        General: Normal range of motion.     Cervical back: Normal range of motion and neck supple.     Right lower leg: No edema.     Left lower leg: No edema.  Lymphadenopathy:     Cervical: No cervical adenopathy.  Skin:    General: Skin is warm and dry.  Neurological:     General: No focal deficit present.     Mental Status: She is alert and oriented to person, place, and time.     Sensory: Sensation is intact.     Motor:  Motor function is intact.     Coordination: Coordination is intact.     Gait: Gait is intact.  Psychiatric:        Speech: Speech normal.        Behavior: Behavior is cooperative.      UC Treatments / Results  Labs (all labs ordered are listed, but only abnormal results are displayed) Labs Reviewed - No data to display  EKG   Radiology DG Chest 2 View Result Date: 06/13/2024 CLINICAL DATA:  Cough, shortness of breath. EXAM: CHEST - 2 VIEW COMPARISON:  July 07, 2022. FINDINGS: The heart size and mediastinal contours are within normal limits. Both lungs are clear. The visualized skeletal structures are unremarkable. IMPRESSION: No active cardiopulmonary disease. Electronically Signed   By: Lynwood Landy Raddle M.D.   On: 06/13/2024 12:10    Procedures ED EKG  Date/Time: 06/13/2024 12:02 PM  Performed by: Iola Lukes, FNP Authorized by: Iola Lukes, FNP   Previous ECG:    Previous ECG:  Compared to current (04/28/2023)   Similarity:  Changes noted Interpretation:    Details:  EKG from today could be revolving ischemia or  nonspecific findings Rate:    ECG rate:  71   ECG rate assessment: normal   Rhythm:    Rhythm: sinus rhythm   Ectopy:    Ectopy: PAC   QRS:    QRS axis:  Normal   QRS intervals:  Normal   QRS conduction: normal   ST segments:    ST segments:  Non-specific T waves:    T waves: flattening and inverted     Flattening:  V5 and V6   Inverted:  II, III and aVF  (including critical care time)  Note about EKG: Prior EKG showed normal sinus rhythm with nonspecific ST-T wave abnormalities, most notable in leads I, aVL, and V5-V6. The current EKG shows sinus rhythm with occasional PACs. ST-T wave abnormalities are now noted more so within the inferior leads II, III, and aVF.  No acute ST-segment elevation noted in either studies.   Medications Ordered in UC Medications  ipratropium-albuterol  (DUONEB) 0.5-2.5 (3) MG/3ML nebulizer solution 3 mL (3  mLs Nebulization Given 06/13/24 1142)    Initial Impression / Assessment and Plan / UC Course  I have reviewed the triage vital signs and the nursing notes.  Pertinent labs & imaging results that were available during my care of the patient were reviewed by me and considered in my medical decision making (see chart for details).     The patient with a history of PVCs on ranolazine, CAD with prior LAD stent, hypertension, and statin intolerance presents with new shortness of breath and palpitations in the setting of recent COVID-19 infection. She reports several days of viral symptoms including fever, congestion, cough, dizziness, and generalized weakness, with worsening shortness of breath and chest tightness today. On exam she is alert, afebrile, nontoxic, hemodynamically stable, and in no acute distress with clear lungs and regular heart rhythm. EKG shows sinus rhythm with occasional PACs, new ST-T wave abnormalities now more prominent in the inferior leads compared to prior study, without acute ST elevation or depression. Chest X-ray is unremarkable. A duoneb treatment was administered with some improvement in breathing, and she currently denies palpitations.   Given her cardiac history, EKG changes, and concurrent COVID infection, her presentation may represent evolving ischemia versus nonspecific changes. Cardiology at [facility] was contacted for further recommendations. No response received despite leaving two urgent messages with cardiology front desk office staff requesting a return call. I then spoke with the on-call [facility] cardiologist, Dr. Lavona, who was unable to provide specific recommendations without access to today's EKG. Case was subsequently reviewed with supervising physician Dr. Vonna.   Given that the patient is clinically stable and well-appearing, the decision was made to manage symptoms with a short course of steroids, initiation of an albuterol  inhaler, and  continued supportive care. The patient was instructed to contact her cardiologist today to notify them of this visit and to request follow-up and further guidance as indicated. ED precautions were thoroughly reviewed with the patient, who voiced understanding.  Today's evaluation has revealed no signs of a dangerous process. Discussed diagnosis with patient and/or guardian. Patient and/or guardian aware of their diagnosis, possible red flag symptoms to watch out for and need for close follow up. Patient and/or guardian understands verbal and written discharge instructions. Patient and/or guardian comfortable with plan and disposition.  Patient and/or guardian has a clear mental status at this time, good insight into illness (after discussion and teaching) and has clear judgment to make decisions regarding their care  Documentation was completed with  the aid of voice recognition software. Transcription may contain typographical errors.     Final Clinical Impressions(s) / UC Diagnoses   Final diagnoses:  Acute cough  COVID-19  Palpitations  Shortness of breath  Abnormal EKG     Discharge Instructions      You were seen today for shortness of breath and palpitations in the setting of a recent COVID-19 infection. Your evaluation included an exam, EKG, and chest X-ray. The EKG showed some changes compared to your previous study, but there was no sign of a major heart attack. Your chest X-ray was normal. You received a breathing treatment in the clinic, which improved your symptoms, and your heart rhythm was regular at the time of discharge.  Because of your heart history and the changes seen on your EKG, it is important that you contact your cardiologist today to inform them of your visit and request follow-up for further guidance. You were prescribed a short course of steroids and an albuterol  inhaler to help with your breathing. Continue supportive care at home, including plenty of rest,  staying hydrated, and using over-the-counter medicines for fever, congestion, or cough as needed.  COVID-19 generally appears to be less severe now compared to earlier stages of the pandemic back in 2020. This is likely due to a combination of factors, including increased population immunity from vaccination and prior infections, as well as the evolution of the virus towards less virulent strains. While new variants continue to emerge, they generally cause milder illness, especially in individuals with prior immunity.  Because of this, the CDC has updated its isolation guidance for COVID-19 and states that a 5-day isolation period following a positive test result is no longer needed. Under the new guidelines, people will not need to isolate if they are fever-free for at least 24 hours without medication and if their symptoms are mild and improving. You may return to normal activities if your symptoms are overall improving and you have been without a fever for 24 hours without taking fever reducing medications like Tylenol  or ibuprofen. Monitor your symptoms closely over the next several days. If your symptoms are not improving within a week, follow up with your primary care provider.   Seek emergency care right away if you develop sudden or worsening chest pain, increasing shortness of breath, fainting, dizziness, a racing or irregular heartbeat that does not go away, or any other new or severe symptoms.      ED Prescriptions     Medication Sig Dispense Auth. Provider   predniSONE  (DELTASONE ) 20 MG tablet Take 2 tablets (40 mg total) by mouth daily for 5 days. 10 tablet Iola Lukes, FNP   albuterol  (VENTOLIN  HFA) 108 (90 Base) MCG/ACT inhaler Inhale 2 puffs into the lungs every 4 (four) hours as needed for wheezing or shortness of breath (bronchospasms). 18 g Iola Lukes, FNP      PDMP not reviewed this encounter.   Iola Lukes, [place] 06/13/24 [phone]

## 2024-06-13 NOTE — ED Triage Notes (Addendum)
 Pt c/o congestion, sore throat, fever, body aches, and chest tightness with coughing since Friday night She had positive at home covid test Saturday. Presents today due to cough causing her to have palpations, and sob.

## 2024-06-24 ENCOUNTER — Encounter: Payer: Self-pay | Admitting: Internal Medicine

## 2024-06-24 ENCOUNTER — Ambulatory Visit: Payer: PRIVATE HEALTH INSURANCE | Admitting: Internal Medicine

## 2024-06-24 VITALS — BP 150/100 | HR 71 | Temp 98.5°F | Ht 62.0 in | Wt 149.0 lb

## 2024-06-24 DIAGNOSIS — U071 COVID-19: Secondary | ICD-10-CM

## 2024-06-24 DIAGNOSIS — R5383 Other fatigue: Secondary | ICD-10-CM

## 2024-06-24 DIAGNOSIS — R002 Palpitations: Secondary | ICD-10-CM

## 2024-06-24 DIAGNOSIS — R0602 Shortness of breath: Secondary | ICD-10-CM

## 2024-06-24 NOTE — Patient Instructions (Signed)
 For a mild COVID-19 case - take zinc 50 mg a day for 1 week, vitamin C 1000 mg daily for 1 week, vitamin D2 50,000 units weekly for 2 months (unless  taking vitamin D daily already), an antioxidant Quercetin 500 mg twice a day for 1 week (if you can get it quick enough). Take Allegra or Benadryl.  Maintain good oral hydration and take Tylenol for high fever.

## 2024-06-24 NOTE — Progress Notes (Signed)
 Subjective:  Patient ID: [name], female    DOB: Jul 20, 1966  Age: 58 y.o. MRN: [id number]  CC: Follow-up (Light headed, dizzyness has been 3 weeks now. Difficulty breathing periodically. )   HPI LAGENA STRAND presents for COVID 3 weeks ago - SOB, PVCs, rash C/o PVCs, palpitations Cardiology appt is pending Aug 06, 2024 - monitor is pending in early October Lightheaded, SOB Pt had ECHO 2 mo ago  Outpatient Medications Prior to Visit  Medication Sig Dispense Refill   albuterol  (VENTOLIN  HFA) 108 (90 Base) MCG/ACT inhaler Inhale 2 puffs into the lungs every 4 (four) hours as needed for wheezing or shortness of breath (bronchospasms). 18 g 0   amLODipine  (NORVASC ) 10 MG tablet Take 10 mg by mouth daily.     nitroGLYCERIN  (NITROSTAT ) 0.4 MG SL tablet Place 0.4 mg under the tongue every 5 (five) minutes as needed for chest pain.     ondansetron  (ZOFRAN ) 4 MG tablet Take 1 tablet (4 mg total) by mouth every 8 (eight) hours as needed for nausea or vomiting. 20 tablet 0   ranolazine (RANEXA) 500 MG 12 hr tablet Take 500 mg by mouth daily. (Patient not taking: Reported on 06/24/2024)     No facility-administered medications prior to visit.    ROS: Review of Systems  Constitutional:  Positive for fatigue. Negative for activity change, appetite change, chills and unexpected weight change.  HENT:  Negative for congestion, mouth sores and sinus pressure.   Eyes:  Negative for visual disturbance.  Respiratory:  Negative for cough and chest tightness.   Gastrointestinal:  Negative for abdominal pain and nausea.  Genitourinary:  Negative for difficulty urinating, frequency and vaginal pain.  Musculoskeletal:  Negative for back pain and gait problem.  Skin:  Negative for pallor and rash.  Neurological:  Positive for light-headedness. Negative for dizziness, tremors, weakness, numbness and headaches.  Psychiatric/Behavioral:  Negative for confusion and sleep disturbance.      Objective:  BP (!) 150/100 (Patient Position: Standing, Cuff Size: Normal)   Pulse 71   Temp 98.5 F (36.9 C) (Oral)   Ht 5' 2 (1.575 m)   Wt 149 lb (67.6 kg)   LMP 11/17/2017   SpO2 97%   BMI 27.25 kg/m   BP Readings from Last 3 Encounters:  06/24/24 (!) 150/100  06/13/24 (!) 135/90  01/04/24 115/72    Wt Readings from Last 3 Encounters:  06/24/24 149 lb (67.6 kg)  12/19/23 163 lb 3.2 oz (74 kg)  06/28/23 160 lb 9.6 oz (72.8 kg)    Physical Exam Constitutional:      General: She is not in acute distress.    Appearance: Normal appearance. She is well-developed. She is not toxic-appearing.  HENT:     Head: Normocephalic.     Right Ear: External ear normal.     Left Ear: External ear normal.     Nose: Nose normal.  Eyes:     General:        Right eye: No discharge.        Left eye: No discharge.     Conjunctiva/sclera: Conjunctivae normal.     Pupils: Pupils are equal, round, and reactive to light.  Neck:     Thyroid : No thyromegaly.     Vascular: No JVD.     Trachea: No tracheal deviation.  Cardiovascular:     Rate and Rhythm: Normal rate and regular rhythm.     Heart sounds: Normal heart sounds.  Pulmonary:  Effort: No respiratory distress.     Breath sounds: No stridor. No wheezing.  Abdominal:     General: Bowel sounds are normal. There is no distension.     Palpations: Abdomen is soft. There is no mass.     Tenderness: There is no abdominal tenderness. There is no guarding or rebound.  Musculoskeletal:        General: No tenderness.     Cervical back: Normal range of motion and neck supple. No rigidity.  Lymphadenopathy:     Cervical: No cervical adenopathy.  Skin:    Findings: No erythema or rash.  Neurological:     Cranial Nerves: No cranial nerve deficit.     Motor: No abnormal muscle tone.     Coordination: Coordination normal.     Deep Tendon Reflexes: Reflexes normal.  Psychiatric:        Behavior: Behavior normal.        Thought  Content: Thought content normal.        Judgment: Judgment normal.    Lightheaded w/standing, but no BP drrop Lab Results  Component Value Date   WBC 5.5 06/24/2024   HGB 14.6 06/24/2024   HCT 43.3 06/24/2024   PLT 342.0 06/24/2024   GLUCOSE 79 06/24/2024   CHOL 263 (H) 09/01/2022   TRIG 88.0 09/01/2022   HDL 82.30 09/01/2022   LDLCALC 163 (H) 09/01/2022   ALT 41 (H) 06/24/2024   AST 37 06/24/2024   NA 142 06/24/2024   K 4.0 06/24/2024   CL 103 06/24/2024   CREATININE 0.75 06/24/2024   BUN 10 06/24/2024   CO2 32 06/24/2024   TSH 20.40 (H) 06/24/2024   INR 1.0 04/28/2023   HGBA1C 5.6 09/01/2022    DG Chest 2 View Result Date: 06/13/2024 CLINICAL DATA:  Cough, shortness of breath. EXAM: CHEST - 2 VIEW COMPARISON:  July 07, 2022. FINDINGS: The heart size and mediastinal contours are within normal limits. Both lungs are clear. The visualized skeletal structures are unremarkable. IMPRESSION: No active cardiopulmonary disease. Electronically Signed   By: Lynwood Landy Raddle M.D.   On: 06/13/2024 12:10    Assessment & Plan:   Problem List Items Addressed This Visit     COVID-19 - Primary   Post COVID fatigue, palpitations, lightheadedness Obtain blood work including thyroid  test Cardiology workup is in progress Go to ER if problems.  Hydrate well Take zinc, vitamin C, vitamin D .  Hydrate well      Relevant Orders   CBC with Differential/Platelet (Completed)   Comprehensive metabolic panel with GFR (Completed)   T4, free (Completed)   TSH (Completed)   Vitamin B12 (Completed)   VITAMIN D  25 Hydroxy (Vit-D Deficiency, Fractures) (Completed)   Urinalysis   DYSPNEA   Post COVID fatigue, palpitations, lightheadedness, dyspnea Obtain blood work including thyroid  test, CBC Cardiology workup is in progress Go to ER if problems.  Hydrate well      Palpitations   Post COVID fatigue, palpitations, lightheadedness, dyspnea.  History of PVCs Obtain blood work including  thyroid  test, CBC and other Cardiology workup is in progress Go to ER if problems.  Hydrate well      Relevant Orders   CBC with Differential/Platelet (Completed)   Comprehensive metabolic panel with GFR (Completed)   T4, free (Completed)   TSH (Completed)   Vitamin B12 (Completed)   VITAMIN D  25 Hydroxy (Vit-D Deficiency, Fractures) (Completed)   Urinalysis   Other Visit Diagnoses       Other fatigue  Relevant Orders   CBC with Differential/Platelet (Completed)   Comprehensive metabolic panel with GFR (Completed)   T4, free (Completed)   TSH (Completed)   Vitamin B12 (Completed)   VITAMIN D  25 Hydroxy (Vit-D Deficiency, Fractures) (Completed)   Urinalysis         No orders of the defined types were placed in this encounter.     Follow-up: No follow-ups on file.  Marolyn Noel, MD

## 2024-06-25 LAB — COMPREHENSIVE METABOLIC PANEL WITH GFR
ALT: 41 U/L — ABNORMAL HIGH (ref 0–35)
AST: 37 U/L (ref 0–37)
Albumin: 4 g/dL (ref 3.5–5.2)
Alkaline Phosphatase: 71 U/L (ref 39–117)
BUN: 10 mg/dL (ref 6–23)
CO2: 32 meq/L (ref 19–32)
Calcium: 9.8 mg/dL (ref 8.4–10.5)
Chloride: 103 meq/L (ref 96–112)
Creatinine, Ser: 0.75 mg/dL (ref 0.40–1.20)
GFR: 87.88 mL/min (ref >60.00)
Glucose, Bld: 79 mg/dL (ref 70–99)
Potassium: 4 meq/L (ref 3.5–5.1)
Sodium: 142 meq/L (ref 135–145)
Total Bilirubin: 0.8 mg/dL (ref 0.2–1.2)
Total Protein: 7.3 g/dL (ref 6.0–8.3)

## 2024-06-25 LAB — URINALYSIS, ROUTINE W REFLEX MICROSCOPIC
Bilirubin Urine: NEGATIVE
Hgb urine dipstick: NEGATIVE
Nitrite: NEGATIVE
RBC / HPF: NONE SEEN (ref 0–?)
Specific Gravity, Urine: 1.02 (ref 1.000–1.030)
Urine Glucose: NEGATIVE
Urobilinogen, UA: 2 — AB (ref 0.0–1.0)
pH: 6.5 (ref 5.0–8.0)

## 2024-06-25 LAB — CBC WITH DIFFERENTIAL/PLATELET
Basophils Absolute: 0 K/uL (ref 0.0–0.1)
Basophils Relative: 0.9 % (ref 0.0–3.0)
Eosinophils Absolute: 0.1 K/uL (ref 0.0–0.7)
Eosinophils Relative: 2.5 % (ref 0.0–5.0)
HCT: 43.3 % (ref 36.0–46.0)
Hemoglobin: 14.6 g/dL (ref 12.0–15.0)
Lymphocytes Relative: 40.7 % (ref 12.0–46.0)
Lymphs Abs: 2.2 K/uL (ref 0.7–4.0)
MCHC: 33.8 g/dL (ref 30.0–36.0)
MCV: 95 fl (ref 78.0–100.0)
Monocytes Absolute: 0.5 K/uL (ref 0.1–1.0)
Monocytes Relative: 8.6 % (ref 3.0–12.0)
Neutro Abs: 2.6 K/uL (ref 1.4–7.7)
Neutrophils Relative %: 47.3 % (ref 43.0–77.0)
Platelets: 342 K/uL (ref 150.0–400.0)
RBC: 4.55 Mil/uL (ref 3.87–5.11)
RDW: 13.3 % (ref 11.5–15.5)
WBC: 5.5 K/uL (ref 4.0–10.5)

## 2024-06-25 LAB — T4, FREE: Free T4: 0.64 ng/dL (ref 0.60–1.60)

## 2024-06-25 LAB — VITAMIN D 25 HYDROXY (VIT D DEFICIENCY, FRACTURES): VITD: 27.72 ng/mL — ABNORMAL LOW (ref 30.00–100.00)

## 2024-06-25 LAB — VITAMIN B12: Vitamin B-12: 409 pg/mL (ref 211–911)

## 2024-06-25 LAB — TSH: TSH: 20.4 u[IU]/mL — ABNORMAL HIGH (ref 0.35–5.50)

## 2024-06-26 ENCOUNTER — Ambulatory Visit: Payer: Self-pay | Admitting: Internal Medicine

## 2024-06-26 DIAGNOSIS — R002 Palpitations: Secondary | ICD-10-CM

## 2024-06-26 DIAGNOSIS — R0602 Shortness of breath: Secondary | ICD-10-CM

## 2024-06-26 DIAGNOSIS — R5383 Other fatigue: Secondary | ICD-10-CM

## 2024-06-26 DIAGNOSIS — E890 Postprocedural hypothyroidism: Secondary | ICD-10-CM

## 2024-06-30 NOTE — Assessment & Plan Note (Signed)
 Post COVID fatigue, palpitations, lightheadedness, dyspnea.  History of PVCs Obtain blood work including thyroid  test, CBC and other Cardiology workup is in progress Go to ER if problems.  Hydrate well

## 2024-06-30 NOTE — Assessment & Plan Note (Signed)
 Post COVID fatigue, palpitations, lightheadedness, dyspnea Obtain blood work including thyroid  test, CBC Cardiology workup is in progress Go to ER if problems.  Hydrate well

## 2024-06-30 NOTE — Assessment & Plan Note (Addendum)
 Post COVID fatigue, palpitations, lightheadedness Obtain blood work including thyroid  test Cardiology workup is in progress Go to ER if problems.  Hydrate well Take zinc, vitamin C, vitamin D .  Hydrate well

## 2024-08-08 ENCOUNTER — Ambulatory Visit: Payer: Self-pay

## 2024-08-08 NOTE — Telephone Encounter (Signed)
 FYI Only or Action Required?: FYI only for provider: appointment scheduled on 08/09/24.  Patient was last seen in primary care on 06/24/2024 by Plotnikov, Karlynn GAILS, MD.  Called Nurse Triage reporting Sore Throat and Fatigue.  Symptoms began several weeks ago.  Interventions attempted: Rest, hydration, or home remedies.  Symptoms are: stable.  Triage Disposition: See PCP When Office is Open (Within 3 Days)  Patient/caregiver understands and will follow disposition?: Yes Reason for Disposition  Nursing judgment or information in reference  Answer Assessment - Initial Assessment Questions States she saw a PA where she works and they stated her TSH was high and she needs to see an endocrinologist and appointment is not until January. Patient states rapid heart rate comes and goes, states she has PVCs but thinks thryoid is affecting her heart.   1. REASON FOR CALL: What is your main concern right now?     Cold sweats at night, racing heart beat, sore throat, severe fatigue, weight loss  2. ONSET: When did the symptoms start?     October, after having Covid  3. SEVERITY: How bad is the symptoms?     8/10  4. FEVER: Do you have a fever?     States they come and go, will get them for no reason, but not today  5. OTHER SYMPTOMS: Do you have any other new symptoms?     Denies  Protocols used: No Guideline Available-A-AH  Copied from CRM #[id number]. Topic: Clinical - Red Word Triage >> Aug 08, 2024  3:20 PM Suzen RAMAN wrote: Red Word that prompted transfer to Nurse Triage: Llow TSH; cold sweat, racing heart beat, sore throat, severe fatigue and weight loss. Requesting an appt.

## 2024-08-09 ENCOUNTER — Ambulatory Visit: Payer: PRIVATE HEALTH INSURANCE | Admitting: Family Medicine

## 2024-08-09 ENCOUNTER — Encounter: Payer: Self-pay | Admitting: Family Medicine

## 2024-08-09 VITALS — BP 106/68 | HR 76 | Temp 97.6°F | Ht 62.0 in | Wt 150.0 lb

## 2024-08-09 DIAGNOSIS — E890 Postprocedural hypothyroidism: Secondary | ICD-10-CM

## 2024-08-09 MED ORDER — LEVOTHYROXINE SODIUM 25 MCG PO TABS: 25.0000 ug | ORAL_TABLET | Freq: Every day | ORAL | 1 refills | Status: AC

## 2024-08-09 NOTE — Progress Notes (Signed)
 Subjective:     Patient ID: EDEE [name], female    DOB: [birth year], 58 y.o.   MRN: [id number]  Chief Complaint  Patient presents with   Thyroid  Problem    Was having heart symptoms and so saw a provider at her job in September and her thyroid  levels were high. Doesn't see endo until December and so they recommended she be seen by pcp before    Thyroid  Problem Symptoms include palpitations. Patient reports no constipation or diarrhea.    Discussed the use of AI scribe software for clinical note transcription with the patient, who gave verbal consent to proceed.  History of Present Illness [name] is a 58 year old female who presents with elevated TSH levels.  Thyroid  dysfunction - Elevated TSH level of 33 on July 25, 2024, from blood test at another facility - History of thyroid  nodule treated with radioactive iodine ablation in 2014 or 2015 - Thyroid  function normalized post-ablation; discontinued thyroid  medication in 2022 after stable levels - Currently not taking thyroid  medication - Endocrinology appointment scheduled for January  Constitutional symptoms - Weight loss - Decreased energy - Fatigue, requiring rest around 2 PM daily  Hypertension - High blood pressure managed with morning antihypertensive medication     Health Maintenance Due  Topic Date Due   Hepatitis C Screening  Never done   Hepatitis B Vaccines 19-59 Average Risk (1 of 3 - 19+ 3-dose series) Never done   Pneumococcal Vaccine: 50+ Years (2 of 2 - PPSV23, PCV20, or PCV21) 06/05/2017    Past Medical History:  Diagnosis Date   Acute pancreatitis 2011   Allergy    Anemia    CAD (coronary artery disease) 2009   Dr Mitzi -[facility], Heart Cath   Family history of adverse reaction to anesthesia    mother and brother have difficulty waking up out from it   Family history of breast cancer    Family history of colon cancer    Family history of kidney cancer    Family  history of ovarian cancer    Family history of prostate cancer    GERD (gastroesophageal reflux disease)    Hyperlipidemia    Hypertension    Hypothyroidism    recently dx'd (12/07/2015)   PVC (premature ventricular contraction)    Statin intolerance    STD (sexually transmitted disease)    CHL 20 yrs ago   Thyroid  nodule 2010   precancerous; thyroid  treated with radiation   Tubular adenoma of colon 06/2015    Past Surgical History:  Procedure Laterality Date   CARDIAC CATHETERIZATION     unable to do cardiac cath but does stress echo 11/22/11   CARDIAC CATHETERIZATION N/A 12/04/2015   Procedure: Left Heart Cath and Coronary Angiography;  Surgeon: Gordy Bergamo, MD;  Location: [facility] INVASIVE CV LAB;  Service: Cardiovascular;  Laterality: N/A;   CARDIAC CATHETERIZATION N/A 12/04/2015   Procedure: Intravascular Pressure Wire/FFR Study;  Surgeon: Gordy Bergamo, MD;  Location: [facility] INVASIVE CV LAB;  Service: Cardiovascular;  Laterality: N/A;   CARDIAC CATHETERIZATION N/A 12/07/2015   Procedure: Coronary Stent Intervention;  Surgeon: Gordy Bergamo, MD;  Location: [facility] INVASIVE CV LAB;  Service: Cardiovascular;  Laterality: N/A;   COLONOSCOPY     CORONARY STENT PLACEMENT     EYE MUSCLE SURGERY Left 1969   LAPAROSCOPIC CHOLECYSTECTOMY  2008   LEFT HEART CATH AND CORONARY ANGIOGRAPHY N/A 11/24/2017   Procedure: LEFT HEART CATH AND CORONARY ANGIOGRAPHY;  Surgeon: Elmira Penman  J, MD;  Location: [facility] INVASIVE CV LAB;  Service: Cardiovascular;  Laterality: N/A;   LEFT HEART CATH AND CORONARY ANGIOGRAPHY N/A 06/17/2022   Procedure: LEFT HEART CATH AND CORONARY ANGIOGRAPHY;  Surgeon: Elmira Newman PARAS, MD;  Location: [facility] INVASIVE CV LAB;  Service: Cardiovascular;  Laterality: N/A;   POLYPECTOMY     TUBAL LIGATION Bilateral 1995   UPPER GASTROINTESTINAL ENDOSCOPY     VENTRICULAR ABLATION SURGERY  2006,2010   times 2, follow-up for v-tack    Family History  Problem Relation Age of Onset   Hypertension  Mother    Diabetes Mother    Colon polyps Mother    Hypertension Father    Prostate cancer Father    Heart attack Father    Colon cancer Father 37   Ovarian cancer Sister 37   Colon polyps Sister    Diabetes Brother    Breast cancer Paternal Aunt        dx. 50s   Diabetes Maternal Grandmother    Stomach cancer Maternal Grandmother    Crohn's disease Daughter    Heart attack Cousin    Kidney cancer Cousin 37   Esophageal cancer Neg Hx    Rectal cancer Neg Hx     Social History   Socioeconomic History   Marital status: Married    Spouse name: lynwood   Number of children: 4   Years of education: Not on file   Highest education level: Bachelor's degree (e.g., BA, AB, BS)  Occupational History   Occupation: dentist at [employer]  Tobacco Use   Smoking status: Never   Smokeless tobacco: Never  Vaping Use   Vaping status: Never Used  Substance and Sexual Activity   Alcohol use: No   Drug use: No   Sexual activity: Yes    Partners: Male    Birth control/protection: Surgical, Post-menopausal    Comment: BTL  Other Topics Concern   Not on file  Social History Narrative   FAMILY HISTORY   History of hypertension   History of prostate cancer 1st degree relative <50   D, S ovar. [place]      Regular exercise - YES      GYN Dr Tobie - [place]   Social Drivers of Health   Financial Resource Strain: Not on file  Food Insecurity: No Food Insecurity (06/18/2022)   Hunger Vital Sign    Worried About Running Out of Food in the Last Year: Never true    Ran Out of Food in the Last Year: Never true  Transportation Needs: No Transportation Needs (06/18/2022)   PRAPARE - Administrator, Civil Service (Medical): No    Lack of Transportation (Non-Medical): No  Physical Activity: Not on file  Stress: Not on file  Social Connections: Not on file  Intimate Partner Violence: Not At Risk (06/18/2022)   Humiliation, Afraid, Rape, and  Kick questionnaire    Fear of Current or Ex-Partner: No    Emotionally Abused: No    Physically Abused: No    Sexually Abused: No    Outpatient Medications Prior to Visit  Medication Sig Dispense Refill   albuterol  (VENTOLIN  HFA) 108 (90 Base) MCG/ACT inhaler Inhale 2 puffs into the lungs every 4 (four) hours as needed for wheezing or shortness of breath (bronchospasms). 18 g 0   amLODipine  (NORVASC ) 10 MG tablet Take 10 mg by mouth daily.     nitroGLYCERIN  (NITROSTAT ) 0.4 MG SL tablet Place  0.4 mg under the tongue every 5 (five) minutes as needed for chest pain.     ondansetron  (ZOFRAN ) 4 MG tablet Take 1 tablet (4 mg total) by mouth every 8 (eight) hours as needed for nausea or vomiting. 20 tablet 0   ranolazine (RANEXA) 500 MG 12 hr tablet Take 500 mg by mouth daily. (Patient taking differently: Take 500 mg by mouth 2 (two) times daily.)     No facility-administered medications prior to visit.    Allergies  Allergen Reactions   Crestor  [Rosuvastatin  Calcium ] Other (See Comments)    Muscle aches and pains   Lipitor [Atorvastatin] Other (See Comments)    Muscle aches and pains   Lisinopril  Cough   Livalo  [Pitavastatin ] Other (See Comments)    Muscle aches and pain   Repatha [Evolocumab] Hives, Rash and Other (See Comments)    Back pain    Review of Systems  Constitutional:  Positive for malaise/fatigue. Negative for chills and fever.  Respiratory:  Negative for shortness of breath.   Cardiovascular:  Positive for palpitations. Negative for chest pain and leg swelling.  Gastrointestinal:  Negative for abdominal pain, constipation, diarrhea, nausea and vomiting.  Genitourinary:  Negative for dysuria, frequency and urgency.  Neurological:  Negative for dizziness, focal weakness and headaches.       Objective:    Physical Exam Constitutional:      General: She is not in acute distress.    Appearance: She is not ill-appearing.  Eyes:     Extraocular Movements: Extraocular  movements intact.     Conjunctiva/sclera: Conjunctivae normal.  Cardiovascular:     Rate and Rhythm: Normal rate and regular rhythm.  Pulmonary:     Effort: Pulmonary effort is normal.     Breath sounds: Normal breath sounds.  Musculoskeletal:        General: Normal range of motion.     Cervical back: Normal range of motion and neck supple.  Skin:    General: Skin is warm and dry.  Neurological:     General: No focal deficit present.     Mental Status: She is alert and oriented to person, place, and time.     Motor: No weakness.     Coordination: Coordination normal.     Gait: Gait normal.  Psychiatric:        Mood and Affect: Mood normal.        Behavior: Behavior normal.        Thought Content: Thought content normal.      BP 106/68   Pulse 76   Temp 97.6 F (36.4 C) (Temporal)   Ht 5' 2 (1.575 m)   Wt 150 lb (68 kg)   LMP 11/17/2017   SpO2 98%   BMI 27.44 kg/m  Wt Readings from Last 3 Encounters:  08/09/24 150 lb (68 kg)  06/24/24 149 lb (67.6 kg)  12/19/23 163 lb 3.2 oz (74 kg)       Assessment & Plan:   Problem List Items Addressed This Visit     Hypothyroidism - Primary   Relevant Medications   levothyroxine  (SYNTHROID ) 25 MCG tablet    Assessment and Plan Assessment & Plan Hypothyroidism after thyroid  ablation Hypothyroidism secondary to thyroid  ablation with elevated TSH at 33, indicating insufficient thyroid  hormone production. Symptoms include fatigue, weight loss, and decreased energy levels. Previous thyroid  nodule treated with ablation in 2014-2015. Currently not on medication. Cardiologist concerned about potential cardiac stress due to hypothyroidism. - Initiated low-dose thyroid  hormone replacement  therapy to avoid rapid TSH reduction and associated symptoms. - Instructed to take medication on an empty stomach, first thing in the morning, with no food or coffee for 30-45 minutes. - Scheduled follow-up appointment with primary care physician  in 4-5 weeks to reassess TSH levels and adjust medication dosage if necessary. - Continue with endocrinology appointment in January for further evaluation and management.     I am having [name] start on levothyroxine . I am also having her maintain her ranolazine, nitroGLYCERIN , amLODipine , ondansetron , and albuterol .  Meds ordered this encounter  Medications   levothyroxine  (SYNTHROID ) 25 MCG tablet    Sig: Take 1 tablet (25 mcg total) by mouth daily.    Dispense:  30 tablet    Refill:  1    Supervising Provider:   ROLLENE NORRIS A [[id number]]

## 2024-08-12 NOTE — Telephone Encounter (Signed)
 1st attempt to reach pt in regards to the following information  Please take levothyroxine  that Vickie has prescribed. Office visit of problems Thank you

## 2024-08-12 NOTE — Telephone Encounter (Signed)
 Please take levothyroxine  that Vickie has prescribed. Office visit of problems Thank you

## 2024-10-07 ENCOUNTER — Ambulatory Visit: Payer: PRIVATE HEALTH INSURANCE | Admitting: Internal Medicine
